# Patient Record
Sex: Female | Born: 1955 | Race: White | Hispanic: No | Marital: Married | Smoking: Former smoker
Health system: Southern US, Community
[De-identification: ages and names within clinical notes are randomized; demographics above are authoritative.]

## PROBLEM LIST (undated history)

## (undated) DIAGNOSIS — M1990 Unspecified osteoarthritis, unspecified site: Secondary | ICD-10-CM

## (undated) DIAGNOSIS — C801 Malignant (primary) neoplasm, unspecified: Secondary | ICD-10-CM

## (undated) DIAGNOSIS — E039 Hypothyroidism, unspecified: Secondary | ICD-10-CM

## (undated) DIAGNOSIS — J45909 Unspecified asthma, uncomplicated: Secondary | ICD-10-CM

## (undated) DIAGNOSIS — R0600 Dyspnea, unspecified: Secondary | ICD-10-CM

## (undated) DIAGNOSIS — D649 Anemia, unspecified: Secondary | ICD-10-CM

## (undated) DIAGNOSIS — Z8719 Personal history of other diseases of the digestive system: Secondary | ICD-10-CM

## (undated) DIAGNOSIS — F419 Anxiety disorder, unspecified: Secondary | ICD-10-CM

## (undated) DIAGNOSIS — K219 Gastro-esophageal reflux disease without esophagitis: Secondary | ICD-10-CM

## (undated) DIAGNOSIS — E049 Nontoxic goiter, unspecified: Secondary | ICD-10-CM

## (undated) DIAGNOSIS — T7840XA Allergy, unspecified, initial encounter: Secondary | ICD-10-CM

## (undated) DIAGNOSIS — F32A Depression, unspecified: Secondary | ICD-10-CM

## (undated) DIAGNOSIS — I10 Essential (primary) hypertension: Secondary | ICD-10-CM

## (undated) DIAGNOSIS — E0590 Thyrotoxicosis, unspecified without thyrotoxic crisis or storm: Secondary | ICD-10-CM

## (undated) HISTORY — PX: TOTAL HIP ARTHROPLASTY: SHX124

## (undated) HISTORY — PX: KNEE SURGERY: SHX244

## (undated) HISTORY — PX: CHOLECYSTECTOMY: SHX55

## (undated) HISTORY — PX: WISDOM TOOTH EXTRACTION: SHX21

## (undated) HISTORY — PX: HAMMER TOE SURGERY: SHX385

## (undated) HISTORY — PX: JOINT REPLACEMENT: SHX530

## (undated) HISTORY — DX: Allergy, unspecified, initial encounter: T78.40XA

## (undated) HISTORY — PX: SHOULDER SURGERY: SHX246

## (undated) HISTORY — PX: FOOT SURGERY: SHX648

---

## 2002-09-20 ENCOUNTER — Encounter: Payer: Self-pay | Admitting: Family Medicine

## 2002-09-20 ENCOUNTER — Ambulatory Visit (HOSPITAL_COMMUNITY): Admission: RE | Admit: 2002-09-20 | Discharge: 2002-09-20 | Payer: Self-pay | Admitting: Family Medicine

## 2003-02-08 ENCOUNTER — Encounter: Admission: RE | Admit: 2003-02-08 | Discharge: 2003-02-08 | Payer: Self-pay | Admitting: Orthopedic Surgery

## 2004-01-19 ENCOUNTER — Other Ambulatory Visit: Admission: RE | Admit: 2004-01-19 | Discharge: 2004-01-19 | Payer: Self-pay | Admitting: Family Medicine

## 2004-07-20 ENCOUNTER — Ambulatory Visit (HOSPITAL_COMMUNITY): Admission: RE | Admit: 2004-07-20 | Discharge: 2004-07-20 | Payer: Self-pay | Admitting: Family Medicine

## 2004-10-01 ENCOUNTER — Encounter: Admission: RE | Admit: 2004-10-01 | Discharge: 2004-10-01 | Payer: Self-pay | Admitting: Orthopedic Surgery

## 2005-02-15 ENCOUNTER — Inpatient Hospital Stay (HOSPITAL_COMMUNITY): Admission: RE | Admit: 2005-02-15 | Discharge: 2005-02-16 | Payer: Self-pay | Admitting: Orthopedic Surgery

## 2005-04-12 ENCOUNTER — Other Ambulatory Visit: Admission: RE | Admit: 2005-04-12 | Discharge: 2005-04-12 | Payer: Self-pay | Admitting: Family Medicine

## 2005-07-22 ENCOUNTER — Ambulatory Visit (HOSPITAL_COMMUNITY): Admission: RE | Admit: 2005-07-22 | Discharge: 2005-07-22 | Payer: Self-pay | Admitting: Family Medicine

## 2006-05-01 ENCOUNTER — Other Ambulatory Visit: Admission: RE | Admit: 2006-05-01 | Discharge: 2006-05-01 | Payer: Self-pay | Admitting: Family Medicine

## 2006-08-05 ENCOUNTER — Ambulatory Visit (HOSPITAL_COMMUNITY): Admission: RE | Admit: 2006-08-05 | Discharge: 2006-08-05 | Payer: Self-pay | Admitting: Family Medicine

## 2007-07-28 ENCOUNTER — Other Ambulatory Visit: Admission: RE | Admit: 2007-07-28 | Discharge: 2007-07-28 | Payer: Self-pay | Admitting: Family Medicine

## 2007-08-06 ENCOUNTER — Ambulatory Visit (HOSPITAL_COMMUNITY): Admission: RE | Admit: 2007-08-06 | Discharge: 2007-08-06 | Payer: Self-pay | Admitting: Family Medicine

## 2008-05-03 ENCOUNTER — Encounter: Admission: RE | Admit: 2008-05-03 | Discharge: 2008-05-03 | Payer: Self-pay | Admitting: Orthopedic Surgery

## 2008-07-01 ENCOUNTER — Inpatient Hospital Stay (HOSPITAL_COMMUNITY): Admission: RE | Admit: 2008-07-01 | Discharge: 2008-07-03 | Payer: Self-pay | Admitting: Orthopedic Surgery

## 2008-08-08 ENCOUNTER — Ambulatory Visit (HOSPITAL_COMMUNITY): Admission: RE | Admit: 2008-08-08 | Discharge: 2008-08-08 | Payer: Self-pay | Admitting: Family Medicine

## 2008-12-15 ENCOUNTER — Other Ambulatory Visit: Admission: RE | Admit: 2008-12-15 | Discharge: 2008-12-15 | Payer: Self-pay | Admitting: Family Medicine

## 2009-08-21 ENCOUNTER — Ambulatory Visit (HOSPITAL_COMMUNITY): Admission: RE | Admit: 2009-08-21 | Discharge: 2009-08-21 | Payer: Self-pay | Admitting: Family Medicine

## 2010-03-29 ENCOUNTER — Other Ambulatory Visit: Payer: Self-pay | Admitting: Family Medicine

## 2010-03-29 ENCOUNTER — Other Ambulatory Visit
Admission: RE | Admit: 2010-03-29 | Discharge: 2010-03-29 | Payer: Self-pay | Source: Home / Self Care | Admitting: Family Medicine

## 2010-06-12 LAB — BASIC METABOLIC PANEL
BUN: 3 mg/dL — ABNORMAL LOW (ref 6–23)
CO2: 26 mEq/L (ref 19–32)
Calcium: 8.1 mg/dL — ABNORMAL LOW (ref 8.4–10.5)
Chloride: 102 mEq/L (ref 96–112)
Creatinine, Ser: 0.69 mg/dL (ref 0.4–1.2)
GFR calc Af Amer: >60 mL/min (ref >60)
GFR calc non Af Amer: >60 mL/min (ref >60)
Glucose, Bld: 130 mg/dL — ABNORMAL HIGH (ref 70–99)
Potassium: 3.7 mEq/L (ref 3.5–5.1)
Sodium: 134 mEq/L — ABNORMAL LOW (ref 135–145)

## 2010-06-12 LAB — CBC
HCT: 34.4 % — ABNORMAL LOW (ref 36.0–46.0)
Hemoglobin: 12 g/dL (ref 12.0–15.0)
MCHC: 35 g/dL (ref 30.0–36.0)
MCV: 91 fL (ref 78.0–100.0)
Platelets: 257 10*3/uL (ref 150–400)
RBC: 3.78 MIL/uL — ABNORMAL LOW (ref 3.87–5.11)
RDW: 12.9 % (ref 11.5–15.5)
WBC: 12.4 10*3/uL — ABNORMAL HIGH (ref 4.0–10.5)

## 2010-06-13 LAB — URINALYSIS, ROUTINE W REFLEX MICROSCOPIC
Bilirubin Urine: NEGATIVE
Glucose, UA: NEGATIVE mg/dL
Ketones, ur: NEGATIVE mg/dL
Nitrite: NEGATIVE
Protein, ur: NEGATIVE mg/dL
Specific Gravity, Urine: 1.026 (ref 1.005–1.030)
Urobilinogen, UA: 0.2 mg/dL (ref 0.0–1.0)
pH: 5 (ref 5.0–8.0)

## 2010-06-13 LAB — BASIC METABOLIC PANEL
BUN: 14 mg/dL (ref 6–23)
CO2: 28 mEq/L (ref 19–32)
Calcium: 9.6 mg/dL (ref 8.4–10.5)
Chloride: 103 mEq/L (ref 96–112)
Creatinine, Ser: 0.68 mg/dL (ref 0.4–1.2)
GFR calc Af Amer: >60 mL/min (ref >60)
GFR calc non Af Amer: >60 mL/min (ref >60)
Glucose, Bld: 90 mg/dL (ref 70–99)
Potassium: 3.8 mEq/L (ref 3.5–5.1)
Sodium: 139 mEq/L (ref 135–145)

## 2010-06-13 LAB — CBC
HCT: 39.3 % (ref 36.0–46.0)
Hemoglobin: 13.9 g/dL (ref 12.0–15.0)
MCHC: 35.2 g/dL (ref 30.0–36.0)
MCV: 90.4 fL (ref 78.0–100.0)
Platelets: 296 10*3/uL (ref 150–400)
RBC: 4.35 MIL/uL (ref 3.87–5.11)
RDW: 13 % (ref 11.5–15.5)
WBC: 10 10*3/uL (ref 4.0–10.5)

## 2010-06-13 LAB — DIFFERENTIAL
Basophils Absolute: 0.1 10*3/uL (ref 0.0–0.1)
Basophils Relative: 1 % (ref 0–1)
Eosinophils Absolute: 0.4 10*3/uL (ref 0.0–0.7)
Eosinophils Relative: 4 % (ref 0–5)
Lymphocytes Relative: 23 % (ref 12–46)
Lymphs Abs: 2.3 10*3/uL (ref 0.7–4.0)
Monocytes Absolute: 0.9 10*3/uL (ref 0.1–1.0)
Monocytes Relative: 9 % (ref 3–12)
Neutro Abs: 6.5 10*3/uL (ref 1.7–7.7)
Neutrophils Relative %: 64 % (ref 43–77)

## 2010-06-13 LAB — TYPE AND SCREEN
ABO/RH(D): O NEG
Antibody Screen: NEGATIVE

## 2010-06-13 LAB — URINE MICROSCOPIC-ADD ON

## 2010-06-13 LAB — PROTIME-INR
INR: 1 (ref 0.00–1.49)
Prothrombin Time: 12.8 seconds (ref 11.6–15.2)

## 2010-06-13 LAB — ABO/RH: ABO/RH(D): O NEG

## 2010-06-13 LAB — APTT: aPTT: 26 seconds (ref 24–37)

## 2010-07-13 ENCOUNTER — Encounter (HOSPITAL_COMMUNITY)
Admission: RE | Admit: 2010-07-13 | Discharge: 2010-07-13 | Disposition: A | Discharge status: Home / Self Care | Payer: BC Managed Care – PPO | Source: Ambulatory Visit | Attending: Orthopedic Surgery | Admitting: Orthopedic Surgery

## 2010-07-13 LAB — URINALYSIS, ROUTINE W REFLEX MICROSCOPIC
Ketones, ur: NEGATIVE mg/dL
Nitrite: NEGATIVE
Protein, ur: NEGATIVE mg/dL

## 2010-07-13 LAB — CBC
MCV: 91.7 fL (ref 78.0–100.0)
Platelets: 317 10*3/uL (ref 150–400)
RBC: 4.57 MIL/uL (ref 3.87–5.11)
RDW: 13.6 % (ref 11.5–15.5)
WBC: 9 10*3/uL (ref 4.0–10.5)

## 2010-07-13 LAB — APTT: aPTT: 26 seconds (ref 24–37)

## 2010-07-13 LAB — BASIC METABOLIC PANEL
BUN: 14 mg/dL (ref 6–23)
Calcium: 9.8 mg/dL (ref 8.4–10.5)
Chloride: 100 mEq/L (ref 96–112)
Creatinine, Ser: 0.72 mg/dL (ref 0.4–1.2)
GFR calc Af Amer: >60 mL/min (ref >60)

## 2010-07-13 LAB — PROTIME-INR
INR: 0.86 (ref 0.00–1.49)
Prothrombin Time: 11.9 seconds (ref 11.6–15.2)

## 2010-07-13 LAB — DIFFERENTIAL
Basophils Absolute: 0.1 10*3/uL (ref 0.0–0.1)
Basophils Relative: 1 % (ref 0–1)
Eosinophils Absolute: 0.4 10*3/uL (ref 0.0–0.7)
Eosinophils Relative: 4 % (ref 0–5)
Neutrophils Relative %: 64 % (ref 43–77)

## 2010-07-13 LAB — SURGICAL PCR SCREEN: Staphylococcus aureus: NEGATIVE

## 2010-07-13 LAB — TYPE AND SCREEN

## 2010-07-17 NOTE — Op Note (Signed)
NAMESHERLEY, MCKENNEY               ACCOUNT NO.:  [id number]   MEDICAL RECORD NO.:  [id number]          PATIENT TYPE:  INP   LOCATION:  2899                         FACILITY:  [facility]   PHYSICIAN:  Almedia Balls. Ranell Patrick, M.D. DATE OF BIRTH:  [birth year]   DATE OF PROCEDURE:  07/01/2008  DATE OF DISCHARGE:                               OPERATIVE REPORT   PREOPERATIVE DIAGNOSIS:  Right shoulder end-stage osteoarthritis.   POSTOPERATIVE DIAGNOSIS:  Right shoulder end-stage osteoarthritis.   PROCEDURE PERFORMED:  Right total shoulder replacement using DePuy  Global Advantage system.   ATTENDING SURGEON:  Almedia Balls. Ranell Patrick, MD   ASSISTANT:  Donnie Coffin. Dixon, PA-C   ANESTHESIA:  General anesthesia plus interscalene block anesthesia was  used.   ESTIMATED BLOOD LOSS:  Minimal.   FLUID REPLACEMENT:  1800 mL crystalloid.   INSTRUMENT COUNTS:  Correct.   COMPLICATIONS:  None.   Perioperative antibiotics were given.   URINE OUTPUT:  350 mL.   INDICATIONS:  The patient is a 55 year old female with a worse history  of bilateral shoulder arthritis.  She has had a prior shoulder  arthroplasty in the left and done well with that.  She presents now with  disabling pain and functional loss desiring shoulder replacement on the  right.  Informed consent was obtained.   DESCRIPTION OF PROCEDURE:  After an adequate level of anesthesia was  achieved, the patient was positioned in the modified beach chair  position.  Right shoulder was examined under Anesthesia.  The patient  had about of 10 degrees of external rotation with firm end point,  internal rotation to the abdomen barely, forward elevation about 100  degrees with stiffness.  At this point, we sterilely prepped and draped  the shoulder in the usual manner.  We entered the shoulder through a  deltopectoral approach.  We preinfiltrated the skin with 0.25% Marcaine  with epinephrine.  We identified the deltopectoral interval in the  cephalic  vein.  We took cephalic vein laterally with the deltoid.  There  were some large crossing vessels that we ligated using suture ligature.  At this point, we went ahead and released the upper portion of the  pectoralis.  We then mobilized the conjoint tendon medially exposing the  3 sisters.  We then went ahead and thoroughly tied off those 3 sisters  and then did a release of the subscapularis off the lesser tuberosity.  We placed two #2  FiberWire sutures in a modified Mason-Allen suture  technique into the free end of the tendon.  We released that off the  capsule inferiorly and then off the humeral neck.  We progressively  externally rotated the humeral neck until we could fully deliver that  into the wound and then performed her neck cut osteotomies using  oscillating saw with the neck cut guide.  We cut the humerus in about 10  degrees of external rotation giving [place] 10 degrees of retroversion on her  cut.  At this point, we prepared her humerus initially reaming up to a  size of 10.  We then went ahead and started  broaching with the size 8  then went to a size 10.  The interesting thing in her situation was that  as we fully impacted the 10 broach down to level with her osteotomy, the  broach collar was kicked into varus.  We looked at the x-rays and it  appeared that the cortex came in fairly aggressively from lateral to  medial coming off the greater tuberosity and then she narrowed down  distally, and I think this threw [place] into  varus. Thus we went ahead and  selected a size 8.  We could position that perfectly and will just  cement that in place.  With the broach in place protecting her proximal  humerus, we went ahead and did a sequential release 360 degrees around  the glenoid.  We were able to gain good access to the glenoid and then  went ahead and drilled a center hole feeling for the inferior pillar and  marking her glenoid appropriately with 12 o'clock, 6 o'clock, 3 and 9   positions and her central hole was directly in the middle of that. We  then sized it to size 40, we reamed down to bleeding bone and then  placed the anchor-peg drill holes in place, and then placed her for  trial 40 anchor-peg glenoid in there.  This fit nicely and had good  support all the way round.  We removed excess osteophytes around the  edge using a rongeur.  At this point, we went ahead and cemented our  stem into place and placing #2 FiberWire through drill holes for a  subscapular repair and then we went ahead and cemented her component in  the place with careful positioning both in 10 degrees of retroversion as  well as making sure that stent did not drift into varus at all.  Once  the cement was allowed to harden, we went ahead then cemented her  peripheral Pegs on her glenoid, removed her trial component, and we  cemented her anchor peg glenoid, did not cement the central hole to  allow for ingrowth that again had good support and was nice and flushed  with the native glenoid.  At this point, we trialed again with the 40.  We had trialed before with a 40, 18 head and that fitted perfectly and  again we trialed with 40, 18 and this gave [place] good offset.  We then went  ahead and thoroughly irrigated, checked for excess cement and then  repaired her subscapularis anatomically with #2 FiberWire sutures.  We  placed 3 of those sutures up in the rotator interval and had an anatomic  repair.  It was very strong, we tenodesed of the biceps with 0 Vicryl  suture and then thoroughly irrigated closing the deltopectoral interval  with 0 Vicryl suture followed by 2-0 Vicryl subcutaneous closure and 4-0  Monocryl for skin.  Steri-Strips were applied followed by a sterile  dressing.  The patient tolerated the surgery well.      Almedia Balls. Ranell Patrick, M.D.  Electronically Signed     SRN/MEDQ  D:  07/01/2008  T:  07/02/2008  Job:  [id number]

## 2010-07-17 NOTE — H&P (Signed)
[name], [name]               ACCOUNT NO.:  [id number]   MEDICAL RECORD NO.:  [id number]          PATIENT TYPE:  INP   LOCATION:  NA                           FACILITY:  [facility]   PHYSICIAN:  Almedia Balls. Ranell Patrick, M.D. DATE OF BIRTH:  February 02, 1956   DATE OF ADMISSION:  DATE OF DISCHARGE:                              HISTORY & PHYSICAL   CHIEF COMPLAINT:  Right shoulder pain.   HISTORY OF PRESENT ILLNESS:  The patient is a 55 year old female with  worsening right shoulder pain secondary to osteoarthritis.  The patient  has elected to have a right total shoulder arthroplasty by Dr. Malon Kindle.   PAST MEDICAL HISTORY:  1. Asthma.  2. Hypertension.   Family medical history is diabetes, coronary artery disease, and  hypertension.   SOCIAL HISTORY:  The patient is married.  Occasional alcohol use.  Does  not smoke or use recreational drugs.  The patient is of Dr. Kevan Ny'.   DRUG ALLERGIES:  None.   CURRENT MEDICATIONS:  Advair, Synthroid, hydrochlorothiazide, birth  control pills, Zyrtec, and albuterol.  Please check the dosages with the  patient.   REVIEW OF SYSTEMS:  Pain on range of motion with her right shoulder.   PHYSICAL EXAMINATION:  VITAL SIGNS:  Pulse 90, respirations 16, blood  pressure 142/92.  GENERAL:  The patient is a healthy-appearing 55 year old female in no  acute distress, pleasant, and affect, oriented x3.  HEAD AND NECK:  Cranial nerves II through XII grossly intact.  She has  full range of motion without any tenderness of cervical, thoracic, or  lumbar spines.  CHEST:  She has active breath sounds bilaterally.  No wheezes, rhonchi,  or rales.  HEART:  Regular rate and rhythm.  No murmur.  ABDOMEN:  Nontender, nondistended with active bowel sounds.  EXTREMITIES:  Moderate tenderness in the right shoulder, especially with  range of motion, forward flexion to about 80 degrees, external rotation  20 degrees, internal rotation without any crepitus.  NEUROLOGICAL:  She is intact.  SKIN:  No edema or rashes.   X-rays show end-stage osteoarthritis of the right shoulder.   IMPRESSION:  Right shoulder end-stage osteoarthritis.   PLAN OF ACTION:  She will have a right total shoulder arthroplasty by  Dr. Malon Kindle.      Thomas B. Durwin Nora, P.A.      Almedia Balls. Ranell Patrick, M.D.  Electronically Signed    TBD/MEDQ  D:  06/21/2008  T:  06/22/2008  Job:  [id number]

## 2010-07-17 NOTE — Discharge Summary (Signed)
NAMELANEICE, MENEELY               ACCOUNT NO.:  [id number]   MEDICAL RECORD NO.:  [id number]          PATIENT TYPE:  INP   LOCATION:  5007                         FACILITY:  [facility]   PHYSICIAN:  Almedia Balls. Ranell Patrick, M.D. DATE OF BIRTH:  1955-11-30   DATE OF ADMISSION:  07/01/2008  DATE OF DISCHARGE:  07/03/2008                               DISCHARGE SUMMARY   ADMITTING DIAGNOSIS:  Right shoulder end-stage osteoarthritis.   DISCHARGE DIAGNOSIS:  Right shoulder end-stage osteoarthritis.   PROCEDURE PERFORMED:  Right total shoulder replacement performed on  July 01, 2008 by Dr. Ranell Patrick.   CONSULTANT SERVICE:  Occupational Therapy for discharge planning.   HISTORY OF PRESENT ILLNESS:  The patient is a 55 year old female with a  history of worsening right shoulder pain secondary to end-stage  arthritis.  The patient had prior left shoulder placement done well.  She presents now for treatment to restore function and eliminate pain.  For further details of the patient's past medical history and physical  examination, please see the hospital record.   HOSPITAL COURSE:  The patient was admitted to Orthopedics on July 01, 2008.  The patient underwent uncomplicated right total shoulder  arthroplasty.  The patient was transferred to the floor postop, doing  well.  The patient was on a regular diet, tolerating her p.o. meds well  and pain was well-controlled on p.o. meds on day of discharge.  The  patient is discharged on Dilaudid, tramadol, Robaxin, and Dulcolax  suppositories p.r.n.  She is also on her preadmission medications with  exception of the hydrocodone which she will refrain from.  She is  instructed on daily dry dressing change keeping the wound dry for 7 days  with followup with Orthopedics in 2 weeks.      Almedia Balls. Ranell Patrick, M.D.  Electronically Signed     SRN/MEDQ  D:  07/03/2008  T:  07/03/2008  Job:  [id number]

## 2010-07-20 ENCOUNTER — Inpatient Hospital Stay (HOSPITAL_COMMUNITY)
Admission: RE | Admit: 2010-07-20 | Discharge: 2010-07-23 | DRG: 209 | Disposition: A | Discharge status: Home / Self Care | Payer: BC Managed Care – PPO | Source: Ambulatory Visit | Attending: Orthopedic Surgery | Admitting: Orthopedic Surgery

## 2010-07-20 DIAGNOSIS — I10 Essential (primary) hypertension: Secondary | ICD-10-CM | POA: Diagnosis present

## 2010-07-20 DIAGNOSIS — E039 Hypothyroidism, unspecified: Secondary | ICD-10-CM | POA: Diagnosis present

## 2010-07-20 DIAGNOSIS — M1710 Unilateral primary osteoarthritis, unspecified knee: Principal | ICD-10-CM | POA: Diagnosis present

## 2010-07-20 NOTE — Op Note (Signed)
NAMENETA, UPADHYAY               ACCOUNT NO.:  [id number]   MEDICAL RECORD NO.:  [id number]          PATIENT TYPE:  INP   LOCATION:  0004                         [facility]ACILITY[facility]   PHYSICIAN:  Almedia Balls. Ranell Patrick, M.D. DATE OF BIRTH:  February 28, 1956   DATE OF PROCEDURE:  02/15/2005  DATE OF DISCHARGE:                                 OPERATIVE REPORT   PREOPERATIVE DIAGNOSES:  Left shoulder osteoarthritis, severe.   POSTOPERATIVE DIAGNOSES:  Left shoulder osteoarthritis, severe.   PROCEDURE:  Left shoulder hemiarthroplasty using DePuy global prosthesis  with a size 10 body and a 38 x 18 head.   SURGEON:  Almedia Balls. Ranell Patrick, M.D.   ASSISTANT:  Donnie Coffin. Durwin Nora, P.A.   ANESTHESIA:  General anesthesia was used plus an interscalene block.   FLUIDS REPLACED:  1500 mL.   ESTIMATED BLOOD LOSS:  200 mL.   INSTRUMENT COUNT:  Correct.   COMPLICATIONS:  None.   ANTIBIOTICS:  Perioperative antibiotics were given.   INDICATIONS FOR PROCEDURE:  The patient is a 55 year old female with a  history of worsening left shoulder pain. She is noted to have end-stage  arthritis on x-ray. She has severely limited range of motion and function as  a result of her arthritis and presents now for operative shoulder  arthroplasty to relieve pain and improve shoulder function. We discussed  with the patient the options of management and she elected to proceed with  surgical treatment. Informed consent was obtained.   DESCRIPTION OF PROCEDURE:  After an adequate level of anesthesia was  achieved, the patient was positioned in modified beach position. All  neurovascular structures padded appropriately. The left shoulder was  sterilely prepped and draped in the usual manner. A deltopectoral approach  was created after a sterile prep and drape. Starting at the coracoid process  and extending down to the anterior attachment of the deltoid, dissection was  carried sharply down through the subcutaneous tissues. The  cephalic vein  identified and taken laterally with the deltoid. The upper 1 cm peck was  released off the humerus, conjoined tendon identified and taken medially.  This shows the subscapularis which was taken off the lesser tuberosity. We  attempted to leave a little cuff of soft tissue although not a big one as  the patient's external rotation was limited to about 5 degrees if that. Once  the subscap was off, we placed definitive Mason-Allen sutures, these were #2  FiberWire sutures in the subscap and we freed it up off the capsule. We  performed an anterior capsulectomy. Once this was performed, we  progressively externally rotated and delivered the head. We placed a freer  elevator over the top of the head underneath the biceps tendon and the  rotator cuff. We then performed a neck cut using the appropriate neck cut  guide and placing the elbow at approximately 20 degrees of external  rotation. Once this was done, we removed the excess osteophytes off the  humerus, we also removed the large loose body from the posterior aspect of  the shoulder capsule as well as the inferior aspect of  the shoulder capsule  and these were removed. The remainder of the capsule was swept with my  finger and was felt to be free of any large loose bodies. The posterior  capsule seemed to be fairly patulous and I did not feel a need to release  that. Following this, we repaired the humerus with sequential reaming  starting at a size 6 up to a size 10 and then broached up to a size 10 and  with the trial 10 broach in place, we placed a 44 x 18 head on. We did not  __________ for coverage. This seemed to be an appropriate size. We then  reduced the shoulder and noted outstanding soft tissue tension with being  able to inferiorly translate 50% of the glenoid diameter and posteriorly  translate 50% of the glenoid diameter. We removed the trial implants and  then impacted using some bone grafting around the upper  portion of the  humerus. I impacted the real 10 stem in place. We again trialed with a 44 x  18 trial and again had nice coverage and good soft tissue tension. We  removed that trial head and then impacted the real head in place, a 44 x 18.  We then went to repair the subscap and initially made some drill holes in  the lesser tuberosity and placed the sutures through that tying the upper  two sutures. The bone bridge between the first 2 sutures actually broke off  on the lesser tuberosity thus we needed to go ahead and elevate the biceps  tendon out of the bicipital groove and then place drill holes in the floor  of the bicipital groove and bring the sutures through that. Once we were  able to do that, we had nice secure subscap repair with multiple horizontal  mattress sutures through either the lesser tuberosity or the bicipital  groove floor. The biceps tendon was laid back on top of those sutures and  was in its anatomic location. At this point, we went ahead and irrigated the  wound thoroughly and then repaired the deltopectoral interval with  interrupted #1 Vicryl suture followed by 2-0 Vicryl subcutaneous and running  4-0 Monocryl for skin. Steri-Strips were applied followed by a sterile  dressing. The patient tolerated surgery well.           ______________________________  Almedia Balls. Ranell Patrick, M.D.     SRN/MEDQ  D:  02/15/2005  T:  02/18/2005  Job:  [id number]

## 2010-07-20 NOTE — H&P (Signed)
[name], [name]               ACCOUNT NO.:  [id number]   MEDICAL RECORD NO.:  [id number]          PATIENT TYPE:   LOCATION:                                 FACILITY:   PHYSICIAN:  Maisie Fus B. Dixon, P.A.  DATE OF BIRTH:  09/23/1955   DATE OF ADMISSION:  02/15/2005  DATE OF DISCHARGE:                                HISTORY & PHYSICAL   CHIEF COMPLAINT:  Left shoulder pain.   HISTORY OF PRESENT ILLNESS:  The patient has had left shoulder pain for  greater than 2 years with worsening symptoms.  The patient is a left hand  dominant female and her symptoms have been refractory to conservative  methods such as cortisone and physical therapy.   ALLERGIES:  No known drug allergies.   MEDICATIONS:  1.  Synthroid 0.1 mg daily.  2.  Hydrochlorothiazide 12.5 mg daily.  3.  Ortho-Novum 7/7/7 daily.  4.  Advair 250/50 mg daily.  5.  Vicodin 5/500 one tablet q.4-6 hours p.r.n. pain.  6.  Robaxin 500 mg q.6 hours p.r.n. pain.   PAST MEDICAL HISTORY:  Asthma, hypertension, and hypothyroidism.   FAMILY HISTORY:  Diabetes, coronary artery disease, and hypertension.   SOCIAL HISTORY:  Patient of Duncan Dull, M.D.  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   REVIEW OF SYSTEMS:  Negative except for pain with range of motion in the  left shoulder.   PHYSICAL EXAMINATION:  VITAL SIGNS:  Pulse 90, respirations 18, blood  pressure 142/92.  GENERAL:  The patient is a healthy appearing 55 year old female in no acute  distress.  Pleasant mood and affect.  Alert and oriented x3.  HEENT:  NECK:  Cranial nerves II-XII grossly intact.  She has full range of  motion of the cervical spine with no tenderness to the cervical, thoracic,  or lumbar spines.  She has negative Spurling's and Lhermitte's test.  CHEST:  Active breath sounds bilaterally with no wheezes, rhonchi, or rales.  HEART:  Regular rate and rhythm, no murmur.  ABDOMEN:  Nontender and nondistended with active bowel  sounds in all four  quadrants.  EXTREMITIES:  Marked tenderness and crepitus with the left shoulder.  She  has forward flexion up to 90 degrees, external rotation to 15 degrees and  internal rotation to her abdomen.  Neurovascularly she is intact.  Skin  intact.  She has grip strength 5/5 bilaterally.  Strength of biceps and  triceps is 5/5 bilaterally.  Legs are unremarkable with strength 5/5  bilaterally and deep tendon reflexes are 2+ and equal.  No rashes were seen  on her skin examination.   X-ray shows left shoulder osteoarthritis which is severe.   IMPRESSION:  Left shoulder osteoarthritis, severe.   PLAN:  Left shoulder hemiarthroplasty by Almedia Balls. Ranell Patrick, M.D. on February 15, 2005.      Thomas B. Ferne Coe.     TBD/MEDQ  D:  01/29/2005  T:  01/29/2005  Job:  [id number]

## 2010-07-21 LAB — CBC
HCT: 32.5 % — ABNORMAL LOW (ref 36.0–46.0)
Hemoglobin: 11 g/dL — ABNORMAL LOW (ref 12.0–15.0)
MCHC: 33.8 g/dL (ref 30.0–36.0)
MCV: 91.8 fL (ref 78.0–100.0)

## 2010-07-21 LAB — BASIC METABOLIC PANEL
BUN: 8 mg/dL (ref 6–23)
CO2: 30 mEq/L (ref 19–32)
Calcium: 8.4 mg/dL (ref 8.4–10.5)
Glucose, Bld: 102 mg/dL — ABNORMAL HIGH (ref 70–99)
Potassium: 4.1 mEq/L (ref 3.5–5.1)
Sodium: 137 mEq/L (ref 135–145)

## 2010-07-22 LAB — BASIC METABOLIC PANEL
CO2: 29 mEq/L (ref 19–32)
Calcium: 8.7 mg/dL (ref 8.4–10.5)
Creatinine, Ser: 0.61 mg/dL (ref 0.4–1.2)
Glucose, Bld: 155 mg/dL — ABNORMAL HIGH (ref 70–99)

## 2010-07-22 LAB — CBC
HCT: 34.9 % — ABNORMAL LOW (ref 36.0–46.0)
Hemoglobin: 11.7 g/dL — ABNORMAL LOW (ref 12.0–15.0)
MCH: 30.6 pg (ref 26.0–34.0)
MCHC: 33.5 g/dL (ref 30.0–36.0)

## 2010-07-23 NOTE — Op Note (Signed)
NAMEIZORA, BENN               ACCOUNT NO.:  [id number]  MEDICAL RECORD NO.:  [id number]           PATIENT TYPE:  I  LOCATION:  5024                         FACILITY:  [facility]  PHYSICIAN:  Madlyn Frankel. Charlann Boxer, M.D.  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  SURGEON:  Madlyn Frankel. Charlann Boxer, MD  ASSISTANTS: 1. Almedia Balls. Ranell Patrick, MD 2. Jaquelyn Bitter. Chabon, PA-C  ANESTHESIA:  General.  SPECIMENS:  None.  COMPLICATIONS:  None.  DRAINS:  One Hemovac.  TOURNIQUET TIME:  48 minutes at 250 mmHg.  BLOOD LOSS:  About 100 mL.  INDICATIONS FOR PROCEDURE:  Ms. Freedman is a very pleasant 55 year old patient of Dr. Malon Kindle with a remote history of a patellar realignment with tibial tubercle osteotomy and realignment procedure for patellofemoral arthritis.  She had progressively failed conservative measures including series of viscous supplementation, cortisone injections, activity modifications and standard medications.  She ultimately decided to proceed with arthroplasty.  Dr. Ranell Patrick had reviewed with her the risks and benefits of procedure in the fact that he wished to have assistance indication for myself.  Risks and benefits reviewed.  Consent was obtained for the benefit of pain relief.  Standard risks of infection, DVT, component failure, need for revision surgery.  All have discussed and reviewed in addition to the complications related to her previous surgery.  PROCEDURE IN DETAIL:  The patient was brought to operative  theater. Once adequate anesthesia and preoperative antibiotics, Ancef administered.  She was positioned supine with left thigh tourniquet placed.  The left lower extremity was then examined under anesthesia and found to have hyperextension of at least 5-7 degrees as well as just global slight instability as we came through flexion.  She was passively correctable out of her valgus to a neutral alignment as she had predominantly anterolateral disease radiographically.  At this point, left lower extremity was prepped and draped in a sterile fashion.  A time-out was performed identifying the patient, planned procedure and extremity.  She was noted to have a large S-type incision over the anterior aspect of the knee from her previously realignment procedure.  I basically utilized this and incision extended at more proximal.  We excised the old scar.  Leg was exsanguinated, tourniquet elevated to 250 mmHg.  The old incision was utilized excising old scar and extending it proximally for exposure purposes.  Soft tissue planes created.  Extensor mechanism visualized.  A median arthrotomy was then made allowing for lateral subluxation.  Following initial exposure and debridement some of the synovium fat in the anterior and medial aspect of the knee noted severe degenerative change within the knee laterally and patellofemoral with an associated beefy red synovitis.  The attention was directed to patella, precut  measurement of the patella was 20 mm.  I resected down to about 14 mm and used a 35 patellar button restore height.  This covered the cut surface well.  Lug holes were drilled and a metal shim was placed.  I also confirmed that the patella height was restored about 23 mm for female that is anatomic.  Attention was now directed to the femur.  The femoral canal was opened with a drill and irrigated to try to prevent fat emboli.  Using the intramedullary rod at 5 degrees of valgus and 9 mm  of bone resected off the distal femur due to her preoperative hyperextension passively. Following this cut, the tibia was subluxated anteriorly, further cruciate stumps and meniscus was debrided.  Using extramedullary guide based on her anatomy and her radiographs, 4 mm of bone was resected off the proximal and lateral tibia.  The most affected side, however, still radiographically relatively high.  This was then felt to be a conservative cut.  Following this cut, we confirmed the gap was stable at this point with a 10-mm insert with the knee in extension medially and laterally as well as confirmed that the cut was perpendicular in the coronal plane.  At this point, I sized the femur to be a size 4.  The size 4 rotation block was then pinned onto the distal femur anterior reference using the C-clamp to set rotation.  Following pinning this into place, the former cutting block was pinned into position.  Once it was oriented and flushed with the anterior cortex, the anterior- posterior chamfer cuts were made without difficulty, no notching.  The final box cut was made up to the lateral aspect of distal femur.  At this point, the tibia was subluxated again.  A final size 3 tibial tray fit best on the proximal tibia to reestablish anatomic boundaries of the knee.  She was noted to have a slightly externally rotated tibia from previous procedures.  The tibial tray was pinned in position, drilled and keel punched.  A trial reduction was now carried out with 4 narrow femur, 3 tibia and 10- mm insert.  With this, I felt the knee came to full extension and had stable ligaments from extension into flexion and the patella tracked through the trochlea with a little bit of lift-off and deep flexion.  At this point, all trial components were removed.  The final components were opened including the polyethylene insert.  The knee was irrigated with normal saline solution pulse lavage.  The cement  was mixed.  The final components were then cemented onto clean and dried cut surfaces. The bone and the knee was brought to extension with a 10-mm insert and extruded cement was removed.  Tourniquet was let down after 48 minutes at 250 mmHg.  Once the cement had cured and excessive cement was removed throughout the knee, the final 10-mm insert was chosen and placed into the knee.  We re-irrigated the knee at this point with normal saline solution.  At this point, attention was directed, reapproximated, extensor mechanism which I normally would do in flexion; however, based on the fact that her previous realignment left that was very thin anterior patella tendon, extensor tissue.  I focused a lot of the tension on the reapproximating this.  Not only was reapproximated using interrupted suture, did a running suture to try to tubularized this area to provide some extra tendinous tissue for healing.  The remainder of the extensor mechanism was reapproximated in typical  fashion using #1 Vicryl using a combination of interrupted figure-of- eight sutures as well as running sutures.  The remaining wound at this point was closed with 2-0 Vicryl and staples on the skin due to some of tension.  The skin was cleaned, dried and dressed sterilely using Mepilex dressings and bulky wrap.  She was then brought to recovery room in stable condition tolerating the procedure well.     Madlyn Frankel Charlann Boxer, M.D.     MDO/MEDQ  D:  07/20/2010  T:  07/21/2010  Job:  [id number]  Electronically Signed by Durene Romans M.D. on 07/23/2010 01:02:08 PM

## 2010-08-24 ENCOUNTER — Other Ambulatory Visit (HOSPITAL_COMMUNITY): Payer: Self-pay | Admitting: Family Medicine

## 2010-08-24 DIAGNOSIS — Z1231 Encounter for screening mammogram for malignant neoplasm of breast: Secondary | ICD-10-CM

## 2010-09-04 ENCOUNTER — Ambulatory Visit (HOSPITAL_COMMUNITY)
Admission: RE | Admit: 2010-09-04 | Discharge: 2010-09-04 | Disposition: A | Discharge status: Home / Self Care | Payer: BC Managed Care – PPO | Source: Ambulatory Visit | Attending: Family Medicine | Admitting: Family Medicine

## 2010-09-04 DIAGNOSIS — Z1231 Encounter for screening mammogram for malignant neoplasm of breast: Secondary | ICD-10-CM | POA: Insufficient documentation

## 2010-09-21 NOTE — H&P (Signed)
  [name], [name]               ACCOUNT NO.:  [id number]  MEDICAL RECORD NO.:  [id number]           PATIENT TYPE:  I  LOCATION:  [place]                         FACILITY:  [facility]  PHYSICIAN:  Almedia Balls. Ranell Patrick, M.D. DATE OF BIRTH:  04-02-1955  DATE OF ADMISSION:  07/10/2010 DATE OF DISCHARGE:                             HISTORY & PHYSICAL   CHIEF COMPLAINT:  Left knee pain.  This is a patient 55 year old female with worsening left knee pain secondary to end-stage osteoarthritis and deformity.  The patient was elected to have a total knee arthroplasty due to worsening pain.  PAST MEDICAL HISTORY:  Hypertension, hypothyroidism.  FAMILY MEDICAL HISTORY:  Hypertension.  SOCIAL HISTORY:  The patient has occasional alcohol use.  Does not smoke.  Patient of Dr. Shaune Pollack.  The patient does have an intolerance PERCOCET.  Medications currently are hydrochlorothiazide, Zoloft, birth control pills, Synthroid, and Advair.  Review of systems showed pain on range of motion of the left knee with palpable click.  PHYSICAL EXAMINATION:  Pulse 74, respirations 16, blood pressure 140/78. The patient is a healthy-appearing 55 year old female in no acute distress.  Pleasant mood and affect, alert and oriented x3.  Examination of the head and neck shows cranial nerves II through XII are grossly intact, shows full range of motion of the cervical, thoracic, and lumbar spines.  The neck shows full range of motion without any tenderness. Chest was active breath sounds bilaterally.  No wheezes, rhonchi, or rales.  Heart shows regular rate and rhythm.  No murmur.  Abdomen is nontender and nondistended.  Extremities show moderate tenderness of the left knee with medial joint line tenderness.  No popliteal mass.  Calves nontender.  No pedal edema.  She does have some mildly antalgic gait but no rashes or edema.  X-rays show end-stage osteoarthritis of the left knee.  IMPRESSION:  End-stage  osteoarthritis, left knee.  PLAN OF ACTION:  Left total knee arthroplasty by Dr. Malon Kindle and Dr. Durene Romans.     [name], P.A.   ______________________________ Almedia Balls. Ranell Patrick, M.D.    TBD/MEDQ  D:  07/10/2010  T:  07/10/2010  Job:  [id number]  Electronically Signed by Standley Dakins P.A. on 08/02/2010 08:30:06 AM Electronically Signed by Malon Kindle  on 09/21/2010 12:04:26 AM

## 2011-02-13 ENCOUNTER — Other Ambulatory Visit: Payer: Self-pay | Admitting: Gastroenterology

## 2011-09-04 ENCOUNTER — Other Ambulatory Visit (HOSPITAL_COMMUNITY): Payer: Self-pay | Admitting: Family Medicine

## 2011-09-04 DIAGNOSIS — Z1231 Encounter for screening mammogram for malignant neoplasm of breast: Secondary | ICD-10-CM

## 2011-09-06 ENCOUNTER — Ambulatory Visit (HOSPITAL_COMMUNITY)
Admission: RE | Admit: 2011-09-06 | Discharge: 2011-09-06 | Disposition: A | Discharge status: Home / Self Care | Payer: BC Managed Care – PPO | Source: Ambulatory Visit | Attending: Family Medicine | Admitting: Family Medicine

## 2011-09-06 DIAGNOSIS — Z1231 Encounter for screening mammogram for malignant neoplasm of breast: Secondary | ICD-10-CM | POA: Insufficient documentation

## 2012-09-29 ENCOUNTER — Other Ambulatory Visit (HOSPITAL_COMMUNITY): Payer: Self-pay | Admitting: Family Medicine

## 2012-09-29 DIAGNOSIS — Z1231 Encounter for screening mammogram for malignant neoplasm of breast: Secondary | ICD-10-CM

## 2012-10-09 ENCOUNTER — Ambulatory Visit (HOSPITAL_COMMUNITY)
Admission: RE | Admit: 2012-10-09 | Discharge: 2012-10-09 | Disposition: A | Discharge status: Home / Self Care | Payer: BC Managed Care – PPO | Source: Ambulatory Visit | Attending: Family Medicine | Admitting: Family Medicine

## 2012-10-09 DIAGNOSIS — Z1231 Encounter for screening mammogram for malignant neoplasm of breast: Secondary | ICD-10-CM | POA: Insufficient documentation

## 2013-06-19 ENCOUNTER — Emergency Department (HOSPITAL_COMMUNITY): Payer: BC Managed Care – PPO

## 2013-06-19 ENCOUNTER — Encounter (HOSPITAL_COMMUNITY): Payer: Self-pay | Admitting: Emergency Medicine

## 2013-06-19 ENCOUNTER — Emergency Department (HOSPITAL_COMMUNITY)
Admission: EM | Admit: 2013-06-19 | Discharge: 2013-06-19 | Disposition: A | Discharge status: Home / Self Care | Payer: BC Managed Care – PPO | Attending: Emergency Medicine | Admitting: Emergency Medicine

## 2013-06-19 DIAGNOSIS — S069X9A Unspecified intracranial injury with loss of consciousness of unspecified duration, initial encounter: Secondary | ICD-10-CM

## 2013-06-19 DIAGNOSIS — Y9301 Activity, walking, marching and hiking: Secondary | ICD-10-CM | POA: Insufficient documentation

## 2013-06-19 DIAGNOSIS — W108XXA Fall (on) (from) other stairs and steps, initial encounter: Secondary | ICD-10-CM | POA: Insufficient documentation

## 2013-06-19 DIAGNOSIS — R296 Repeated falls: Secondary | ICD-10-CM | POA: Insufficient documentation

## 2013-06-19 DIAGNOSIS — S2239XA Fracture of one rib, unspecified side, initial encounter for closed fracture: Secondary | ICD-10-CM

## 2013-06-19 DIAGNOSIS — Y92009 Unspecified place in unspecified non-institutional (private) residence as the place of occurrence of the external cause: Secondary | ICD-10-CM | POA: Insufficient documentation

## 2013-06-19 DIAGNOSIS — W19XXXA Unspecified fall, initial encounter: Secondary | ICD-10-CM

## 2013-06-19 DIAGNOSIS — I10 Essential (primary) hypertension: Secondary | ICD-10-CM | POA: Insufficient documentation

## 2013-06-19 DIAGNOSIS — Z79899 Other long term (current) drug therapy: Secondary | ICD-10-CM | POA: Insufficient documentation

## 2013-06-19 DIAGNOSIS — S069XAA Unspecified intracranial injury with loss of consciousness status unknown, initial encounter: Secondary | ICD-10-CM

## 2013-06-19 DIAGNOSIS — J45909 Unspecified asthma, uncomplicated: Secondary | ICD-10-CM | POA: Insufficient documentation

## 2013-06-19 HISTORY — DX: Essential (primary) hypertension: I10

## 2013-06-19 HISTORY — DX: Unspecified asthma, uncomplicated: J45.909

## 2013-06-19 MED ORDER — MORPHINE SULFATE 4 MG/ML IJ SOLN
4.0000 mg | Freq: Once | INTRAMUSCULAR | Status: DC
  Filled 2013-06-19: qty 1

## 2013-06-19 MED ORDER — IBUPROFEN 800 MG PO TABS: 800.0000 mg | ORAL_TABLET | Freq: Three times a day (TID) | ORAL | Status: DC

## 2013-06-19 MED ORDER — ONDANSETRON HCL 4 MG PO TABS: 4.0000 mg | ORAL_TABLET | Freq: Four times a day (QID) | ORAL | Status: DC

## 2013-06-19 MED ORDER — MORPHINE SULFATE 4 MG/ML IJ SOLN
4.0000 mg | Freq: Once | INTRAMUSCULAR | Status: AC
  Administered 2013-06-19: 4 mg via INTRAMUSCULAR

## 2013-06-19 MED ORDER — HYDROCODONE-ACETAMINOPHEN 5-325 MG PO TABS: 1.0000 | ORAL_TABLET | ORAL | Status: DC | PRN

## 2013-06-19 NOTE — ED Notes (Addendum)
Pt states she fell this am while walking down stairs. Pt states she fell on paver blocks. Pt states side hurts from R hip to R shoulder up torso. Pt states she hit her head, denies LOC.  Pt states it hurts to take deep breath. Pt alert, ambulatory. Pt denies blood thinners. Pt sent from urgent care.

## 2013-06-19 NOTE — Discharge Instructions (Signed)
Call for a follow up appointment with a Family or Primary Care Provider.  Return to the Emergency Department if your symptoms worsen.   Take medication as prescribed.  Use your incentive spirometer 2-3 times a day to prevent pneumonia.  Cognitive rest is encouraged.  If you have a headache sit in a dark room without TV, computers, books, tablets, or smart phones.

## 2013-06-19 NOTE — ED Provider Notes (Signed)
CSN: [id number]     Arrival date & time 06/19/13  1319 History   First MD Initiated Contact with Patient 06/19/13 1348     Chief Complaint  Patient presents with  . Fall     (Consider location/radiation/quality/duration/timing/severity/associated sxs/prior Treatment) HPI Comments: [name] is a 58 year old female with a past medical history of hypertension and asthma presenting to the emergency department with a witnessed fall earlier today.  The patient reports missing a step and falling on her right side hitting her head she denies loss of consciousness she reports there were multiple witnesses on the scene.  She denies headache, lightheadedness, double vision, blurry vision.  She reports her right anterior rib discomfort, worsened by deep inspirations. Patient was seen at [facility] clinic, and advised to go to the ED for further evaluation.  Patient is a 58 y.o. female presenting with fall. The history is provided by the patient. No language interpreter was used.  Fall Associated symptoms include chest pain. Pertinent negatives include no abdominal pain, diaphoresis, fever, headaches, numbness or weakness.    Past Medical History  Diagnosis Date  . Hypertension   . Asthma    Past Surgical History  Procedure Laterality Date  . Shoulder surgery    . Knee surgery    . Foot surgery     History reviewed. No pertinent family history. History  Substance Use Topics  . Smoking status: Never Smoker   . Smokeless tobacco: Not on file  . Alcohol Use: No   OB History   Grav Para Term Preterm Abortions TAB SAB Ect Mult Living                 Review of Systems  Constitutional: Negative for fever and diaphoresis.  Eyes: Negative for photophobia and visual disturbance.  Respiratory: Negative for shortness of breath and wheezing.   Cardiovascular: Positive for chest pain.       Chest wall pain  Gastrointestinal: Negative for abdominal pain.  Skin: Negative for color change  and wound.  Neurological: Negative for dizziness, syncope, weakness, light-headedness, numbness and headaches.      Allergies  Mango flavor and Percocet  Home Medications   Prior to Admission medications   Medication Sig Start Date End Date Taking? Authorizing Provider  ADVAIR DISKUS 250-50 MCG/DOSE AEPB Inhale 1 puff into the lungs 2 (two) times daily.  06/02/13  Yes Historical Provider, MD  b complex vitamins tablet Take 1 tablet by mouth every morning.   Yes Historical Provider, MD  cetirizine (ZYRTEC) 10 MG tablet Take 10 mg by mouth daily as needed for allergies.   Yes Historical Provider, MD  cholecalciferol (VITAMIN D) 1000 UNITS tablet Take 2,000 Units by mouth every morning.   Yes Historical Provider, MD  ESTRING 2 MG vaginal ring Place 2 mg vaginally every 3 (three) months.  06/06/13  Yes Historical Provider, MD  hydrochlorothiazide (HYDRODIURIL) 25 MG tablet Take 25 mg by mouth every morning.  04/03/13  Yes Historical Provider, MD  sertraline (ZOLOFT) 100 MG tablet Take 150 mg by mouth every morning.   Yes Historical Provider, MD  SYNTHROID 75 MCG tablet Take 75 mcg by mouth at bedtime.  05/28/13  Yes Historical Provider, MD   BP 141/73  Pulse 59  Temp(Src) 99 F (37.2 C) (Oral)  Resp 20  SpO2 94%  LMP 08/08/2010 Physical Exam  Nursing note and vitals reviewed. Constitutional: She is oriented to person, place, and time. She appears well-developed and well-nourished. No distress.  HENT:  Head: Normocephalic and atraumatic. Head is without raccoon's eyes, without Battle's sign, without abrasion and without contusion. Hair is normal.  Right Ear: Tympanic membrane and external ear normal. No mastoid tenderness. No hemotympanum.  Left Ear: Tympanic membrane and external ear normal. No mastoid tenderness. No hemotympanum.  Mouth/Throat: Uvula is midline and oropharynx is clear and moist.  Neck tenderness to palpation of the skull. No obvious deformity, crepitus.  Eyes:  Conjunctivae and EOM are normal. Pupils are equal, round, and reactive to light. No scleral icterus.  Neck: Neck supple.  Cardiovascular: Normal rate, regular rhythm and normal heart sounds.   No murmur heard. Pulmonary/Chest: Effort normal and breath sounds normal. Not tachypneic. No respiratory distress. She has no decreased breath sounds. She has no wheezes. She has no rhonchi. She has no rales. She exhibits tenderness and bony tenderness. She exhibits no crepitus, no edema, no deformity and no swelling.    Patient is able to speak in complete sentences.   Abdominal: Soft. Bowel sounds are normal. There is no tenderness. There is no rebound and no guarding.  Musculoskeletal: Normal range of motion. She exhibits no edema.  Neurological: She is alert and oriented to person, place, and time. No cranial nerve deficit or sensory deficit. She exhibits normal muscle tone. GCS eye subscore is 4. GCS verbal subscore is 5. GCS motor subscore is 6.  Speech is clear and goal oriented, follows commands Cranial nerves III - XII grossly intact, no facial droop Normal strength in upper and lower extremities bilaterally, strong and equal grip strength Sensation normal to light touch Normal Gait without assistance, moves all 4 extremities without ataxia, coordination intact Normal finger to nose and rapid alternating movements No pronator drift  Skin: Skin is warm and dry. No rash noted.  Psychiatric: She has a normal mood and affect. Her behavior is normal.    ED Course  Procedures (including critical care time) Labs Review Labs Reviewed - No data to display  Imaging Review Dg Ribs Unilateral W/chest Right  06/19/2013   CLINICAL DATA:  Fall today with pain and anterior lower chest. Asthma. Hypertension. Nonsmoker.  EXAM: RIGHT RIBS AND CHEST - 3+ VIEW  COMPARISON:  06/24/2010 chest radiograph  FINDINGS: Frontal view of the chest and four views of right-sided ribs. Bilateral shoulder arthroplasties.  Midline trachea. Normal heart size and mediastinal contours. No pleural effusion or pneumothorax. Clear lungs.  Right-sided rib films demonstrate S-shaped thoracolumbar spine curvature. Subtle osseous irregularity at the antero lateral most aspect of the right fourth and fifth ribs, favored to be within normal variation. Otherwise, no displaced rib fracture identified.  IMPRESSION: No convincing evidence of displaced rib fracture and no evidence of acute cardiopulmonary disease. Subtle osseous irregularity of the anterior fourth and fifth ribs is favored to be within normal variation. Correlate with point tenderness.   Electronically Signed   By: Abigail Miyamoto M.D.   On: 06/19/2013 14:38   CLINICAL DATA: [place]. Hit head. No LOC.  EXAM:  CT HEAD WITHOUT CONTRAST  TECHNIQUE:  Contiguous axial images were obtained from the base of the skull  through the vertex without contrast.  COMPARISON: None  FINDINGS:  Borderline premature for age cerebral atrophy. No acute stroke or  hemorrhage. No mass lesion or hydrocephalus. No extra-axial fluid.  The calvarium is intact. There is no sinus or mastoid disease.  IMPRESSION:  Borderline atrophy. No acute intracranial findings.  Electronically Signed  By: Rolla Flatten M.D.  On: 06/19/2013 15:49  EKG Interpretation None      MDM   Final diagnoses:  Mild traumatic brain injury  Rib fracture  Fall   Pt with a fall at home, slight hesitation with answering questions.  Self reports cognition has not returned to baseline. Re-eval Pts friend is in the room, discussed need for CT for possible bleed. Pt's friend reports pt is still still not at base line.  They agree with the plan. Chest XR shows subtle osseous irregularity over 4th and 5th ribs. Pt is tender to palpation.  Incentive spirometer discussed with patient CT ordered. Ct without acute findings. Discussed lab results, imaging results, and treatment plan with the patient. Cognitive rest  encouraged. Return precautions given. Reports understanding and no other concerns at this time.  Patient is stable for discharge at this time.  Meds given in ED:  Medications  morphine 4 MG/ML injection 4 mg (4 mg Intramuscular Given 06/19/13 1540)    Discharge Medication List as of 06/19/2013  4:34 PM    START taking these medications   Details  HYDROcodone-acetaminophen (NORCO/VICODIN) 5-325 MG per tablet Take 1 tablet by mouth every 4 (four) hours as needed., Starting 06/19/2013, Until Discontinued, Print    ibuprofen (ADVIL,MOTRIN) 800 MG tablet Take 1 tablet (800 mg total) by mouth 3 (three) times daily. Take with food, Starting 06/19/2013, Until Discontinued, Print    ondansetron (ZOFRAN) 4 MG tablet Take 1 tablet (4 mg total) by mouth every 6 (six) hours., Starting 06/19/2013, Until Discontinued, Print          Lorrine Kin, PA-C 06/20/13 2140

## 2013-06-21 NOTE — ED Provider Notes (Signed)
Medical screening examination/treatment/procedure(s) were performed by non-physician practitioner and as supervising physician I was immediately available for consultation/collaboration.   EKG Interpretation None        Neta Ehlers, MD 06/21/13 1003

## 2013-09-06 ENCOUNTER — Other Ambulatory Visit: Payer: Self-pay | Admitting: Family Medicine

## 2013-09-06 ENCOUNTER — Other Ambulatory Visit (HOSPITAL_COMMUNITY)
Admission: RE | Admit: 2013-09-06 | Discharge: 2013-09-06 | Disposition: A | Discharge status: Home / Self Care | Payer: BC Managed Care – PPO | Source: Ambulatory Visit | Attending: Family Medicine | Admitting: Family Medicine

## 2013-09-06 DIAGNOSIS — Z124 Encounter for screening for malignant neoplasm of cervix: Secondary | ICD-10-CM | POA: Insufficient documentation

## 2013-09-08 LAB — CYTOLOGY - PAP

## 2013-09-22 ENCOUNTER — Other Ambulatory Visit (HOSPITAL_COMMUNITY): Payer: Self-pay | Admitting: Family Medicine

## 2013-09-22 DIAGNOSIS — Z1231 Encounter for screening mammogram for malignant neoplasm of breast: Secondary | ICD-10-CM

## 2013-10-11 ENCOUNTER — Ambulatory Visit (HOSPITAL_COMMUNITY)
Admission: RE | Admit: 2013-10-11 | Discharge: 2013-10-11 | Disposition: A | Discharge status: Home / Self Care | Payer: BC Managed Care – PPO | Source: Ambulatory Visit | Attending: Family Medicine | Admitting: Family Medicine

## 2013-10-11 DIAGNOSIS — Z1231 Encounter for screening mammogram for malignant neoplasm of breast: Secondary | ICD-10-CM

## 2016-10-16 ENCOUNTER — Other Ambulatory Visit (HOSPITAL_COMMUNITY)
Admission: RE | Admit: 2016-10-16 | Discharge: 2016-10-16 | Disposition: A | Discharge status: Home / Self Care | Payer: BC Managed Care – PPO | Source: Ambulatory Visit | Attending: Family Medicine | Admitting: Family Medicine

## 2016-10-16 ENCOUNTER — Other Ambulatory Visit: Payer: Self-pay | Admitting: Family Medicine

## 2016-10-16 DIAGNOSIS — Z1231 Encounter for screening mammogram for malignant neoplasm of breast: Secondary | ICD-10-CM

## 2016-10-16 DIAGNOSIS — Z01411 Encounter for gynecological examination (general) (routine) with abnormal findings: Secondary | ICD-10-CM | POA: Insufficient documentation

## 2016-10-17 LAB — CYTOLOGY - PAP: Diagnosis: NEGATIVE

## 2016-10-24 ENCOUNTER — Ambulatory Visit
Admission: RE | Admit: 2016-10-24 | Discharge: 2016-10-24 | Disposition: A | Discharge status: Home / Self Care | Payer: BC Managed Care – PPO | Source: Ambulatory Visit | Attending: Family Medicine | Admitting: Family Medicine

## 2016-10-24 DIAGNOSIS — Z1231 Encounter for screening mammogram for malignant neoplasm of breast: Secondary | ICD-10-CM

## 2017-10-14 ENCOUNTER — Other Ambulatory Visit: Payer: Self-pay | Admitting: Family Medicine

## 2017-10-14 DIAGNOSIS — M2040 Other hammer toe(s) (acquired), unspecified foot: Secondary | ICD-10-CM | POA: Insufficient documentation

## 2017-10-14 DIAGNOSIS — Z1231 Encounter for screening mammogram for malignant neoplasm of breast: Secondary | ICD-10-CM

## 2017-11-12 ENCOUNTER — Ambulatory Visit
Admission: RE | Admit: 2017-11-12 | Discharge: 2017-11-12 | Disposition: A | Discharge status: Home / Self Care | Payer: BC Managed Care – PPO | Source: Ambulatory Visit | Attending: Family Medicine | Admitting: Family Medicine

## 2017-11-12 DIAGNOSIS — Z1231 Encounter for screening mammogram for malignant neoplasm of breast: Secondary | ICD-10-CM

## 2019-08-27 ENCOUNTER — Other Ambulatory Visit: Payer: Self-pay | Admitting: Family Medicine

## 2019-08-27 DIAGNOSIS — Z1231 Encounter for screening mammogram for malignant neoplasm of breast: Secondary | ICD-10-CM

## 2019-08-31 ENCOUNTER — Other Ambulatory Visit: Payer: Self-pay

## 2019-08-31 ENCOUNTER — Ambulatory Visit
Admission: RE | Admit: 2019-08-31 | Discharge: 2019-08-31 | Disposition: A | Discharge status: Home / Self Care | Payer: BC Managed Care – PPO | Source: Ambulatory Visit | Attending: Family Medicine | Admitting: Family Medicine

## 2019-08-31 DIAGNOSIS — Z1231 Encounter for screening mammogram for malignant neoplasm of breast: Secondary | ICD-10-CM

## 2019-11-11 ENCOUNTER — Other Ambulatory Visit: Payer: Self-pay | Admitting: Family Medicine

## 2019-11-11 DIAGNOSIS — E2839 Other primary ovarian failure: Secondary | ICD-10-CM

## 2020-02-03 ENCOUNTER — Ambulatory Visit
Admission: RE | Admit: 2020-02-03 | Discharge: 2020-02-03 | Disposition: A | Discharge status: Home / Self Care | Payer: BC Managed Care – PPO | Source: Ambulatory Visit | Attending: Family Medicine | Admitting: Family Medicine

## 2020-02-03 ENCOUNTER — Other Ambulatory Visit: Payer: Self-pay

## 2020-02-03 DIAGNOSIS — E2839 Other primary ovarian failure: Secondary | ICD-10-CM

## 2020-11-13 NOTE — Patient Instructions (Addendum)
DUE TO COVID-19 ONLY ONE VISITOR IS ALLOWED TO COME WITH YOU AND STAY IN THE WAITING ROOM ONLY DURING PRE OP AND PROCEDURE.   **NO VISITORS ARE ALLOWED IN THE SHORT STAY AREA OR RECOVERY ROOM!!**  IF YOU WILL BE ADMITTED INTO THE HOSPITAL YOU ARE ALLOWED ONLY TWO SUPPORT PEOPLE DURING VISITATION HOURS ONLY (10AM -8PM)   The support person(s) may change daily. The support person(s) must pass our screening, gel in and out, and wear a mask at all times, including in the patient's room. Patients must also wear a mask when staff or their support person are in the room.  No visitors under the age of 58. Any visitor under the age of 46 must be accompanied by an adult.   COVID SWAB TESTING MUST BE COMPLETED ON:  Friday, 11-24-20 Between the hours of 8 and 3  **MUST PRESENT COMPLETED FORM AT TESTING SITE**    [place] (backside of the building)  You are not required to quarantine, however you are required to wear a well-fitted mask when you are out and around people not in your household.  Hand Hygiene often Do NOT share personal items Notify your provider if you are in close contact with someone who has COVID or you develop fever 100.4 or greater, new onset of sneezing, cough, sore throat, shortness of breath or body aches.         Your procedure is scheduled on:  Tuesday, 11-28-20   Report to [facility] Main  Entrance    Report to admitting at 7:15 AM   Call this number if you have problems the morning of surgery [phone]   Do not eat food :After Midnight.   May have liquids until 7:00 AM day of surgery  CLEAR LIQUID DIET  Foods Allowed                                                                     Foods Excluded  Water, Black Coffee (no milk/no creamer) and tea, regular and decaf                              liquids that you cannot  Plain Jell-O in any flavor  (No red)                         see through such as: Fruit ices (not with  fruit pulp)                                 milk, soups, orange juice  Iced Popsicles (No red)                                    All solid food                             Apple juices Sports drinks like Gatorade (No red) Lightly seasoned clear broth or consume(fat free) Sugar     Complete  one Ensure drink the morning of surgery at 7:00 AM the day of surgery.       The day of surgery:  Drink ONE (1) Pre-Surgery Clear Ensure or G2 by am the morning of surgery. Drink in one sitting. Do not sip.  This drink was given to you during your hospital  pre-op appointment visit. Nothing else to drink after completing the  Pre-Surgery Clear Ensure or G2.          If you have questions, please contact your surgeon's office.     Oral Hygiene is also important to reduce your risk of infection.                                    Remember - BRUSH YOUR TEETH THE MORNING OF SURGERY WITH YOUR REGULAR TOOTHPASTE   Do NOT smoke after Midnight   Take these medicines the morning of surgery with A SIP OF WATER:  Cetirizine, Sertraline.  Advair and Albuterol. Tramadol if needed   Stop all vitamins and herbal supplements a week before surgery              You may not have any metal on your body including hair pins, jewelry, and body piercing             Do not wear make-up, lotions, powders, perfumes or deodorant  Do not wear nail polish including gel and S&S, artificial/acrylic nails, or any other type of covering on natural nails including finger and toenails. If you have artificial nails, gel coating, etc. that needs to be removed by a nail salon please have this removed prior to surgery or surgery may need to be canceled/ delayed if the surgeon/ anesthesia feels like they are unable to be safely monitored.   Do not shave  48 hours prior to surgery.      Do not bring valuables to the hospital. [place].   Contacts, dentures or bridgework may not be worn into  surgery.     Patients discharged the day of surgery will not be allowed to drive home.  Special Instructions: Bring a copy of your healthcare power of attorney and living will documents the day of surgery if you haven't scanned them in before.  Please read over the following fact sheets you were given: IF YOU HAVE QUESTIONS ABOUT YOUR PRE OP INSTRUCTIONS PLEASE CALL [place] - Preparing for Surgery Before surgery, you can play an important role.  Because skin is not sterile, your skin needs to be as free of germs as possible.  You can reduce the number of germs on your skin by washing with CHG (chlorahexidine gluconate) soap before surgery.  CHG is an antiseptic cleaner which kills germs and bonds with the skin to continue killing germs even after washing. Please DO NOT use if you have an allergy to CHG or antibacterial soaps.  If your skin becomes reddened/irritated stop using the CHG and inform your nurse when you arrive at Short Stay. Do not shave (including legs and underarms) for at least 48 hours prior to the first CHG shower.  You may shave your face/neck.  Please follow these instructions carefully:  1.  Shower with CHG Soap the night before surgery and the  morning of surgery.  2.  If you choose to wash your hair, wash your hair first as usual with  your normal  shampoo.  3.  After you shampoo, rinse your hair and body thoroughly to remove the shampoo.                             4.  Use CHG as you would any other liquid soap.  You can apply chg directly to the skin and wash.  Gently with a scrungie or clean washcloth.  5.  Apply the CHG Soap to your body ONLY FROM THE NECK DOWN.   Do   not use on face/ open                           Wound or open sores. Avoid contact with eyes, ears mouth and   genitals (private parts).                       Wash face,  Genitals (private parts) with your normal soap.             6.  Wash thoroughly, paying special attention to the  area where your    surgery  will be performed.  7.  Thoroughly rinse your body with warm water from the neck down.  8.  DO NOT shower/wash with your normal soap after using and rinsing off the CHG Soap.                9.  Pat yourself dry with a clean towel.            10.  Wear clean pajamas.            11.  Place clean sheets on your bed the night of your first shower and do not  sleep with pets. Day of Surgery : Do not apply any lotions/deodorants the morning of surgery.  Please wear clean clothes to the hospital/surgery center.  FAILURE TO FOLLOW THESE INSTRUCTIONS MAY RESULT IN THE CANCELLATION OF YOUR SURGERY  PATIENT SIGNATURE_________________________________  NURSE SIGNATURE__________________________________  ________________________________________________________________________   [name]  An incentive spirometer is a tool that can help keep your lungs clear and active. This tool measures how well you are filling your lungs with each breath. Taking long deep breaths may help reverse or decrease the chance of developing breathing (pulmonary) problems (especially infection) following: A long period of time when you are unable to move or be active. BEFORE THE PROCEDURE  If the spirometer includes an indicator to show your best effort, your nurse or respiratory therapist will set it to a desired goal. If possible, sit up straight or lean slightly forward. Try not to slouch. Hold the incentive spirometer in an upright position. INSTRUCTIONS FOR USE  Sit on the edge of your bed if possible, or sit up as far as you can in bed or on a chair. Hold the incentive spirometer in an upright position. Breathe out normally. Place the mouthpiece in your mouth and seal your lips tightly around it. Breathe in slowly and as deeply as possible, raising the piston or the ball toward the top of the column. Hold your breath for 3-5 seconds or for as long as possible. Allow the piston or  ball to fall to the bottom of the column. Remove the mouthpiece from your mouth and breathe out normally. Rest for a few seconds and repeat Steps 1 through 7 at least 10 times every 1-2 hours when you are  awake. Take your time and take a few normal breaths between deep breaths. The spirometer may include an indicator to show your best effort. Use the indicator as a goal to work toward during each repetition. After each set of 10 deep breaths, practice coughing to be sure your lungs are clear. If you have an incision (the cut made at the time of surgery), support your incision when coughing by placing a pillow or rolled up towels firmly against it. Once you are able to get out of bed, walk around indoors and cough well. You may stop using the incentive spirometer when instructed by your caregiver.  RISKS AND COMPLICATIONS Take your time so you do not get dizzy or light-headed. If you are in pain, you may need to take or ask for pain medication before doing incentive spirometry. It is harder to take a deep breath if you are having pain. AFTER USE Rest and breathe slowly and easily. It can be helpful to keep track of a log of your progress. Your caregiver can provide you with a simple table to help with this. If you are using the spirometer at home, follow these instructions: [place] IF:  You are having difficultly using the spirometer. You have trouble using the spirometer as often as instructed. Your pain medication is not giving enough relief while using the spirometer. You develop fever of 100.5 F (38.1 C) or higher. SEEK IMMEDIATE MEDICAL CARE IF:  You cough up bloody sputum that had not been present before. You develop fever of 102 F (38.9 C) or greater. You develop worsening pain at or near the incision site. MAKE SURE YOU:  Understand these instructions. Will watch your condition. Will get help right away if you are not doing well or get worse. Document Released:  07/01/2006 Document Revised: 05/13/2011 Document Reviewed: 09/01/2006 ExitCare Patient Information 2014 ExitCare, [place].   ________________________________________________________________________  WHAT IS A BLOOD TRANSFUSION? Blood Transfusion Information  A transfusion is the replacement of blood or some of its parts. Blood is made up of multiple cells which provide different functions. Red blood cells carry oxygen and are used for blood loss replacement. White blood cells fight against infection. Platelets control bleeding. Plasma helps clot blood. Other blood products are available for specialized needs, such as hemophilia or other clotting disorders. BEFORE THE TRANSFUSION  Who gives blood for transfusions?  Healthy volunteers who are fully evaluated to make sure their blood is safe. This is blood bank blood. Transfusion therapy is the safest it has ever been in the practice of medicine. Before blood is taken from a donor, a complete history is taken to make sure that person has no history of diseases nor engages in risky social behavior (examples are intravenous drug use or sexual activity with multiple partners). The donor's travel history is screened to minimize risk of transmitting infections, such as malaria. The donated blood is tested for signs of infectious diseases, such as HIV and hepatitis. The blood is then tested to be sure it is compatible with you in order to minimize the chance of a transfusion reaction. If you or a relative donates blood, this is often done in anticipation of surgery and is not appropriate for emergency situations. It takes many days to process the donated blood. RISKS AND COMPLICATIONS Although transfusion therapy is very safe and saves many lives, the main dangers of transfusion include:  Getting an infectious disease. Developing a transfusion reaction. This is an allergic reaction to something in the  blood you were given. Every precaution is taken to  prevent this. The decision to have a blood transfusion has been considered carefully by your caregiver before blood is given. Blood is not given unless the benefits outweigh the risks. AFTER THE TRANSFUSION Right after receiving a blood transfusion, you will usually feel much better and more energetic. This is especially true if your red blood cells have gotten low (anemic). The transfusion raises the level of the red blood cells which carry oxygen, and this usually causes an energy increase. The nurse administering the transfusion will monitor you carefully for complications. HOME CARE INSTRUCTIONS  No special instructions are needed after a transfusion. You may find your energy is better. Speak with your caregiver about any limitations on activity for underlying diseases you may have. SEEK MEDICAL CARE IF:  Your condition is not improving after your transfusion. You develop redness or irritation at the intravenous (IV) site. SEEK IMMEDIATE MEDICAL CARE IF:  Any of the following symptoms occur over the next 12 hours: Shaking chills. You have a temperature by mouth above 102 F (38.9 C), not controlled by medicine. Chest, back, or muscle pain. People around you feel you are not acting correctly or are confused. Shortness of breath or difficulty breathing. Dizziness and fainting. You get a rash or develop hives. You have a decrease in urine output. Your urine turns a dark color or changes to pink, red, or brown. Any of the following symptoms occur over the next 10 days: You have a temperature by mouth above 102 F (38.9 C), not controlled by medicine. Shortness of breath. Weakness after normal activity. The white part of the eye turns yellow (jaundice). You have a decrease in the amount of urine or are urinating less often. Your urine turns a dark color or changes to pink, red, or brown. Document Released: 02/16/2000 Document Revised: 05/13/2011 Document Reviewed: 10/05/2007 [facility]  Patient Information 2014 [place], [place].  _______________________________________________________________________

## 2020-11-13 NOTE — Progress Notes (Addendum)
COVID swab appointment:  N/A  COVID Vaccine Completed:  Yes x3 Date COVID Vaccine completed: Has received booster: COVID vaccine manufacturer: [employer]   Date of COVID positive in last 90 days:  No  PCP - Sela Hilding, MD Cardiologist - N/A  Chest x-ray - N/A EKG - 04-19-20 at Dr. Lindell Noe.  On chart Stress Test - N/A ECHO - N/A Cardiac Cath - N/A Pacemaker/ICD device last checked: Spinal Cord Stimulator:  Sleep Study - N/A CPAP -   Fasting Blood Sugar - N/A Checks Blood Sugar _____ times a day  Blood Thinner Instructions:  N/A Aspirin Instructions: Last Dose:  Activity level:  Can go up a flight of stairs and perform activities of daily living without stopping and without symptoms of chest pain or shortness of breath.       Anesthesia review:  N/A  Patient denies shortness of breath, fever, cough and chest pain at PAT appointment   Patient verbalized understanding of instructions that were given to them at the PAT appointment. Patient was also instructed that they will need to review over the PAT instructions again at home before surgery.

## 2020-11-16 ENCOUNTER — Encounter (HOSPITAL_COMMUNITY)
Admission: RE | Admit: 2020-11-16 | Discharge: 2020-11-16 | Disposition: A | Discharge status: Home / Self Care | Payer: Medicare PPO | Source: Ambulatory Visit | Attending: Orthopedic Surgery | Admitting: Orthopedic Surgery

## 2020-11-16 ENCOUNTER — Encounter (HOSPITAL_COMMUNITY): Payer: Self-pay

## 2020-11-16 ENCOUNTER — Other Ambulatory Visit: Payer: Self-pay

## 2020-11-16 DIAGNOSIS — Z01818 Encounter for other preprocedural examination: Secondary | ICD-10-CM | POA: Diagnosis not present

## 2020-11-16 HISTORY — DX: Unspecified osteoarthritis, unspecified site: M19.90

## 2020-11-16 HISTORY — DX: Depression, unspecified: F32.A

## 2020-11-16 HISTORY — DX: Nontoxic goiter, unspecified: E04.9

## 2020-11-16 LAB — SURGICAL PCR SCREEN
MRSA, PCR: NEGATIVE
Staphylococcus aureus: NEGATIVE

## 2020-11-16 LAB — TYPE AND SCREEN
ABO/RH(D): O NEG
Antibody Screen: NEGATIVE

## 2020-11-16 NOTE — Progress Notes (Signed)
Left a message for [name] at Dr. Aurea Graff office regarding patient staying overnight after surgery.  Posting sheet states that patient is ambulatory but patient stated at PAT appointment that she would be an overnight stay.

## 2020-11-24 ENCOUNTER — Other Ambulatory Visit: Payer: Self-pay | Admitting: Orthopedic Surgery

## 2020-11-24 LAB — SARS CORONAVIRUS 2 (TAT 6-24 HRS): SARS Coronavirus 2: NEGATIVE

## 2020-11-27 ENCOUNTER — Encounter (HOSPITAL_COMMUNITY): Payer: Self-pay | Admitting: Orthopedic Surgery

## 2020-11-27 NOTE — H&P (Signed)
TOTAL KNEE ADMISSION H&P  Patient is being admitted for right total knee arthroplasty.  Subjective:  Chief Complaint:right knee pain.  HPI: [name], 65 y.o. female, has a history of pain and functional disability in the right knee due to arthritis and has failed non-surgical conservative treatments for greater than 12 weeks to includeNSAID's and/or analgesics, corticosteriod injections, and activity modification.  Onset of symptoms was gradual, starting 2 years ago with gradually worsening course since that time. The patient noted no past surgery on the right knee(s).  Patient currently rates pain in the right knee(s) at 7 out of 10 with activity. Patient has worsening of pain with activity and weight bearing, pain that interferes with activities of daily living, and pain with passive range of motion.  Patient has evidence of joint space narrowing by imaging studies. There is no active infection.  There are no problems to display for this patient.  Past Medical History:  Diagnosis Date   Arthritis    Asthma    Depression    Goiter    Hypertension     Past Surgical History:  Procedure Laterality Date   CHOLECYSTECTOMY     FOOT SURGERY     KNEE SURGERY     SHOULDER SURGERY     TOTAL HIP ARTHROPLASTY Left     No current facility-administered medications for this encounter.   Current Outpatient Medications  Medication Sig Dispense Refill Last Dose   ADVAIR DISKUS 250-50 MCG/DOSE AEPB Inhale 2 puffs into the lungs 2 (two) times daily.      albuterol (VENTOLIN HFA) 108 (90 Base) MCG/ACT inhaler Inhale 2 puffs into the lungs every 6 (six) hours as needed for wheezing or shortness of breath.      Ascorbic Acid (VITAMIN C) 1000 MG tablet Take 1,000 mg by mouth daily.      calcium carbonate (TUMS - DOSED IN MG ELEMENTAL CALCIUM) 500 MG chewable tablet Chew 1,000 mg by mouth daily as needed for indigestion or heartburn.      cetirizine (ZYRTEC) 10 MG tablet Take 10 mg by mouth 2  (two) times daily.      Cholecalciferol (VITAMIN D) 50 MCG (2000 UT) tablet Take 2,000 Units by mouth 2 (two) times daily.      diclofenac (VOLTAREN) 75 MG EC tablet Take 75 mg by mouth 2 (two) times daily.      ESTRING 2 MG vaginal ring Place 2 mg vaginally every 3 (three) months.       hydrochlorothiazide (HYDRODIURIL) 25 MG tablet Take 25 mg by mouth every morning.       Magnesium 400 MG TABS Take 400 mg by mouth daily.      sertraline (ZOLOFT) 100 MG tablet Take 150 mg by mouth every morning.      traMADol (ULTRAM) 50 MG tablet Take 50 mg by mouth at bedtime as needed for pain.      Allergies  Allergen Reactions   Mango Flavor [Flavoring Agent] Anaphylaxis    Specifically mango peel , patient can eat the fruit itself   Oxycontin [Oxycodone]     Dizziness    Social History   Tobacco Use   Smoking status: Former    Packs/day: 1.00    Years: 4.00    Pack years: 4.00    Types: Cigarettes    Quit date: 1974    Years since quitting: 48.7   Smokeless tobacco: Never  Substance Use Topics   Alcohol use: Yes    Comment: Wine  occasional    Family History  Problem Relation Age of Onset   Breast cancer Maternal Grandmother      Review of Systems  Constitutional:  Negative for chills and fever.  Respiratory:  Negative for cough and shortness of breath.   Cardiovascular:  Negative for chest pain.  Gastrointestinal:  Negative for nausea and vomiting.  Musculoskeletal:  Positive for arthralgias.    Objective:  Physical Exam Well nourished and well developed. General: Alert and oriented x3, cooperative and pleasant, no acute distress. Head: normocephalic, atraumatic, neck supple. Eyes: EOMI.  Musculoskeletal: Right knee exam: Significant valgus with slight flexion contracture. Her valgus is passively correctable with pain. Most of her pain is over the lateral and anterior aspect the knee She is neurovascular intact distally without lower extremity edema or erythema  Calves  soft and nontender. Motor function intact in LE. Strength 5/5 LE bilaterally. Neuro: Distal pulses 2+. Sensation to light touch intact in LE.  Vital signs in last 24 hours:    Labs:   Estimated body mass index is 34.71 kg/m as calculated from the following:   Height as of 11/16/20: 5\' 4"  (1.626 m).   Weight as of 11/16/20: 91.7 kg.   Imaging Review Plain radiographs demonstrate severe degenerative joint disease of the right knee(s). The overall alignment isneutral. The bone quality appears to be adequate for age and reported activity level.      Assessment/Plan:  End stage arthritis, right knee   The patient history, physical examination, clinical judgment of the provider and imaging studies are consistent with end stage degenerative joint disease of the right knee(s) and total knee arthroplasty is deemed medically necessary. The treatment options including medical management, injection therapy arthroscopy and arthroplasty were discussed at length. The risks and benefits of total knee arthroplasty were presented and reviewed. The risks due to aseptic loosening, infection, stiffness, patella tracking problems, thromboembolic complications and other imponderables were discussed. The patient acknowledged the explanation, agreed to proceed with the plan and consent was signed. Patient is being admitted for inpatient treatment for surgery, pain control, PT, OT, prophylactic antibiotics, VTE prophylaxis, progressive ambulation and ADL's and discharge planning. The patient is planning to be discharged  home.  Therapy Plans: outpatient therapy at [facility] Disposition: Home with husband Planned DVT Prophylaxis: aspirin 81mg  BID DME needed: none PCP: Dr. Sela Hilding, apt on 9/13 TXA: IV Allergies: Oxycontin - dizzy, disoriented Anesthesia Concerns: none BMI: 34.8 Last HgbA1c: Not diabetic  Other: - Dilaudid, tylenol, celebrex, robaxin   Patient's anticipated LOS is less than 2  midnights, meeting these requirements: - Younger than 45 - Lives within 1 hour of care - Has a competent adult at home to recover with post-op recover - NO history of  - Chronic pain requiring opiods  - Diabetes  - Coronary Artery Disease  - Heart failure  - Heart attack  - Stroke  - DVT/VTE  - Cardiac arrhythmia  - Respiratory Failure/COPD  - Renal failure  - Anemia  - Advanced Liver disease  Griffith Citron, PA-C Orthopedic Surgery [facility] [place] [phone]

## 2020-11-28 ENCOUNTER — Ambulatory Visit (HOSPITAL_COMMUNITY): Payer: Medicare PPO | Admitting: Physician Assistant

## 2020-11-28 ENCOUNTER — Observation Stay (HOSPITAL_COMMUNITY)
Admission: RE | Admit: 2020-11-28 | Discharge: 2020-11-29 | Disposition: A | Discharge status: Home / Self Care | Payer: Medicare PPO | Attending: Orthopedic Surgery | Admitting: Orthopedic Surgery

## 2020-11-28 ENCOUNTER — Encounter (HOSPITAL_COMMUNITY)
Admission: RE | Disposition: A | Discharge status: Home / Self Care | Payer: Self-pay | Source: Home / Self Care | Attending: Orthopedic Surgery

## 2020-11-28 ENCOUNTER — Other Ambulatory Visit: Payer: Self-pay

## 2020-11-28 ENCOUNTER — Ambulatory Visit (HOSPITAL_COMMUNITY): Payer: Medicare PPO | Admitting: Anesthesiology

## 2020-11-28 ENCOUNTER — Encounter (HOSPITAL_COMMUNITY): Payer: Self-pay | Admitting: Orthopedic Surgery

## 2020-11-28 DIAGNOSIS — Z79899 Other long term (current) drug therapy: Secondary | ICD-10-CM | POA: Insufficient documentation

## 2020-11-28 DIAGNOSIS — M1711 Unilateral primary osteoarthritis, right knee: Principal | ICD-10-CM | POA: Insufficient documentation

## 2020-11-28 DIAGNOSIS — Z87891 Personal history of nicotine dependence: Secondary | ICD-10-CM | POA: Insufficient documentation

## 2020-11-28 DIAGNOSIS — J45909 Unspecified asthma, uncomplicated: Secondary | ICD-10-CM | POA: Diagnosis not present

## 2020-11-28 DIAGNOSIS — Z96651 Presence of right artificial knee joint: Secondary | ICD-10-CM

## 2020-11-28 DIAGNOSIS — I10 Essential (primary) hypertension: Secondary | ICD-10-CM | POA: Diagnosis not present

## 2020-11-28 DIAGNOSIS — Z96642 Presence of left artificial hip joint: Secondary | ICD-10-CM | POA: Diagnosis not present

## 2020-11-28 HISTORY — PX: TOTAL KNEE ARTHROPLASTY: SHX125

## 2020-11-28 LAB — CBC
HCT: 35.6 % — ABNORMAL LOW (ref 36.0–46.0)
Hemoglobin: 10.8 g/dL — ABNORMAL LOW (ref 12.0–15.0)
MCH: 24.5 pg — ABNORMAL LOW (ref 26.0–34.0)
MCHC: 30.3 g/dL (ref 30.0–36.0)
MCV: 80.7 fL (ref 80.0–100.0)
Platelets: 223 10*3/uL (ref 150–400)
RBC: 4.41 MIL/uL (ref 3.87–5.11)
RDW: 16.7 % — ABNORMAL HIGH (ref 11.5–15.5)
WBC: 9.4 10*3/uL (ref 4.0–10.5)
nRBC: 0 % (ref 0.0–0.2)

## 2020-11-28 SURGERY — ARTHROPLASTY, KNEE, TOTAL
Anesthesia: General | Site: Knee | Laterality: Right

## 2020-11-28 MED ORDER — DEXMEDETOMIDINE (PRECEDEX) IN NS 20 MCG/5ML (4 MCG/ML) IV SYRINGE
PREFILLED_SYRINGE | INTRAVENOUS | Status: AC
  Filled 2020-11-28: qty 5

## 2020-11-28 MED ORDER — TRANEXAMIC ACID-NACL 1000-0.7 MG/100ML-% IV SOLN
1000.0000 mg | INTRAVENOUS | Status: AC
  Administered 2020-11-28: 1000 mg via INTRAVENOUS
  Filled 2020-11-28: qty 100

## 2020-11-28 MED ORDER — PROPOFOL 10 MG/ML IV BOLUS
INTRAVENOUS | Status: DC | PRN
  Administered 2020-11-28: 150 mg via INTRAVENOUS

## 2020-11-28 MED ORDER — FENTANYL CITRATE PF 50 MCG/ML IJ SOSY
25.0000 ug | PREFILLED_SYRINGE | INTRAMUSCULAR | Status: DC | PRN
  Administered 2020-11-28 (×2): 25 ug via INTRAVENOUS
  Administered 2020-11-28: 50 ug via INTRAVENOUS

## 2020-11-28 MED ORDER — ONDANSETRON HCL 4 MG/2ML IJ SOLN
INTRAMUSCULAR | Status: DC | PRN
  Administered 2020-11-28: 4 mg via INTRAVENOUS

## 2020-11-28 MED ORDER — POVIDONE-IODINE 10 % EX SWAB
2.0000 "application " | Freq: Once | CUTANEOUS | Status: AC
  Administered 2020-11-28: 2 via TOPICAL

## 2020-11-28 MED ORDER — BISACODYL 10 MG RE SUPP: 10.0000 mg | Freq: Every day | RECTAL | Status: DC | PRN

## 2020-11-28 MED ORDER — ONDANSETRON HCL 4 MG PO TABS: 4.0000 mg | ORAL_TABLET | Freq: Four times a day (QID) | ORAL | Status: DC | PRN

## 2020-11-28 MED ORDER — SUCCINYLCHOLINE CHLORIDE 200 MG/10ML IV SOSY
PREFILLED_SYRINGE | INTRAVENOUS | Status: AC
  Filled 2020-11-28: qty 10

## 2020-11-28 MED ORDER — MOMETASONE FURO-FORMOTEROL FUM 200-5 MCG/ACT IN AERO
2.0000 | INHALATION_SPRAY | Freq: Two times a day (BID) | RESPIRATORY_TRACT | Status: DC
  Filled 2020-11-28: qty 8.8

## 2020-11-28 MED ORDER — ROCURONIUM BROMIDE 10 MG/ML (PF) SYRINGE
PREFILLED_SYRINGE | INTRAVENOUS | Status: AC
  Filled 2020-11-28: qty 10

## 2020-11-28 MED ORDER — POLYETHYLENE GLYCOL 3350 17 G PO PACK: 17.0000 g | PACK | Freq: Every day | ORAL | Status: DC | PRN

## 2020-11-28 MED ORDER — SODIUM CHLORIDE (PF) 0.9 % IJ SOLN
INTRAMUSCULAR | Status: DC | PRN
  Administered 2020-11-28: 30 mL

## 2020-11-28 MED ORDER — SODIUM CHLORIDE 0.9 % IV SOLN: INTRAVENOUS | Status: DC

## 2020-11-28 MED ORDER — KETOROLAC TROMETHAMINE 30 MG/ML IJ SOLN
INTRAMUSCULAR | Status: DC | PRN
  Administered 2020-11-28: 30 mg

## 2020-11-28 MED ORDER — HYDROMORPHONE HCL 2 MG PO TABS
2.0000 mg | ORAL_TABLET | ORAL | Status: DC | PRN
  Administered 2020-11-28: 4 mg via ORAL
  Administered 2020-11-28: 2 mg via ORAL
  Administered 2020-11-28 – 2020-11-29 (×3): 4 mg via ORAL
  Filled 2020-11-28 (×5): qty 2

## 2020-11-28 MED ORDER — MENTHOL 3 MG MT LOZG: 1.0000 | LOZENGE | OROMUCOSAL | Status: DC | PRN

## 2020-11-28 MED ORDER — FENTANYL CITRATE PF 50 MCG/ML IJ SOSY
50.0000 ug | PREFILLED_SYRINGE | INTRAMUSCULAR | Status: DC
  Administered 2020-11-28: 100 ug via INTRAVENOUS
  Filled 2020-11-28: qty 2

## 2020-11-28 MED ORDER — CEFAZOLIN SODIUM-DEXTROSE 2-4 GM/100ML-% IV SOLN
2.0000 g | INTRAVENOUS | Status: AC
  Administered 2020-11-28: 2 g via INTRAVENOUS
  Filled 2020-11-28: qty 100

## 2020-11-28 MED ORDER — DEXAMETHASONE SODIUM PHOSPHATE 10 MG/ML IJ SOLN
10.0000 mg | Freq: Once | INTRAMUSCULAR | Status: AC
  Administered 2020-11-29: 10 mg via INTRAVENOUS
  Filled 2020-11-28: qty 1

## 2020-11-28 MED ORDER — ROCURONIUM BROMIDE 10 MG/ML (PF) SYRINGE
PREFILLED_SYRINGE | INTRAVENOUS | Status: DC | PRN
  Administered 2020-11-28: 60 mg via INTRAVENOUS

## 2020-11-28 MED ORDER — BUPIVACAINE-EPINEPHRINE (PF) 0.25% -1:200000 IJ SOLN
INTRAMUSCULAR | Status: DC | PRN
  Administered 2020-11-28: 30 mL

## 2020-11-28 MED ORDER — METOCLOPRAMIDE HCL 5 MG/ML IJ SOLN: 5.0000 mg | Freq: Three times a day (TID) | INTRAMUSCULAR | Status: DC | PRN

## 2020-11-28 MED ORDER — SUGAMMADEX SODIUM 200 MG/2ML IV SOLN
INTRAVENOUS | Status: DC | PRN
  Administered 2020-11-28: 200 mg via INTRAVENOUS

## 2020-11-28 MED ORDER — FENTANYL CITRATE PF 50 MCG/ML IJ SOSY
PREFILLED_SYRINGE | INTRAMUSCULAR | Status: AC
  Administered 2020-11-28: 50 ug via INTRAVENOUS
  Filled 2020-11-28: qty 3

## 2020-11-28 MED ORDER — DOCUSATE SODIUM 100 MG PO CAPS
100.0000 mg | ORAL_CAPSULE | Freq: Two times a day (BID) | ORAL | Status: DC
  Administered 2020-11-28 – 2020-11-29 (×2): 100 mg via ORAL
  Filled 2020-11-28 (×2): qty 1

## 2020-11-28 MED ORDER — ALBUTEROL SULFATE (2.5 MG/3ML) 0.083% IN NEBU: 3.0000 mL | INHALATION_SOLUTION | Freq: Four times a day (QID) | RESPIRATORY_TRACT | Status: DC | PRN

## 2020-11-28 MED ORDER — PROPOFOL 10 MG/ML IV BOLUS
INTRAVENOUS | Status: AC
  Filled 2020-11-28: qty 20

## 2020-11-28 MED ORDER — DEXAMETHASONE SODIUM PHOSPHATE 10 MG/ML IJ SOLN
INTRAMUSCULAR | Status: DC | PRN
  Administered 2020-11-28: 10 mg via INTRAVENOUS

## 2020-11-28 MED ORDER — METOCLOPRAMIDE HCL 5 MG PO TABS: 5.0000 mg | ORAL_TABLET | Freq: Three times a day (TID) | ORAL | Status: DC | PRN

## 2020-11-28 MED ORDER — ONDANSETRON HCL 4 MG/2ML IJ SOLN: 4.0000 mg | Freq: Four times a day (QID) | INTRAMUSCULAR | Status: DC | PRN

## 2020-11-28 MED ORDER — 0.9 % SODIUM CHLORIDE (POUR BTL) OPTIME
TOPICAL | Status: DC | PRN
  Administered 2020-11-28: 1000 mL

## 2020-11-28 MED ORDER — LIDOCAINE 2% (20 MG/ML) 5 ML SYRINGE
INTRAMUSCULAR | Status: DC | PRN
  Administered 2020-11-28: 100 mg via INTRAVENOUS

## 2020-11-28 MED ORDER — ASPIRIN 81 MG PO CHEW
81.0000 mg | CHEWABLE_TABLET | Freq: Two times a day (BID) | ORAL | Status: DC
  Administered 2020-11-28 – 2020-11-29 (×2): 81 mg via ORAL
  Filled 2020-11-28 (×2): qty 1

## 2020-11-28 MED ORDER — HYDROCHLOROTHIAZIDE 25 MG PO TABS
25.0000 mg | ORAL_TABLET | Freq: Every morning | ORAL | Status: DC
  Administered 2020-11-29: 25 mg via ORAL
  Filled 2020-11-28: qty 1

## 2020-11-28 MED ORDER — CHLORHEXIDINE GLUCONATE 0.12 % MT SOLN
15.0000 mL | Freq: Once | OROMUCOSAL | Status: AC
Start: 2020-11-28 — End: 2020-11-28
  Administered 2020-11-28: 15 mL via OROMUCOSAL

## 2020-11-28 MED ORDER — FERROUS SULFATE 325 (65 FE) MG PO TABS
325.0000 mg | ORAL_TABLET | Freq: Three times a day (TID) | ORAL | Status: DC
  Administered 2020-11-28 – 2020-11-29 (×2): 325 mg via ORAL
  Filled 2020-11-28 (×2): qty 1

## 2020-11-28 MED ORDER — HYDROMORPHONE HCL 1 MG/ML IJ SOLN
INTRAMUSCULAR | Status: AC
  Filled 2020-11-28: qty 1

## 2020-11-28 MED ORDER — METHOCARBAMOL 500 MG IVPB - SIMPLE MED
500.0000 mg | Freq: Four times a day (QID) | INTRAVENOUS | Status: DC | PRN
  Administered 2020-11-28: 500 mg via INTRAVENOUS
  Filled 2020-11-28: qty 50

## 2020-11-28 MED ORDER — PHENOL 1.4 % MT LIQD: 1.0000 | OROMUCOSAL | Status: DC | PRN

## 2020-11-28 MED ORDER — DEXMEDETOMIDINE (PRECEDEX) IN NS 20 MCG/5ML (4 MCG/ML) IV SYRINGE
PREFILLED_SYRINGE | INTRAVENOUS | Status: DC | PRN
  Administered 2020-11-28 (×3): 4 ug via INTRAVENOUS
  Administered 2020-11-28: 8 ug via INTRAVENOUS

## 2020-11-28 MED ORDER — CELECOXIB 200 MG PO CAPS
200.0000 mg | ORAL_CAPSULE | Freq: Two times a day (BID) | ORAL | Status: DC
  Administered 2020-11-28 – 2020-11-29 (×2): 200 mg via ORAL
  Filled 2020-11-28 (×2): qty 1

## 2020-11-28 MED ORDER — HYDROMORPHONE HCL 1 MG/ML IJ SOLN
0.5000 mg | Freq: Once | INTRAMUSCULAR | Status: AC
  Administered 2020-11-28: 0.5 mg via INTRAVENOUS

## 2020-11-28 MED ORDER — CEFAZOLIN SODIUM-DEXTROSE 2-4 GM/100ML-% IV SOLN
2.0000 g | Freq: Four times a day (QID) | INTRAVENOUS | Status: AC
Start: 2020-11-28 — End: 2020-11-28
  Administered 2020-11-28 (×2): 2 g via INTRAVENOUS
  Filled 2020-11-28 (×2): qty 100

## 2020-11-28 MED ORDER — METHOCARBAMOL 500 MG IVPB - SIMPLE MED
INTRAVENOUS | Status: AC
  Filled 2020-11-28: qty 50

## 2020-11-28 MED ORDER — LORATADINE 10 MG PO TABS
10.0000 mg | ORAL_TABLET | Freq: Every day | ORAL | Status: DC
  Filled 2020-11-28: qty 1

## 2020-11-28 MED ORDER — HYDROMORPHONE HCL 1 MG/ML IJ SOLN
INTRAMUSCULAR | Status: DC | PRN
  Administered 2020-11-28 (×2): 1 mg via INTRAVENOUS

## 2020-11-28 MED ORDER — LACTATED RINGERS IV SOLN: INTRAVENOUS | Status: DC

## 2020-11-28 MED ORDER — METHOCARBAMOL 500 MG PO TABS
500.0000 mg | ORAL_TABLET | Freq: Four times a day (QID) | ORAL | Status: DC | PRN
  Administered 2020-11-28 – 2020-11-29 (×3): 500 mg via ORAL
  Filled 2020-11-28 (×3): qty 1

## 2020-11-28 MED ORDER — HYDROMORPHONE HCL 1 MG/ML IJ SOLN
0.5000 mg | INTRAMUSCULAR | Status: DC | PRN
Start: 2020-11-28 — End: 2020-11-29
  Administered 2020-11-28: 1 mg via INTRAVENOUS
  Filled 2020-11-28: qty 1

## 2020-11-28 MED ORDER — HYDROMORPHONE HCL 2 MG/ML IJ SOLN
INTRAMUSCULAR | Status: AC
  Filled 2020-11-28: qty 1

## 2020-11-28 MED ORDER — TRANEXAMIC ACID-NACL 1000-0.7 MG/100ML-% IV SOLN
1000.0000 mg | Freq: Once | INTRAVENOUS | Status: AC
  Administered 2020-11-28: 1000 mg via INTRAVENOUS
  Filled 2020-11-28: qty 100

## 2020-11-28 MED ORDER — LIDOCAINE HCL (PF) 2 % IJ SOLN
INTRAMUSCULAR | Status: AC
  Filled 2020-11-28: qty 5

## 2020-11-28 MED ORDER — STERILE WATER FOR IRRIGATION IR SOLN
Status: DC | PRN
  Administered 2020-11-28: 2000 mL

## 2020-11-28 MED ORDER — DIPHENHYDRAMINE HCL 12.5 MG/5ML PO ELIX: 12.5000 mg | ORAL_SOLUTION | ORAL | Status: DC | PRN

## 2020-11-28 MED ORDER — SERTRALINE HCL 50 MG PO TABS
150.0000 mg | ORAL_TABLET | Freq: Every morning | ORAL | Status: DC
  Administered 2020-11-29: 150 mg via ORAL
  Filled 2020-11-28: qty 1

## 2020-11-28 MED ORDER — DEXAMETHASONE SODIUM PHOSPHATE 10 MG/ML IJ SOLN: 8.0000 mg | Freq: Once | INTRAMUSCULAR | Status: DC

## 2020-11-28 MED ORDER — MIDAZOLAM HCL 2 MG/2ML IJ SOLN
1.0000 mg | INTRAMUSCULAR | Status: DC
  Administered 2020-11-28: 2 mg via INTRAVENOUS
  Filled 2020-11-28: qty 2

## 2020-11-28 MED ORDER — LACTATED RINGERS IV SOLN
INTRAVENOUS | Status: DC
Start: 2020-11-28 — End: 2020-11-28

## 2020-11-28 MED ORDER — SODIUM CHLORIDE 0.9 % IR SOLN
Status: DC | PRN
  Administered 2020-11-28: 1000 mL

## 2020-11-28 MED ORDER — ACETAMINOPHEN 325 MG PO TABS: 325.0000 mg | ORAL_TABLET | Freq: Four times a day (QID) | ORAL | Status: DC | PRN

## 2020-11-28 MED ORDER — ORAL CARE MOUTH RINSE: 15.0000 mL | Freq: Once | OROMUCOSAL | Status: AC

## 2020-11-28 SURGICAL SUPPLY — 53 items
ADH SKN CLS APL DERMABOND .7 (GAUZE/BANDAGES/DRESSINGS) ×1
ATTUNE MED ANAT PAT 35 KNEE (Knees) ×1 IMPLANT
ATTUNE PSFEM RTSZ5 NARCEM KNEE (Femur) ×2 IMPLANT
ATTUNE PSRP INSE SZ5 7 KNEE (Insert) ×2 IMPLANT
BAG COUNTER SPONGE SURGICOUNT (BAG) ×1 IMPLANT
BAG SPEC THK2 15X12 ZIP CLS (MISCELLANEOUS) ×1
BAG SPNG CNTER NS LX DISP (BAG) ×1
BAG ZIPLOCK 12X15 (MISCELLANEOUS) ×1 IMPLANT
BASE TIBIAL ROT PLAT SZ 5 KNEE (Knees) ×1 IMPLANT
BLADE SAW SGTL 11.0X1.19X90.0M (BLADE) ×1 IMPLANT
BLADE SAW SGTL 13.0X1.19X90.0M (BLADE) ×2 IMPLANT
BLADE SURG SZ10 CARB STEEL (BLADE) ×4 IMPLANT
BNDG ELASTIC 6X5.8 VLCR STR LF (GAUZE/BANDAGES/DRESSINGS) ×2 IMPLANT
BOWL SMART MIX CTS (DISPOSABLE) ×2 IMPLANT
BSPLAT TIB 5 CMNT ROT PLAT STR (Knees) ×1 IMPLANT
CEMENT HV SMART SET (Cement) ×4 IMPLANT
CUFF TOURN SGL QUICK 34 (TOURNIQUET CUFF) ×2
CUFF TRNQT CYL 34X4.125X (TOURNIQUET CUFF) ×1 IMPLANT
DECANTER SPIKE VIAL GLASS SM (MISCELLANEOUS) ×4 IMPLANT
DERMABOND ADVANCED (GAUZE/BANDAGES/DRESSINGS) ×1
DERMABOND ADVANCED .7 DNX12 (GAUZE/BANDAGES/DRESSINGS) ×1 IMPLANT
DRAPE INCISE IOBAN 66X45 STRL (DRAPES) ×6 IMPLANT
DRAPE U-SHAPE 47X51 STRL (DRAPES) ×2 IMPLANT
DRESSING AQUACEL AG SP 3.5X10 (GAUZE/BANDAGES/DRESSINGS) ×1 IMPLANT
DRSG AQUACEL AG SP 3.5X10 (GAUZE/BANDAGES/DRESSINGS) ×2
DURAPREP 26ML APPLICATOR (WOUND CARE) ×4 IMPLANT
ELECT REM PT RETURN 15FT ADLT (MISCELLANEOUS) ×2 IMPLANT
GLOVE SURG ENC MOIS LTX SZ6 (GLOVE) ×2 IMPLANT
GLOVE SURG ENC MOIS LTX SZ7 (GLOVE) ×2 IMPLANT
GLOVE SURG POLY ORTHO LF SZ6.5 (GLOVE) ×2 IMPLANT
GLOVE SURG UNDER POLY LF SZ7.5 (GLOVE) ×2 IMPLANT
GOWN STRL REUS W/TWL LRG LVL3 (GOWN DISPOSABLE) ×2 IMPLANT
HANDPIECE INTERPULSE COAX TIP (DISPOSABLE) ×2
KIT TURNOVER KIT A (KITS) ×2 IMPLANT
MANIFOLD NEPTUNE II (INSTRUMENTS) ×2 IMPLANT
NDL SAFETY ECLIPSE 18X1.5 (NEEDLE) IMPLANT
NEEDLE HYPO 18GX1.5 SHARP (NEEDLE) ×2
NS IRRIG 1000ML POUR BTL (IV SOLUTION) ×2 IMPLANT
PACK TOTAL KNEE CUSTOM (KITS) ×2 IMPLANT
PROTECTOR NERVE ULNAR (MISCELLANEOUS) ×2 IMPLANT
SET HNDPC FAN SPRY TIP SCT (DISPOSABLE) ×1 IMPLANT
SET PAD KNEE POSITIONER (MISCELLANEOUS) ×2 IMPLANT
SPONGE T-LAP 18X18 ~~LOC~~+RFID (SPONGE) ×3 IMPLANT
SUT MNCRL AB 4-0 PS2 18 (SUTURE) ×2 IMPLANT
SUT STRATAFIX PDS+ 0 24IN (SUTURE) ×2 IMPLANT
SUT VIC AB 1 CT1 36 (SUTURE) ×2 IMPLANT
SUT VIC AB 2-0 CT1 27 (SUTURE) ×6
SUT VIC AB 2-0 CT1 TAPERPNT 27 (SUTURE) ×3 IMPLANT
SYR 3ML LL SCALE MARK (SYRINGE) ×2 IMPLANT
TIBIAL BASE ROT PLAT SZ 5 KNEE (Knees) ×2 IMPLANT
TUBE SUCTION HIGH CAP CLEAR NV (SUCTIONS) ×2 IMPLANT
WATER STERILE IRR 1000ML POUR (IV SOLUTION) ×4 IMPLANT
WRAP KNEE MAXI GEL POST OP (GAUZE/BANDAGES/DRESSINGS) ×2 IMPLANT

## 2020-11-28 NOTE — Anesthesia Postprocedure Evaluation (Signed)
Anesthesia Post Note  Patient: [name]  Procedure(s) Performed: TOTAL KNEE ARTHROPLASTY (Right: Knee)     Patient location during evaluation: PACU Anesthesia Type: General Level of consciousness: awake Pain management: pain level controlled Vital Signs Assessment: post-procedure vital signs reviewed and stable Respiratory status: spontaneous breathing Cardiovascular status: stable Postop Assessment: no apparent nausea or vomiting Anesthetic complications: no   No notable events documented.  Last Vitals:  Vitals:   11/28/20 1513 11/28/20 1800  BP: (!) 109/50 (!) 129/56  Pulse: 79 71  Resp: 16 18  Temp: 36.7 C 36.8 C  SpO2: 94% 95%    Last Pain:  Vitals:   11/28/20 1902  TempSrc:   PainSc: 4                  Khia Dieterich

## 2020-11-28 NOTE — Progress Notes (Signed)
AssistedDr. Charlene Green with right, ultrasound guided, adductor canal block. Side rails up, monitors on throughout procedure. See vital signs in flow sheet. Tolerated Procedure well. ? ?

## 2020-11-28 NOTE — Discharge Instructions (Signed)

## 2020-11-28 NOTE — Anesthesia Procedure Notes (Signed)
Procedure Name: Intubation Date/Time: 11/28/2020 10:14 AM Performed by: Lavina Hamman, CRNA Pre-anesthesia Checklist: Patient identified, Emergency Drugs available, Suction available, Patient being monitored and Timeout performed Patient Re-evaluated:Patient Re-evaluated prior to induction Oxygen Delivery Method: Circle system utilized Preoxygenation: Pre-oxygenation with 100% oxygen Induction Type: IV induction Ventilation: Mask ventilation without difficulty Laryngoscope Size: Mac and 3 Grade View: Grade I Tube type: Oral Tube size: 7.0 mm Number of attempts: 1 Airway Equipment and Method: Stylet Placement Confirmation: ETT inserted through vocal cords under direct vision, positive ETCO2, CO2 detector and breath sounds checked- equal and bilateral Secured at: 22 cm Tube secured with: Tape Dental Injury: Teeth and Oropharynx as per pre-operative assessment  Comments: ATOI

## 2020-11-28 NOTE — Evaluation (Addendum)
Physical Therapy Evaluation Patient Details Name: [name] MRN: [id number] DOB: June 26, 1955 Today's Date: 11/28/2020  History of Present Illness  Patient is 65 y.o. female s/p Rt TKA on 11/28/20 with PMH significant for OA, asthma, depression, HTN, Lt THA.  Clinical Impression  [name] is a 65 y.o. female POD 0 s/p Rt TKA. Patient reports independence with mobility at baseline. Patient is now limited by functional impairments (see PT problem list below) and requires min assist for transfers and gait with RW. Patient was able to ambulate ~50 feet with RW and min guard/assist. Patient instructed in exercise to facilitate circulation to manage edema and reduce risk of DVT. Patient will benefit from continued skilled PT interventions to address impairments and progress towards PLOF. Acute PT will follow to progress mobility and stair training in preparation for safe discharge home.        Recommendations for follow up therapy are one component of a multi-disciplinary discharge planning process, led by the attending physician.  Recommendations may be updated based on patient status, additional functional criteria and insurance authorization.  Follow Up Recommendations Follow surgeon's recommendation for DC plan and follow-up therapies;Outpatient PT    Equipment Recommendations  Rolling walker with 5" wheels;3in1 (PT)    Recommendations for Other Services       Precautions / Restrictions Precautions Precautions: Fall Restrictions Weight Bearing Restrictions: No RLE Weight Bearing: Weight bearing as tolerated      Mobility  Bed Mobility Overal bed mobility: Needs Assistance Bed Mobility: Supine to Sit     Supine to sit: HOB elevated;Min guard     General bed mobility comments: pt taking extra time and cues for use of bed rail to pivot, guarding for safety    Transfers Overall transfer level: Needs assistance Equipment used: Rolling walker (2 wheeled) Transfers: Sit  to/from Stand Sit to Stand: Min assist         General transfer comment: cues for hand placement to rise from EOB, assist to power up.  Ambulation/Gait Ambulation/Gait assistance: Min guard;Min assist Gait Distance (Feet): 50 Feet Assistive device: Rolling walker (2 wheeled) Gait Pattern/deviations: Step-to pattern;Decreased stride length;Shuffle;Trunk flexed Gait velocity: decr   General Gait Details: cues for step to pattern with RW, and assist at start to manage walker position. pt progressed to min guard for safety.  Stairs            Wheelchair Mobility    Modified Rankin (Stroke Patients Only)       Balance Overall balance assessment: Needs assistance Sitting-balance support: Feet supported Sitting balance-Leahy Scale: Good     Standing balance support: During functional activity;Bilateral upper extremity supported Standing balance-Leahy Scale: Fair                               Pertinent Vitals/Pain Pain Assessment: 0-10 Pain Score: 2  Pain Location: Rt knee Pain Descriptors / Indicators: Aching;Discomfort Pain Intervention(s): Monitored during session;Limited activity within patient's tolerance;Premedicated before session;Repositioned;Ice applied    Home Living Family/patient expects to be discharged to:: Private residence Living Arrangements: Spouse/significant other Available Help at Discharge: Family Type of Home: House Home Access: Stairs to enter Entrance Stairs-Rails: Can reach both Entrance Stairs-Number of Steps: 4 Home Layout: Multi-level;1/2 bath on main level;Able to live on main level with bedroom/bathroom;Bed/bath upstairs Home Equipment: Walker - 2 wheels;Cane - single point;Bedside commode Additional Comments: can stay on main, but normally bedroom on second level  Prior Function Level of Independence: Independent               Hand Dominance   Dominant Hand: Left    Extremity/Trunk Assessment   Upper  Extremity Assessment Upper Extremity Assessment: Overall WFL for tasks assessed    Lower Extremity Assessment Lower Extremity Assessment: RLE deficits/detail RLE Deficits / Details: good quad activation, no extensor lag with SLR RLE Sensation: WNL RLE Coordination: WNL    Cervical / Trunk Assessment Cervical / Trunk Assessment: Normal  Communication   Communication: No difficulties  Cognition Arousal/Alertness: Awake/alert Behavior During Therapy: WFL for tasks assessed/performed Overall Cognitive Status: Within Functional Limits for tasks assessed                                        General Comments      Exercises Total Joint Exercises Ankle Circles/Pumps: AROM;Both;Seated;15 reps   Assessment/Plan    PT Assessment Patient needs continued PT services  PT Problem List Decreased strength;Decreased range of motion;Decreased activity tolerance;Decreased balance;Decreased mobility;Decreased knowledge of use of DME;Decreased knowledge of precautions;Pain       PT Treatment Interventions DME instruction;Gait training;Stair training;Functional mobility training;Therapeutic activities;Therapeutic exercise;Balance training;Patient/family education    PT Goals (Current goals can be found in the Care Plan section)  Acute Rehab PT Goals PT Goal Formulation: With patient Time For Goal Achievement: 12/05/20 Potential to Achieve Goals: Good    Frequency 7X/week   Barriers to discharge        Co-evaluation               AM-PAC PT "6 Clicks" Mobility  Outcome Measure Help needed turning from your back to your side while in a flat bed without using bedrails?: A Little Help needed moving from lying on your back to sitting on the side of a flat bed without using bedrails?: A Little Help needed moving to and from a bed to a chair (including a wheelchair)?: A Little Help needed standing up from a chair using your arms (e.g., wheelchair or bedside chair)?: A  Little Help needed to walk in hospital room?: A Little Help needed climbing 3-5 steps with a railing? : A Little 6 Click Score: 18    End of Session Equipment Utilized During Treatment: Gait belt Activity Tolerance: Patient tolerated treatment well Patient left: in chair;with chair alarm set;with call bell/phone within reach Nurse Communication: Mobility status PT Visit Diagnosis: Muscle weakness (generalized) (M62.81);Difficulty in walking, not elsewhere classified (R26.2)    Time: 1962-2297 PT Time Calculation (min) (ACUTE ONLY): 19 min   Charges:   PT Evaluation $PT Eval Low Complexity: 1 Low          Verner Mould, DPT Acute Rehabilitation Services Office [phone] Pager [phone]   Jacques Navy 11/28/2020, 4:20 PM

## 2020-11-28 NOTE — Anesthesia Preprocedure Evaluation (Addendum)
Anesthesia Evaluation  Patient identified by MRN, date of birth, ID band Patient awake    Reviewed: Allergy & Precautions, NPO status , Patient's Chart, lab work & pertinent test results  Airway Mallampati: II  TM Distance: >3 FB     Dental   Pulmonary asthma , former smoker,    breath sounds clear to auscultation       Cardiovascular hypertension,  Rhythm:Regular Rate:Normal     Neuro/Psych    GI/Hepatic negative GI ROS, Neg liver ROS,   Endo/Other  negative endocrine ROS  Renal/GU negative Renal ROS     Musculoskeletal   Abdominal   Peds  Hematology   Anesthesia Other Findings   Reproductive/Obstetrics                             Anesthesia Physical Anesthesia Plan  ASA: 3  Anesthesia Plan: General   Post-op Pain Management:  Regional for Post-op pain   Induction: Intravenous  PONV Risk Score and Plan: 3 and Ondansetron, Dexamethasone and Midazolam  Airway Management Planned: Oral ETT  Additional Equipment:   Intra-op Plan:   Post-operative Plan: Extubation in OR  Informed Consent: I have reviewed the patients History and Physical, chart, labs and discussed the procedure including the risks, benefits and alternatives for the proposed anesthesia with the patient or authorized representative who has indicated his/her understanding and acceptance.     Dental advisory given  Plan Discussed with: CRNA and Anesthesiologist  Anesthesia Plan Comments:         Anesthesia Quick Evaluation

## 2020-11-28 NOTE — Transfer of Care (Signed)
Immediate Anesthesia Transfer of Care Note  Patient: [name]  Procedure(s) Performed: Procedure(s): TOTAL KNEE ARTHROPLASTY (Right)  Patient Location: PACU  Anesthesia Type:General  Level of Consciousness:  sedated, patient cooperative and responds to stimulation  Airway & Oxygen Therapy:Patient Spontanous Breathing and Patient connected to face mask oxgen  Post-op Assessment:  Report given to PACU RN and Post -op Vital signs reviewed and stable  Post vital signs:  Reviewed and stable  Last Vitals:  Vitals:   11/28/20 0917 11/28/20 0918  BP:  122/60  Pulse: 72 70  Resp: 10 11  Temp:    SpO2: 118% 86%    Complications: No apparent anesthesia complications

## 2020-11-28 NOTE — Interval H&P Note (Signed)
History and Physical Interval Note:  11/28/2020 8:40 AM  [name]  has presented today for surgery, with the diagnosis of Right knee osteoarthritis.  The various methods of treatment have been discussed with the patient and family. After consideration of risks, benefits and other options for treatment, the patient has consented to  Procedure(s): TOTAL KNEE ARTHROPLASTY (Right) as a surgical intervention.  The patient's history has been reviewed, patient examined, no change in status, stable for surgery.  I have reviewed the patient's chart and labs.  Questions were answered to the patient's satisfaction.     Mauri Pole

## 2020-11-28 NOTE — Op Note (Signed)
NAME:  [name]                      MEDICAL RECORD NO.:  [id number]                             FACILITY:  [facility]      PHYSICIAN:  Pietro Cassis. Alvan Dame, M.D.  DATE OF BIRTH:  Oct 04, 1955      DATE OF PROCEDURE:  11/28/2020                                     OPERATIVE REPORT         PREOPERATIVE DIAGNOSIS:  Right knee osteoarthritis.      POSTOPERATIVE DIAGNOSIS:  Right knee osteoarthritis.      FINDINGS:  The patient was noted to have complete loss of cartilage and   bone-on-bone arthritis with associated osteophytes in the lateral and patellofemoral compartments of   the knee with a significant synovitis and associated effusion.  The patient had failed months of conservative treatment including medications, injection therapy, activity modification.     PROCEDURE:  Right total knee replacement.      COMPONENTS USED:  DePuy Attune rotating platform posterior stabilized knee   system, a size 5N femur, 5 tibia, size 7 mm PS AOX insert, and 35 anatomic patellar   button.      SURGEON:  Pietro Cassis. Alvan Dame, M.D.      ASSISTANT:  Costella Hatcher, PA-C.      ANESTHESIA:  General and Regional.      SPECIMENS:  None.      COMPLICATION:  None.      DRAINS:  None.  EBL: <150      TOURNIQUET TIME:  37 min at 225 mmHg     The patient was stable to the recovery room.      INDICATION FOR PROCEDURE:  [name] is a 65 y.o. female patient of   mine.  The patient had been seen, evaluated, and treated for months conservatively in the   office with medication, activity modification, and injections.  The patient had   radiographic changes of bone-on-bone arthritis with endplate sclerosis and osteophytes noted.  Based on the radiographic changes and failed conservative measures, the patient   decided to proceed with definitive treatment, total knee replacement.  Risks of infection, DVT, component failure, need for revision surgery, neurovascular injury were reviewed in the office setting.   The postop course was reviewed stressing the efforts to maximize post-operative satisfaction and function.  Consent was obtained for benefit of pain   relief.      PROCEDURE IN DETAIL:  The patient was brought to the operative theater.   Once adequate anesthesia, preoperative antibiotics, 2 gm of Ancef,1 gm of Tranexamic Acid, and 10 mg of Decadron administered, the patient was positioned supine with a right thigh tourniquet placed.  The  right lower extremity was prepped and draped in sterile fashion.  A time-   out was performed identifying the patient, planned procedure, and the appropriate extremity.      The right lower extremity was placed in the [facility] leg holder.  The leg was   exsanguinated, tourniquet elevated to 250 mmHg.  A midline incision was   made followed by median parapatellar arthrotomy.  Following initial   exposure, attention was first  directed to the patella.  Precut   measurement was noted to be 17 mm.  I resected down to 13 mm and used a   35 anatomic patellar button to restore patellar height as well as cover the cut surface.      The lug holes were drilled and a metal shim was placed to protect the   patella from retractors and saw blade during the procedure.      At this point, attention was now directed to the femur.  The femoral   canal was opened with a drill, irrigated to try to prevent fat emboli.  An   intramedullary rod was passed at 5 degrees valgus, 9 mm of bone was   resected off the distal femur.  Following this resection, the tibia was   subluxated anteriorly.  Using the extramedullary guide, 2 mm of bone was resected off   the proximal medial tibia.  We confirmed the gap would be   stable medially and laterally with a size 5 spacer block as well as confirmed that the tibial cut was perpendicular in the coronal plane, checking with an alignment rod.      Once this was done, I sized the femur to be a size 5 in the anterior-   posterior dimension, chose  a narrow component based on medial and   lateral dimension.  The size 5 rotation block was then pinned in   position anterior referenced using the C-clamp to set rotation.  The   anterior, posterior, and  chamfer cuts were made without difficulty nor   notching making certain that I was along the anterior cortex to help   with flexion gap stability.      The final box cut was made off the lateral aspect of distal femur.      At this point, the tibia was sized to be a size 5.  The size 5 tray was   then pinned in position through the medial third of the tubercle,   drilled, and keel punched.  Trial reduction was now carried with a 5 femur,  5 tibia, a size 7 mm PS insert, and the 35 anatomic patella botton.  The knee was brought to full extension with good flexion stability with the patella   tracking through the trochlea without application of pressure.  Given   all these findings the trial components removed.  Final components were   opened and cement was mixed.  The knee was irrigated with normal saline solution and pulse lavage.  The synovial lining was   then injected with 30 cc of 0.25% Marcaine with epinephrine, 1 cc of Toradol and 30 cc of NS for a total of 61 cc.     Final implants were then cemented onto cleaned and dried cut surfaces of bone with the knee brought to extension with a size 7 mm PS trial insert.      Once the cement had fully cured, excess cement was removed   throughout the knee.  I confirmed that I was satisfied with the range of   motion and stability, and the final size 7 mm PS AOX insert was chosen.  It was   placed into the knee.      The tourniquet had been let down at 37 minutes.  No significant   hemostasis was required.  The extensor mechanism was then reapproximated using #1 Vicryl and #1 Stratafix sutures with the knee   in flexion.  The  remaining wound was closed with 2-0 Vicryl and running 4-0 Monocryl.   The knee was cleaned, dried, dressed  sterilely using Dermabond and   Aquacel dressing.  The patient was then   brought to recovery room in stable condition, tolerating the procedure   well.   Please note that Physician Assistant, Costella Hatcher, PA-C was present for the entirety of the case, and was utilized for pre-operative positioning, peri-operative retractor management, general facilitation of the procedure and for primary wound closure at the end of the case.              Pietro Cassis Alvan Dame, M.D.    11/28/2020 9:53 AM

## 2020-11-28 NOTE — Anesthesia Procedure Notes (Addendum)
Anesthesia Regional Block: Adductor canal block   Pre-Anesthetic Checklist: , timeout performed,  Correct Patient, Correct Site, Correct Laterality,  Correct Procedure, Correct Position, site marked,  Risks and benefits discussed,  Surgical consent,  Pre-op evaluation,  At surgeon's request  Laterality: Right  Prep: chloraprep       Needles:  Injection technique: Single-shot  Needle Type: Echogenic Stimulator Needle          Additional Needles:   Procedures: Doppler guided,,,, ultrasound used (permanent image in chart),,    Narrative:  Start time: 11/28/2020 9:05 AM End time: 11/28/2020 9:20 AM Injection made incrementally with aspirations every 5 mL.  Performed by: Personally  Anesthesiologist: Belinda Block, MD

## 2020-11-29 ENCOUNTER — Encounter (HOSPITAL_COMMUNITY): Payer: Self-pay | Admitting: Orthopedic Surgery

## 2020-11-29 DIAGNOSIS — M1711 Unilateral primary osteoarthritis, right knee: Secondary | ICD-10-CM | POA: Diagnosis not present

## 2020-11-29 LAB — CBC
HCT: 27.3 % — ABNORMAL LOW (ref 36.0–46.0)
Hemoglobin: 8.3 g/dL — ABNORMAL LOW (ref 12.0–15.0)
MCH: 24.6 pg — ABNORMAL LOW (ref 26.0–34.0)
MCHC: 30.4 g/dL (ref 30.0–36.0)
MCV: 81 fL (ref 80.0–100.0)
Platelets: 193 10*3/uL (ref 150–400)
RBC: 3.37 MIL/uL — ABNORMAL LOW (ref 3.87–5.11)
RDW: 16.8 % — ABNORMAL HIGH (ref 11.5–15.5)
WBC: 11.5 10*3/uL — ABNORMAL HIGH (ref 4.0–10.5)
nRBC: 0 % (ref 0.0–0.2)

## 2020-11-29 LAB — BASIC METABOLIC PANEL
Anion gap: 9 (ref 5–15)
BUN: 22 mg/dL (ref 8–23)
CO2: 26 mmol/L (ref 22–32)
Calcium: 8.6 mg/dL — ABNORMAL LOW (ref 8.9–10.3)
Chloride: 104 mmol/L (ref 98–111)
Creatinine, Ser: 0.7 mg/dL (ref 0.44–1.00)
GFR, Estimated: >60 mL/min (ref >60)
Glucose, Bld: 122 mg/dL — ABNORMAL HIGH (ref 70–99)
Potassium: 4.6 mmol/L (ref 3.5–5.1)
Sodium: 139 mmol/L (ref 135–145)

## 2020-11-29 MED ORDER — POLYETHYLENE GLYCOL 3350 17 G PO PACK: 17.0000 g | PACK | Freq: Every day | ORAL | 0 refills | Status: DC | PRN

## 2020-11-29 MED ORDER — CELECOXIB 200 MG PO CAPS: 200.0000 mg | ORAL_CAPSULE | Freq: Two times a day (BID) | ORAL | 0 refills | Status: DC

## 2020-11-29 MED ORDER — ASPIRIN 81 MG PO CHEW: 81.0000 mg | CHEWABLE_TABLET | Freq: Two times a day (BID) | ORAL | 0 refills | Status: AC

## 2020-11-29 MED ORDER — METHOCARBAMOL 500 MG PO TABS: 500.0000 mg | ORAL_TABLET | Freq: Four times a day (QID) | ORAL | 0 refills | Status: DC | PRN

## 2020-11-29 MED ORDER — DOCUSATE SODIUM 100 MG PO CAPS: 100.0000 mg | ORAL_CAPSULE | Freq: Two times a day (BID) | ORAL | 0 refills | Status: DC

## 2020-11-29 MED ORDER — HYDROMORPHONE HCL 2 MG PO TABS: 2.0000 mg | ORAL_TABLET | ORAL | 0 refills | Status: DC | PRN

## 2020-11-29 NOTE — Plan of Care (Signed)
  Problem: Education: Goal: Knowledge of General Education information will improve Description: Including pain rating scale, medication(s)/side effects and non-pharmacologic comfort measures 11/29/2020 1137 by Anda Kraft, RN Outcome: Progressing 11/29/2020 0822 by Anda Kraft, RN Outcome: Progressing   Problem: Health Behavior/Discharge Planning: Goal: Ability to manage health-related needs will improve 11/29/2020 1137 by Anda Kraft, RN Outcome: Progressing 11/29/2020 0822 by Anda Kraft, RN Outcome: Progressing   Problem: Activity: Goal: Risk for activity intolerance will decrease 11/29/2020 1137 by Anda Kraft, RN Outcome: Progressing 11/29/2020 0822 by Anda Kraft, RN Outcome: Progressing

## 2020-11-29 NOTE — Plan of Care (Signed)
  Problem: Education: Goal: Knowledge of General Education information will improve Description: Including pain rating scale, medication(s)/side effects and non-pharmacologic comfort measures Outcome: Progressing   Problem: Health Behavior/Discharge Planning: Goal: Ability to manage health-related needs will improve Outcome: Progressing   Problem: Activity: Goal: Risk for activity intolerance will decrease Outcome: Progressing   

## 2020-11-29 NOTE — Progress Notes (Signed)
The patient is alert and oriented and has been seen by her physician. The orders for discharge were written. IV has been removed. Went over discharge instructions with patient and family. She is being discharged via wheelchair with all of her belongings.  

## 2020-11-29 NOTE — TOC Transition Note (Signed)
Transition of Care [facility]) - CM/SW Discharge Note  Patient Details  Name: [name] MRN: [id number] Date of Birth: 03/01/1956  Transition of Care [facility]) CM/SW Contact:  Sherie Don, LCSW Phone Number: 11/29/2020, 11:20 AM  Clinical Narrative: Patient is expected to discharge home after working with PT. CSW met with patient to review discharge plan. Patient will discharge home with OPPT at [facility]. Patient has a rolling walker, elevated toilets, and 3N1 at home so there are no DME needs at this time. TOC signing off.  Final next level of care: OP Rehab Barriers to Discharge: No Barriers Identified  Patient Goals and CMS Choice Patient states their goals for this hospitalization and ongoing recovery are:: Discharge home with OPPT at [email or web address] Compare Post Acute Care list provided to:: Patient Choice offered to / list presented to : NA  Discharge Plan and Services         DME Arranged: N/A DME Agency: NA  Readmission Risk Interventions No flowsheet data found.

## 2020-11-29 NOTE — Progress Notes (Signed)
   Subjective: 1 Day Post-Op Procedure(s) (LRB): TOTAL KNEE ARTHROPLASTY (Right) Patient reports pain as mild.   Patient seen in rounds with Dr. Alvan Dame. Patient is well, and has had no acute complaints or problems. No acute events overnight. Voiding without difficulty. Patient ambulated 50 feet with PT.  We will continue therapy today.   Objective: Vital signs in last 24 hours: Temp:  [98 F (36.7 C)-99.5 F (37.5 C)] 98.5 F (36.9 C) (09/28 0443) Pulse Rate:  [66-86] 66 (09/28 0443) Resp:  [7-26] 16 (09/28 0443) BP: (109-158)/(50-71) 137/53 (09/28 0443) SpO2:  [93 %-100 %] 100 % (09/28 0443) Weight:  [91.7 kg] 91.7 kg (09/27 0754)  Intake/Output from previous day:  Intake/Output Summary (Last 24 hours) at 11/29/2020 0739 Last data filed at 11/29/2020 0602 Gross per 24 hour  Intake 2674.37 ml  Output 75 ml  Net 2599.37 ml     Intake/Output this shift: No intake/output data recorded.  Labs: Recent Labs    11/28/20 0749 11/29/20 0340  HGB 10.8* 8.3*   Recent Labs    11/28/20 0749 11/29/20 0340  WBC 9.4 11.5*  RBC 4.41 3.37*  HCT 35.6* 27.3*  PLT 223 193   Recent Labs    11/29/20 0340  NA 139  K 4.6  CL 104  CO2 26  BUN 22  CREATININE 0.70  GLUCOSE 122*  CALCIUM 8.6*   No results for input(s): LABPT, INR in the last 72 hours.  Exam: General - Patient is Alert and Oriented Extremity - Neurologically intact Sensation intact distally Intact pulses distally Dorsiflexion/Plantar flexion intact Dressing - dressing C/D/I Motor Function - intact, moving foot and toes well on exam.   Past Medical History:  Diagnosis Date   Arthritis    Asthma    Depression    Goiter    Hypertension     Assessment/Plan: 1 Day Post-Op Procedure(s) (LRB): TOTAL KNEE ARTHROPLASTY (Right) Active Problems:   S/P total knee arthroplasty, right  Estimated body mass index is 34.71 kg/m as calculated from the following:   Height as of this encounter: 5\' 4"  (1.626 m).    Weight as of this encounter: 91.7 kg. Advance diet Up with therapy D/C IV fluids   Patient's anticipated LOS is less than 2 midnights, meeting these requirements: - Younger than 24 - Lives within 1 hour of care - Has a competent adult at home to recover with post-op recover - NO history of  - Chronic pain requiring opiods  - Diabetes  - Coronary Artery Disease  - Heart failure  - Heart attack  - Stroke  - DVT/VTE  - Cardiac arrhythmia  - Respiratory Failure/COPD  - Renal failure  - Anemia  - Advanced Liver disease     DVT Prophylaxis - Aspirin Weight bearing as tolerated.  Plan is to go Home after hospital stay. Plan for discharge today following 1-2 sessions of PT as long as they are meeting their goals. Patient is scheduled for OPPT. Follow up in the office in 2 weeks.   Griffith Citron, PA-C Orthopedic Surgery [phone] 11/29/2020, 7:39 AM

## 2020-11-29 NOTE — Progress Notes (Signed)
Physical Therapy Treatment Patient Details Name: [name] MRN: [id number] DOB: [birth year] Today's Date: 11/29/2020   History of Present Illness Patient is 65 y.o. female s/p Rt TKA on 11/28/20 with PMH significant for OA, asthma, depression, HTN, Lt THA.    PT Comments    POD # 1  Assisted OOB to amb to bathroom.  General bed mobility comments: demonstrated on how to use a belt to self assist LE.  General transfer comment: cues for hand placement to rise from EOB, assist to power up.General Gait Details: cues for step to pattern with RW, and assist at start to manage walker position. pt progressed to min guard for safety. Practice stairs.  General stair comments: 25% VC's on proper hand sequencing and safety.  Then returned to room to perform some TE's following HEP handout.  Instructed on proper tech, freq as well as use of ICE.   Addressed all mobility questions, discussed appropriate activity, educated on use of ICE.  Pt ready for D/C to home.   Recommendations for follow up therapy are one component of a multi-disciplinary discharge planning process, led by the attending physician.  Recommendations may be updated based on patient status, additional functional criteria and insurance authorization.  Follow Up Recommendations  Follow surgeon's recommendation for DC plan and follow-up therapies;Outpatient PT     Equipment Recommendations  Rolling walker with 5" wheels;3in1 (PT)    Recommendations for Other Services       Precautions / Restrictions Precautions Precautions: Fall Precaution Comments: instructed no pillow under knee Restrictions Weight Bearing Restrictions: No RLE Weight Bearing: Weight bearing as tolerated     Mobility  Bed Mobility Overal bed mobility: Needs Assistance Bed Mobility: Supine to Sit           General bed mobility comments: demonstrated on how to use a belt to self assist LE    Transfers Overall transfer level: Needs  assistance Equipment used: Rolling walker (2 wheeled) Transfers: Sit to/from [facility] Sit to Stand: Supervision Stand pivot transfers: Supervision;Min guard       General transfer comment: cues for hand placement to rise from EOB, assist to power up.  Ambulation/Gait Ambulation/Gait assistance: Supervision Gait Distance (Feet): 45 Feet Assistive device: Rolling walker (2 wheeled) Gait Pattern/deviations: Step-to pattern;Decreased stride length;Shuffle;Trunk flexed Gait velocity: decr   General Gait Details: cues for step to pattern with RW, and assist at start to manage walker position. pt progressed to min guard for safety.   Stairs Stairs: Yes Stairs assistance: Supervision;Min guard Stair Management: Two rails;Forwards;Step to pattern Number of Stairs: 2 General stair comments: 25% VC's on proper hand sequencing and safety   Wheelchair Mobility    Modified Rankin (Stroke Patients Only)       Balance                                            Cognition Arousal/Alertness: Awake/alert Behavior During Therapy: WFL for tasks assessed/performed                                   General Comments: AxO x 3 very pleasant      Exercises  Total Knee Replacement TE's following HEP handout 10 reps B LE ankle pumps 05 reps towel squeezes 05 reps knee presses 05 reps heel slides  05 reps SAQ's 05 reps SLR's 05 reps ABD Educated on use of gait belt to assist with TE's Followed by ICE     General Comments        Pertinent Vitals/Pain Pain Assessment: 0-10 Pain Score: 5  Pain Location: Rt knee Pain Descriptors / Indicators: Aching;Discomfort;Operative site guarding;Tender Pain Intervention(s): Monitored during session;Premedicated before session;Repositioned;Ice applied    Home Living                      Prior Function            PT Goals (current goals can now be found in the care plan  section) Progress towards PT goals: Progressing toward goals    Frequency    7X/week      PT Plan Current plan remains appropriate    Co-evaluation              AM-PAC PT "6 Clicks" Mobility   Outcome Measure  Help needed turning from your back to your side while in a flat bed without using bedrails?: A Little Help needed moving from lying on your back to sitting on the side of a flat bed without using bedrails?: A Little Help needed moving to and from a bed to a chair (including a wheelchair)?: A Little Help needed standing up from a chair using your arms (e.g., wheelchair or bedside chair)?: A Little Help needed to walk in hospital room?: A Little   6 Click Score: 15    End of Session Equipment Utilized During Treatment: Gait belt Activity Tolerance: Patient tolerated treatment well Patient left: in chair;with chair alarm set;with call bell/phone within reach Nurse Communication: Mobility status (pt ready for D/C to home) PT Visit Diagnosis: Muscle weakness (generalized) (M62.81);Difficulty in walking, not elsewhere classified (R26.2)     Time: 1324-4010 PT Time Calculation (min) (ACUTE ONLY): 41 min  Charges:  $Gait Training: 8-22 mins $Therapeutic Exercise: 8-22 mins $Therapeutic Activity: 8-22 mins                     {Nazarene Bunning  PTA Acute  Rehabilitation Services Pager      [phone] Office      [phone]

## 2020-12-01 ENCOUNTER — Encounter (HOSPITAL_BASED_OUTPATIENT_CLINIC_OR_DEPARTMENT_OTHER): Payer: Self-pay | Admitting: Physical Therapy

## 2020-12-01 ENCOUNTER — Other Ambulatory Visit: Payer: Self-pay

## 2020-12-01 ENCOUNTER — Ambulatory Visit (HOSPITAL_BASED_OUTPATIENT_CLINIC_OR_DEPARTMENT_OTHER): Payer: Medicare PPO | Attending: Student | Admitting: Physical Therapy

## 2020-12-01 DIAGNOSIS — M25561 Pain in right knee: Secondary | ICD-10-CM | POA: Diagnosis not present

## 2020-12-01 DIAGNOSIS — R262 Difficulty in walking, not elsewhere classified: Secondary | ICD-10-CM | POA: Insufficient documentation

## 2020-12-01 DIAGNOSIS — R600 Localized edema: Secondary | ICD-10-CM | POA: Diagnosis present

## 2020-12-01 DIAGNOSIS — M25661 Stiffness of right knee, not elsewhere classified: Secondary | ICD-10-CM | POA: Diagnosis present

## 2020-12-01 NOTE — Therapy (Signed)
OUTPATIENT PHYSICAL THERAPY LOWER EXTREMITY EVALUATION   Patient Name: [name] MRN: [id number] DOB:30-Sep-1955, 65 y.o., female Today's Date: 12/01/2020   PT End of Session - 12/01/20 0934     Visit Number 1    Number of Visits 21    Date for PT Re-Evaluation 02/09/21    Authorization Type [employer] MCR Adv    Progress Note Due on Visit 10    PT Start Time 0930    PT Stop Time 1015    PT Time Calculation (min) 45 min    Activity Tolerance Patient tolerated treatment well    Behavior During Therapy WFL for tasks assessed/performed             Past Medical History:  Diagnosis Date   Arthritis    Asthma    Depression    Goiter    Hypertension    Past Surgical History:  Procedure Laterality Date   CHOLECYSTECTOMY     FOOT SURGERY     KNEE SURGERY Left    TKA x2    1974, 2014   SHOULDER SURGERY     TOTAL HIP ARTHROPLASTY Left    TOTAL KNEE ARTHROPLASTY Right 11/28/2020   Procedure: TOTAL KNEE ARTHROPLASTY;  Surgeon: Paralee Cancel, MD;  Location: [facility];  Service: Orthopedics;  Laterality: Right;   Patient Active Problem List   Diagnosis Date Noted   S/P total knee arthroplasty, 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  REFERRING PROVIDER: Irving Copas, PA-C, Dr Alvan Dame- surgeon  REFERRING DIAG: S/p Rt TKA  THERAPY DIAG:  Acute pain of right knee  Stiffness of right knee, not elsewhere classified  Difficulty in walking, not elsewhere classified  Localized edema  ONSET DATE: 11/28/20  SUBJECTIVE:   SUBJECTIVE STATEMENT: I had my Lt knee replaced the first time when I was 60. I have a 3-story home. It is a constant pain, I know the first week is the hardest. No AD use prior to surgery.   PERTINENT HISTORY: Lt TKA x2, Lt THA, Rt TKA  PAIN:  Are you having pain? Yes VAS scale: 7/10 Pain location: Rt knee Pain orientation: Right  PAIN TYPE: aching and it's just there Pain description: constant  Aggravating factors:  constant  Relieving factors: ice  PRECAUTIONS: None  WEIGHT BEARING RESTRICTIONS No  FALLS:  Has patient fallen in last 6 months? Yes, Number of falls: 1  LIVING ENVIRONMENT: Lives with: lives with their spouse Lives in: House/apartment Stairs: Yes; 3 story home, hand rail on 1 side, no bed/bath on main floor Has following equipment at home: Environmental consultant - 2 wheeled  OCCUPATION: retired  PLOF: Independent  PATIENT GOALS decrease pain, improve mobility, I want a normal gait   OBJECTIVE:     PATIENT SURVEYS:  LEFS 10  COGNITION:  Overall cognitive status: Within functional limits for tasks assessed     SENSATION:  WFL    POSTURE:  Weight shifted to Lt d/t Rt knee pain, slightly flexed due to walker use  LE AROM/PROM:  A/PROM Right 12/01/2020 Left 12/01/2020  Hip flexion    Hip extension    Hip abduction    Hip adduction    Hip internal rotation    Hip external rotation    Knee flexion 82   Knee extension -12   Ankle dorsiflexion    Ankle plantarflexion    Ankle inversion    Ankle eversion     (Blank rows = not tested)  LE MMT:  MMT Right 12/01/2020 Left 12/01/2020  Hip flexion 3+/5   Hip extension    Hip abduction    Hip adduction    Hip internal rotation    Hip external rotation    Knee flexion    Knee extension    Ankle dorsiflexion    Ankle plantarflexion    Ankle inversion    Ankle eversion     (Blank rows = not tested)  JOINT MOBILITY ASSESSMENT:  Stiffness as expected post op  Edema- noted incr on Rt v Lt  FUNCTIONAL TESTS:  To be evaluated as function improves  GAIT: Distance walked: in to clinic from check in- about 50 feet Assistive device utilized: Environmental consultant - 2 wheeled Level of assistance: Modified independence Comments: slow cadence, lacking knee flexion in swing through    TODAY'S TREATMENT: Passive knee flexion/heel slides Quad sets Vaso 15 min, low, 38 deg   PATIENT EDUCATION:  Education details: Geophysicist/field seismologist of  condition, POC, HEP, exercise form/rationale Person educated: Patient Education method: Explanation, Demonstration, Tactile cues, Verbal cues, and Handouts Education comprehension: verbalized understanding, returned demonstration, verbal cues required, tactile cues required, and needs further education   HOME EXERCISE PROGRAM: [email or web address]  ASSESSMENT:  CLINICAL IMPRESSION: Patient is a 65 y.o. F who was seen today for physical therapy evaluation and treatment for Rt TKA 3 days ago. Objective impairments include Abnormal gait, decreased activity tolerance, decreased mobility, difficulty walking, decreased ROM, decreased strength, increased edema, impaired flexibility, improper body mechanics, and pain. These impairments are limiting patient from cleaning, community activity, and driving. Personal factors including 3+ comorbidities: h/o multiple joint replacements  are also affecting patient's functional outcome. Patient will benefit from skilled PT to address above impairments and improve overall function.  REHAB POTENTIAL: Good  CLINICAL DECISION MAKING: Stable/uncomplicated  EVALUATION COMPLEXITY: Low   GOALS: Goals reviewed with patient? Yes  SHORT TERM GOALS:  STG Name Target Date Goal status  1 Knee ext to 0 deg Baseline: -20 at eval 10/21   INITIAL                                 LONG TERM GOALS:   LTG Name Target Date Goal status  1 ROM 0-120 Baseline: -20-80 02/09/2021 INITIAL  2 Demo heel- toe gait pattern without AD Baseline: RW at eval 02/09/2021 INITIAL  3 Able to navigate stairs step over step with unilateral hand rail Baseline: unable at eval 02/09/2021 INITIAL  4 Able to complete ADLs pain <=2/10 Baseline:severe and limiting at eval 02/09/2021 INITIAL                  PLAN: PT FREQUENCY: 2x/week  PT DURATION: 10 weeks  PLANNED INTERVENTIONS: Therapeutic exercises, Therapeutic activity, Neuro Muscular re-education, Balance training, Gait training,  Patient/Family education, Joint mobilization, Stair training, Aquatic Therapy, Cryotherapy, Taping, and Manual therapy  PLAN FOR NEXT SESSION: cont ROM, gait training as tol  Referring diagnosis?M17.11 (ICD-10-CM) - Unilateral primary osteoarthritis, right knee  Treatment diagnosis? (if different than referring diagnosis) s/p Rt TKA What was this (referring dx) caused by? [x]  Surgery []  Fall []  Ongoing issue []  Arthritis []  Other: ____________  Laterality: [x]  Rt []  Lt []  Both  Check all possible CPT codes:      []  97110 (Therapeutic Exercise)  []  92507 (SLP Treatment)  []  54098 (Neuro Re-ed)   []  92526 (Swallowing Treatment)   []  11914 (Gait Training)   []  [id number] (  Cognitive Training, 1st 15 minutes) []  97140 (Manual Therapy)   []  97130 (Cognitive Training, each add'l 15 minutes)  []  97530 (Therapeutic Activities)  []  Other, List CPT Code ____________    []  66815 (Self Care)       [x]  All codes above (97110 - 97535)  []  97012 (Mechanical Traction)  []  97014 (E-stim Unattended)  []  97032 (E-stim manual)  []  97033 (Ionto)  []  97035 (Ultrasound)  []  97760 (Orthotic Fit) []  [id number] (Physical Performance Training) [x]  [id number] (Aquatic Therapy) []  [id number] (Contrast Bath) []  [id number] (Paraffin) []  97597 (Wound Care 1st 20 sq cm) []  97598 (Wound Care each add'l 20 sq cm) []  97016 (Vasopneumatic Device) []  [phone] Comptroller) []  [phone] (Prosthetic Training)  Gay Filler. Aarilyn Dye PT, DPT 12/01/20 10:40 AM

## 2020-12-03 NOTE — Discharge Summary (Signed)
Physician Discharge Summary   Patient ID: [name] MRN: [id number] DOB/AGE: 1955/08/03 65 y.o.  Admit date: 11/28/2020 Discharge date: 11/29/2020  Primary Diagnosis: Right knee osteoarthritis.   Admission Diagnoses:  Past Medical History:  Diagnosis Date   Arthritis    Asthma    Depression    Goiter    Hypertension    Discharge Diagnoses:   Active Problems:   S/P total knee arthroplasty, right  Estimated body mass index is 34.71 kg/m as calculated from the following:   Height as of this encounter: 5\' 4"  (1.626 m).   Weight as of this encounter: 91.7 kg.  Procedure:  Procedure(s) (LRB): TOTAL KNEE ARTHROPLASTY (Right)   Consults: None  HPI:  [name] is a 65 y.o. female patient of   mine.  The patient had been seen, evaluated, and treated for months conservatively in the   office with medication, activity modification, and injections.  The patient had   radiographic changes of bone-on-bone arthritis with endplate sclerosis and osteophytes noted.  Based on the radiographic changes and failed conservative measures, the patient   decided to proceed with definitive treatment, total knee replacement.  Risks of infection, DVT, component failure, need for revision surgery, neurovascular injury were reviewed in the office setting.  The postop course was reviewed stressing the efforts to maximize post-operative satisfaction and function.  Consent was obtained for benefit of pain   relief.   Laboratory Data: Admission on 11/28/2020, Discharged on 11/29/2020  Component Date Value Ref Range Status   WBC 11/28/2020 9.4  4.0 - 10.5 K/uL Final   RBC 11/28/2020 4.41  3.87 - 5.11 MIL/uL Final   Hemoglobin 11/28/2020 10.8 (A) 12.0 - 15.0 g/dL Final   HCT 11/28/2020 35.6 (A) 36.0 - 46.0 % Final   MCV 11/28/2020 80.7  80.0 - 100.0 fL Final   MCH 11/28/2020 24.5 (A) 26.0 - 34.0 pg Final   MCHC 11/28/2020 30.3  30.0 - 36.0 g/dL Final   RDW 11/28/2020 16.7 (A) 11.5 - 15.5 %  Final   Platelets 11/28/2020 223  150 - 400 K/uL Final   nRBC 11/28/2020 0.0  0.0 - 0.2 % Final   Performed at [facility], [place] [address], [place], [place] [address]   WBC 11/29/2020 11.5 (A) 4.0 - 10.5 K/uL Final   RBC 11/29/2020 3.37 (A) 3.87 - 5.11 MIL/uL Final   Hemoglobin 11/29/2020 8.3 (A) 12.0 - 15.0 g/dL Final   HCT 11/29/2020 27.3 (A) 36.0 - 46.0 % Final   MCV 11/29/2020 81.0  80.0 - 100.0 fL Final   MCH 11/29/2020 24.6 (A) 26.0 - 34.0 pg Final   MCHC 11/29/2020 30.4  30.0 - 36.0 g/dL Final   RDW 11/29/2020 16.8 (A) 11.5 - 15.5 % Final   Platelets 11/29/2020 193  150 - 400 K/uL Final   nRBC 11/29/2020 0.0  0.0 - 0.2 % Final   Performed at [facility], [place] [address], [place], [place] [address]   Sodium 11/29/2020 139  135 - 145 mmol/L Final   Potassium 11/29/2020 4.6  3.5 - 5.1 mmol/L Final   Chloride 11/29/2020 104  98 - 111 mmol/L Final   CO2 11/29/2020 26  22 - 32 mmol/L Final   Glucose, Bld 11/29/2020 122 (A) 70 - 99 mg/dL Final   Glucose reference range applies only to samples taken after fasting for at least 8 hours.   BUN 11/29/2020 22  8 - 23 mg/dL Final   Creatinine,  Ser 11/29/2020 0.70  0.44 - 1.00 mg/dL Final   Calcium 11/29/2020 8.6 (A) 8.9 - 10.3 mg/dL Final   GFR, Estimated 11/29/2020 >60  >60 mL/min Final   Comment: (NOTE) Calculated using the CKD-EPI Creatinine Equation (2021)    Anion gap 11/29/2020 9  5 - 15 Final   Performed at [facility], [place] [address] [place], [place] [address]  Orders Only on 11/24/2020  Component Date Value Ref Range Status   SARS Coronavirus 2 11/24/2020 RESULT: NEGATIVE   Final   Comment: RESULT: NEGATIVESARS-CoV-2 INTERPRETATION:A NEGATIVE  test result means that SARS-CoV-2 RNA was not present in the specimen above the limit of detection of this test. This does not preclude a possible SARS-CoV-2 infection and should not be used as the  sole basis for patient management  decisions. Negative results must be combined with clinical observations, patient history, and epidemiological information. Optimum specimen types and timing for peak viral levels during infections caused by SARS-CoV-2  have not been determined. Collection of multiple specimens or types of specimens may be necessary to detect virus. Improper specimen collection and handling, sequence variability under primers/probes, or organism present below the limit of detection may  lead to false negative results. Positive and negative predictive values of testing are highly dependent on prevalence. False negative test results are more likely when prevalence of disease is high.The expected result is NEGATIVE.Fact S                          heet for  Healthcare Providers: LocalChronicle.no Sheet for Patients: SalonLookup.es Reference Range - Negative   Hospital Outpatient Visit on 11/16/2020  Component Date Value Ref Range Status   MRSA, PCR 11/16/2020 NEGATIVE  NEGATIVE Final   Staphylococcus aureus 11/16/2020 NEGATIVE  NEGATIVE Final   Comment: (NOTE) The Xpert SA Assay (FDA approved for NASAL specimens in patients 69 years of age and older), is one component of a comprehensive surveillance program. It is not intended to diagnose infection nor to guide or monitor treatment. Performed at [facility], [place] [address], [place], [place] [address]    ABO/RH(D) 11/16/2020 O NEG   Final   Antibody Screen 11/16/2020 NEG   Final   Sample Expiration 11/16/2020 11/30/2020,2359   Final   Extend sample reason 11/16/2020    Final                   Value:NO TRANSFUSIONS OR PREGNANCY IN THE PAST 3 MONTHS Performed at [facility] [address], [place], [place] [address]      X-Rays:No results found.  EKG: Orders placed or performed during the hospital encounter of 07/13/10   EKG     Hospital Course: [name] is a 65 y.o. who was admitted to [facility]. They were brought to the operating room on 11/28/2020 and underwent Procedure(s): TOTAL KNEE ARTHROPLASTY.  Patient tolerated the procedure well and was later transferred to the recovery room and then to the orthopaedic floor for postoperative care. They were given PO and IV analgesics for pain control following their surgery. They were given 24 hours of postoperative antibiotics of  Anti-infectives (From admission, onward)    Start     Dose/Rate Route Frequency Ordered Stop   11/28/20 1600  ceFAZolin (ANCEF) IVPB 2g/100 mL premix        2 g 200 mL/hr over 30 Minutes Intravenous Every 6 hours 11/28/20 1315 11/28/20  2201   11/28/20 0730  ceFAZolin (ANCEF) IVPB 2g/100 mL premix        2 g 200 mL/hr over 30 Minutes Intravenous On call to O.R. 11/28/20 0725 11/28/20 1003      and started on DVT prophylaxis in the form of Aspirin.   PT and OT were ordered for total joint protocol. Discharge planning consulted to help with postop disposition and equipment needs.  Patient had a good night on the evening of surgery. They started to get up OOB with therapy on POD #0. Pt was seen during rounds and was ready to go home pending progress with therapy.She worked with therapy on POD #1 and was meeting her goals. Pt was discharged to home later that day in stable condition.  Diet: Regular diet Activity: WBAT Follow-up: in 2 weeks Disposition: Home Discharged Condition: good   Discharge Instructions     Call MD / Call 911   Complete by: As directed    If you experience chest pain or shortness of breath, CALL 911 and be transported to the hospital emergency room.  If you develope a fever above 101 F, pus (white drainage) or increased drainage or redness at the wound, or calf pain, call your surgeon's office.   Change dressing   Complete by: As directed    Maintain surgical dressing until follow up in the clinic. If the edges start to pull up,  may reinforce with tape. If the dressing is no longer working, may remove and cover with gauze and tape, but must keep the area dry and clean.  Call with any questions or concerns.   Constipation Prevention   Complete by: As directed    Drink plenty of fluids.  Prune juice may be helpful.  You may use a stool softener, such as Colace (over the counter) 100 mg twice a day.  Use MiraLax (over the counter) for constipation as needed.   Diet - low sodium heart healthy   Complete by: As directed    Increase activity slowly as tolerated   Complete by: As directed    Weight bearing as tolerated with assist device (walker, cane, etc) as directed, use it as long as suggested by your surgeon or therapist, typically at least 4-6 weeks.   Post-operative opioid taper instructions:   Complete by: As directed    POST-OPERATIVE OPIOID TAPER INSTRUCTIONS: It is important to wean off of your opioid medication as soon as possible. If you do not need pain medication after your surgery it is ok to stop day one. Opioids include: Codeine, Hydrocodone(Norco, Vicodin), Oxycodone(Percocet, oxycontin) and hydromorphone amongst others.  Long term and even short term use of opiods can cause: Increased pain response Dependence Constipation Depression Respiratory depression And more.  Withdrawal symptoms can include Flu like symptoms Nausea, vomiting And more Techniques to manage these symptoms Hydrate well Eat regular healthy meals Stay active Use relaxation techniques(deep breathing, meditating, yoga) Do Not substitute Alcohol to help with tapering If you have been on opioids for less than two weeks and do not have pain than it is ok to stop all together.  Plan to wean off of opioids This plan should start within one week post op of your joint replacement. Maintain the same interval or time between taking each dose and first decrease the dose.  Cut the total daily intake of opioids by one tablet each  day Next start to increase the time between doses. The last dose that should be eliminated is the  evening dose.      TED hose   Complete by: As directed    Use stockings (TED hose) for 2 weeks on both leg(s).  You may remove them at night for sleeping.      Allergies as of 11/29/2020       Reactions   Mango Flavor [flavoring Agent] Anaphylaxis   Specifically mango peel , patient can eat the fruit itself   Oxycontin [oxycodone]    Dizziness        Medication List     STOP taking these medications    diclofenac 75 MG EC tablet Commonly known as: VOLTAREN   traMADol 50 MG tablet Commonly known as: ULTRAM       TAKE these medications    Advair Diskus 250-50 MCG/ACT Aepb Generic drug: fluticasone-salmeterol Inhale 2 puffs into the lungs 2 (two) times daily.   albuterol 108 (90 Base) MCG/ACT inhaler Commonly known as: VENTOLIN HFA Inhale 2 puffs into the lungs every 6 (six) hours as needed for wheezing or shortness of breath.   aspirin 81 MG chewable tablet Chew 1 tablet (81 mg total) by mouth 2 (two) times daily for 28 days.   calcium carbonate 500 MG chewable tablet Commonly known as: TUMS - dosed in mg elemental calcium Chew 1,000 mg by mouth daily as needed for indigestion or heartburn.   celecoxib 200 MG capsule Commonly known as: CELEBREX Take 1 capsule (200 mg total) by mouth 2 (two) times daily.   cetirizine 10 MG tablet Commonly known as: ZYRTEC Take 10 mg by mouth 2 (two) times daily.   docusate sodium 100 MG capsule Commonly known as: COLACE Take 1 capsule (100 mg total) by mouth 2 (two) times daily.   Estring 2 MG vaginal ring Generic drug: estradiol Place 2 mg vaginally every 3 (three) months.   hydrochlorothiazide 25 MG tablet Commonly known as: HYDRODIURIL Take 25 mg by mouth every morning.   HYDROmorphone 2 MG tablet Commonly known as: DILAUDID Take 1-2 tablets (2-4 mg total) by mouth every 4 (four) hours as needed for severe pain.    Magnesium 400 MG Tabs Take 400 mg by mouth daily.   methocarbamol 500 MG tablet Commonly known as: ROBAXIN Take 1 tablet (500 mg total) by mouth every 6 (six) hours as needed for muscle spasms.   polyethylene glycol 17 g packet Commonly known as: MIRALAX / GLYCOLAX Take 17 g by mouth daily as needed for mild constipation.   sertraline 100 MG tablet Commonly known as: ZOLOFT Take 150 mg by mouth every morning.   vitamin C 1000 MG tablet Take 1,000 mg by mouth daily.   Vitamin D 50 MCG (2000 UT) tablet Take 2,000 Units by mouth 2 (two) times daily.               Discharge Care Instructions  (From admission, onward)           Start     Ordered   11/29/20 0000  Change dressing       Comments: Maintain surgical dressing until follow up in the clinic. If the edges start to pull up, may reinforce with tape. If the dressing is no longer working, may remove and cover with gauze and tape, but must keep the area dry and clean.  Call with any questions or concerns.   11/29/20 3267            Follow-up Information     Paralee Cancel, MD. Schedule an appointment as soon  as possible for a visit in 2 week(s).   Specialty: Orthopedic Surgery Contact information: [address] [place] [address] [phone]                 Signed: Griffith Citron, PA-C Orthopedic Surgery 12/03/2020, 8:07 AM

## 2020-12-05 ENCOUNTER — Other Ambulatory Visit: Payer: Self-pay

## 2020-12-05 ENCOUNTER — Encounter (HOSPITAL_BASED_OUTPATIENT_CLINIC_OR_DEPARTMENT_OTHER): Payer: Self-pay | Admitting: Physical Therapy

## 2020-12-05 ENCOUNTER — Ambulatory Visit (HOSPITAL_BASED_OUTPATIENT_CLINIC_OR_DEPARTMENT_OTHER): Payer: Medicare PPO | Attending: Student | Admitting: Physical Therapy

## 2020-12-05 DIAGNOSIS — M25661 Stiffness of right knee, not elsewhere classified: Secondary | ICD-10-CM

## 2020-12-05 DIAGNOSIS — R600 Localized edema: Secondary | ICD-10-CM

## 2020-12-05 DIAGNOSIS — M25561 Pain in right knee: Secondary | ICD-10-CM

## 2020-12-05 DIAGNOSIS — R262 Difficulty in walking, not elsewhere classified: Secondary | ICD-10-CM

## 2020-12-05 NOTE — Therapy (Signed)
OUTPATIENT PHYSICAL THERAPY TREATMENT NOTE   Patient Name: [name] MRN: [id number] DOB:Jan 26, 1956, 65 y.o., female Today's Date: 12/05/2020  PCP: No primary care provider on file. REFERRING PROVIDER: Irving Copas, PA-C   PT End of Session - 12/05/20 0803     Visit Number 2    Number of Visits 21    Date for PT Re-Evaluation 02/09/21    Authorization Type [employer] MCR Adv    Progress Note Due on Visit 10    PT Start Time 0800    PT Stop Time 0851    PT Time Calculation (min) 51 min    Activity Tolerance Patient tolerated treatment well    Behavior During Therapy WFL for tasks assessed/performed             Past Medical History:  Diagnosis Date   Arthritis    Asthma    Depression    Goiter    Hypertension    Past Surgical History:  Procedure Laterality Date   CHOLECYSTECTOMY     FOOT SURGERY     KNEE SURGERY Left    TKA x2    1974, 2014   SHOULDER SURGERY     TOTAL HIP ARTHROPLASTY Left    TOTAL KNEE ARTHROPLASTY Right 11/28/2020   Procedure: TOTAL KNEE ARTHROPLASTY;  Surgeon: Paralee Cancel, MD;  Location: [facility];  Service: Orthopedics;  Laterality: Right;   Patient Active Problem List   Diagnosis Date Noted   S/P total knee arthroplasty, right 11/28/2020    REFERRING DIAG: S/p Rt TKA  THERAPY DIAG:  Acute pain of right knee  Stiffness of right knee, not elsewhere classified  Difficulty in walking, not elsewhere classified  Localized edema  PERTINENT HISTORY: Lt TKA x2, Lt THA, Rt TKA  PRECAUTIONS: none  SUBJECTIVE: Just the usual discomfort  PAIN:  Are you having pain? Yes VAS scale: 5/10 Pain location: Rt knee Pain orientation: Right  PAIN TYPE: aching Pain description: constant  Aggravating factors: constant stiffness and ache Relieving factors: ice, stretching    OBJECTIVE: Rt knee extension -10, flexion 94  TODAY'S TREATMENT: 10/4: Nu step 5 min L3 UE & LE Quad set SLR Gait training- toe off with knee flexion in  swing through SL hip abd x20 Heel slides- active x10, passive with strap Abdominal engagement- cues to reduce rib cage flare Vaso 15 min, 32 deg, low compression, rt knee  PATIENT EDUCATION:  Education details: exercise form/rationale Person educated: Patient Education method: Explanation, Demonstration, Tactile cues, Verbal cues Education comprehension: verbalized understanding, returned demonstration, verbal cues required, tactile cues required, and needs further education     HOME EXERCISE PROGRAM: [email or web address]   ASSESSMENT:   CLINICAL IMPRESSION: Already notable improvements in knee extension ROM presented today. Was able to demo good toe off and knee flexion in swing through with cuing. Lacking knee ext at heel strike but will cont to improve.    REHAB POTENTIAL: Good   CLINICAL DECISION MAKING: Stable/uncomplicated   EVALUATION COMPLEXITY: Low     GOALS: Goals reviewed with patient? Yes   SHORT TERM GOALS:   STG Name Target Date Goal status  1 Knee ext to 0 deg Baseline: -20 at eval 10/21     INITIAL  LONG TERM GOALS:    LTG Name Target Date Goal status  1 ROM 0-120 Baseline: -20-80 02/09/2021 INITIAL  2 Demo heel- toe gait pattern without AD Baseline: RW at eval 02/09/2021 INITIAL  3 Able to navigate stairs step over step with unilateral hand rail Baseline: unable at eval 02/09/2021 INITIAL  4 Able to complete ADLs pain <=2/10 Baseline:severe and limiting at eval 02/09/2021 INITIAL                               PLAN: PT FREQUENCY: 2x/week   PT DURATION: 10 weeks   PLANNED INTERVENTIONS: Therapeutic exercises, Therapeutic activity, Neuro Muscular re-education, Balance training, Gait training, Patient/Family education, Joint mobilization, Stair training, Aquatic Therapy, Cryotherapy, Taping, and Manual therapy   PLAN FOR NEXT SESSION: cont ROM, gait training as tol Alys Dulak C. Ragan Reale PT,  DPT 12/05/20 8:56 AM

## 2020-12-08 ENCOUNTER — Ambulatory Visit (HOSPITAL_BASED_OUTPATIENT_CLINIC_OR_DEPARTMENT_OTHER): Payer: Medicare PPO | Admitting: Physical Therapy

## 2020-12-08 ENCOUNTER — Encounter (HOSPITAL_BASED_OUTPATIENT_CLINIC_OR_DEPARTMENT_OTHER): Payer: Self-pay | Admitting: Physical Therapy

## 2020-12-08 ENCOUNTER — Other Ambulatory Visit: Payer: Self-pay

## 2020-12-08 DIAGNOSIS — R600 Localized edema: Secondary | ICD-10-CM

## 2020-12-08 DIAGNOSIS — M25661 Stiffness of right knee, not elsewhere classified: Secondary | ICD-10-CM

## 2020-12-08 DIAGNOSIS — M25561 Pain in right knee: Secondary | ICD-10-CM

## 2020-12-08 DIAGNOSIS — R262 Difficulty in walking, not elsewhere classified: Secondary | ICD-10-CM

## 2020-12-08 NOTE — Therapy (Signed)
OUTPATIENT PHYSICAL THERAPY TREATMENT NOTE   Patient Name: [name] MRN: [id number] DOB:1956/01/04, 65 y.o., female Today's Date: 12/08/2020  PCP: Merryl Hacker, No REFERRING PROVIDER: Irving Copas, PA-C   PT End of Session - 12/08/20 1147     Visit Number 3    Number of Visits 21    Date for PT Re-Evaluation 02/09/21    Authorization Type [employer] MCR Adv    Progress Note Due on Visit 10    PT Start Time 3338    PT Stop Time 1234    PT Time Calculation (min) 49 min    Activity Tolerance Patient tolerated treatment well    Behavior During Therapy WFL for tasks assessed/performed             Past Medical History:  Diagnosis Date   Arthritis    Asthma    Depression    Goiter    Hypertension    Past Surgical History:  Procedure Laterality Date   CHOLECYSTECTOMY     FOOT SURGERY     KNEE SURGERY Left    TKA x2    1974, 2014   SHOULDER SURGERY     TOTAL HIP ARTHROPLASTY Left    TOTAL KNEE ARTHROPLASTY Right 11/28/2020   Procedure: TOTAL KNEE ARTHROPLASTY;  Surgeon: Paralee Cancel, MD;  Location: [facility];  Service: Orthopedics;  Laterality: Right;   Patient Active Problem List   Diagnosis Date Noted   S/P total knee arthroplasty, right 11/28/2020    REFERRING DIAG: S/p Rt TKA  THERAPY DIAG:  Acute pain of right knee  Stiffness of right knee, not elsewhere classified  Difficulty in walking, not elsewhere classified  Localized edema  PERTINENT HISTORY: Lt TKA x2, Lt THA, Rt TKA  PRECAUTIONS: none  SUBJECTIVE: I feel like I am progressing. No more walker but have the cane out if I need it. Put raised toilet seats away. I moved back uptairs and stood to take a shower and wash my hair for the first time yesterday.   PAIN:  Are you having pain? Yes VAS scale: 3/10 Pain location: Rt knee Pain orientation: Right  PAIN TYPE: aching Pain description: constant  Aggravating factors: constant stiffness and ache Relieving factors: ice,  stretching    OBJECTIVE: Rt knee extension -6, flexion 102  TODAY'S TREATMENT: 10/7:  Nu step 5 min L 4 Ue & Le  Supine quad sets  SLR  Heel slide with strap  Standing posture- then added heel raises  Gait training- decr heel whip, incr knee flexion at swing through  Step up on first step, bil hand rail  Vaso 15 min 34 deg low compression  10/4: Nu step 5 min L3 UE & LE Quad set SLR Gait training- toe off with knee flexion in swing through SL hip abd x20 Heel slides- active x10, passive with strap Abdominal engagement- cues to reduce rib cage flare Vaso 15 min, 32 deg, low compression, rt knee  PATIENT EDUCATION:  Education details: exercise form/rationale Person educated: Patient Education method: Explanation, Demonstration, Tactile cues, Verbal cues Education comprehension: verbalized understanding, returned demonstration, verbal cues required, tactile cues required, and needs further education     HOME EXERCISE PROGRAM: [email or web address]   ASSESSMENT:   CLINICAL IMPRESSION: Continues to make significant progress in ROM and gait independence. Mild heel whip noted and corrected in longer distances but tends to return in shorter distances and navigating obstacles.    REHAB POTENTIAL: Good   CLINICAL DECISION MAKING: Stable/uncomplicated   EVALUATION  COMPLEXITY: Low     GOALS: Goals reviewed with patient? Yes   SHORT TERM GOALS:   STG Name Target Date Goal status  1 Knee ext to 0 deg Baseline: -20 at eval 10/21     INITIAL                                                          LONG TERM GOALS:    LTG Name Target Date Goal status  1 ROM 0-120 Baseline: -20-80 02/09/2021 INITIAL  2 Demo heel- toe gait pattern without AD Baseline: RW at eval 02/09/2021 INITIAL  3 Able to navigate stairs step over step with unilateral hand rail Baseline: unable at eval 02/09/2021 INITIAL  4 Able to complete ADLs pain <=2/10 Baseline:severe and limiting at eval 02/09/2021  INITIAL                               PLAN: PT FREQUENCY: 2x/week   PT DURATION: 10 weeks   PLANNED INTERVENTIONS: Therapeutic exercises, Therapeutic activity, Neuro Muscular re-education, Balance training, Gait training, Patient/Family education, Joint mobilization, Stair training, Aquatic Therapy, Cryotherapy, Taping, and Manual therapy   PLAN FOR NEXT SESSION: cont ROM, gait training as tol   Randalyn Ahmed C. Shmiel Morton PT, DPT 12/08/20 12:23 PM

## 2020-12-11 ENCOUNTER — Other Ambulatory Visit: Payer: Self-pay

## 2020-12-11 ENCOUNTER — Encounter (HOSPITAL_BASED_OUTPATIENT_CLINIC_OR_DEPARTMENT_OTHER): Payer: Self-pay | Admitting: Physical Therapy

## 2020-12-11 ENCOUNTER — Ambulatory Visit (HOSPITAL_BASED_OUTPATIENT_CLINIC_OR_DEPARTMENT_OTHER): Payer: Medicare PPO | Admitting: Physical Therapy

## 2020-12-11 DIAGNOSIS — R262 Difficulty in walking, not elsewhere classified: Secondary | ICD-10-CM

## 2020-12-11 DIAGNOSIS — R600 Localized edema: Secondary | ICD-10-CM

## 2020-12-11 DIAGNOSIS — M25561 Pain in right knee: Secondary | ICD-10-CM

## 2020-12-11 DIAGNOSIS — M25661 Stiffness of right knee, not elsewhere classified: Secondary | ICD-10-CM

## 2020-12-11 NOTE — Therapy (Addendum)
OUTPATIENT PHYSICAL THERAPY TREATMENT NOTE   Patient Name: [name] MRN: [id number] DOB:21-Jul-1955, 65 y.o., female Today's Date: 12/11/2020  PCP: Merryl Hacker, No REFERRING PROVIDER: Irving Copas, PA-C   PT End of Session - 12/11/20 4098     Visit Number 4    Number of Visits 21    Date for PT Re-Evaluation 02/09/21    Authorization Type [employer] MCR Adv    Progress Note Due on Visit 10    PT Start Time 0802    PT Stop Time 0859    PT Time Calculation (min) 57 min    Activity Tolerance Patient tolerated treatment well    Behavior During Therapy WFL for tasks assessed/performed             Past Medical History:  Diagnosis Date   Arthritis    Asthma    Depression    Goiter    Hypertension    Past Surgical History:  Procedure Laterality Date   CHOLECYSTECTOMY     FOOT SURGERY     KNEE SURGERY Left    TKA x2    1974, 2014   SHOULDER SURGERY     TOTAL HIP ARTHROPLASTY Left    TOTAL KNEE ARTHROPLASTY Right 11/28/2020   Procedure: TOTAL KNEE ARTHROPLASTY;  Surgeon: Paralee Cancel, MD;  Location: [facility];  Service: Orthopedics;  Laterality: Right;   Patient Active Problem List   Diagnosis Date Noted   S/P total knee arthroplasty, right 11/28/2020    REFERRING DIAG: S/p Rt TKA  THERAPY DIAG:  Acute pain of right knee  Stiffness of right knee, not elsewhere classified  Difficulty in walking, not elsewhere classified  Localized edema  PERTINENT HISTORY: Lt TKA x2, Lt THA, Rt TKA  PRECAUTIONS: none  SUBJECTIVE:  Pt not using the cane unless she is in crowds.  Pt reports reduced swelling.  Pt denies any adverse effects after prior Rx and states it felt ok because she used ice at the end of Rx. She states she is taking less pain meds.  Pt reports she sets her watch and gets up every hour to walk the house.  Pt reports compliance with HEP.   PAIN:  Are you having pain? Yes VAS scale: 2/10 Pain location: Rt knee Pain orientation: Right  PAIN TYPE:  aching Aggravating factors: constant stiffness and ache Relieving factors: ice, stretching    OBJECTIVE: Rt knee AROM:  extension -6, flexion 101  TODAY'S TREATMENT: 10/7:  Therapeutic Exercise: Reviewed response to prior Rx, HEP compliance, current function, and pain level. Nu step 5 min L 4 Ue & Le  SLR 2x10 reps SAQ 2x10 reps Supine heel slides x 10 reps  Heel slide with strap x 10 reps  S/L hip abd x10 reps  LAQ x10 reps  Longsitting gastroc stretch with strap 2x20 sec      Manual Therapy:  -Grade I-II ant and post patellar mobs  -Pt received R knee flexion and extension PROM in supine and knee flexion PROM seated at EOT.  -gentle knee extension stretch with heel propped in PT's hand in supine   Modalities: (Not billed Time)  -Gameready applied to R knee for 15 min at 34 deg, low compression in supine to reduce pain and swelling    PATIENT EDUCATION:  Education details: exercise form/rationale.  ROM findings Person educated: Patient Education method: Explanation, Demonstration, Tactile cues, Verbal cues Education comprehension: verbalized understanding, returned demonstration, verbal cues required, tactile cues required, and needs further education  HOME EXERCISE PROGRAM: [email or web address]    ASSESSMENT:   CLINICAL IMPRESSION: Pt is making good progress with knee ROM and gait.  Pt is improving with both knee flexion and extension ROM overall but had the same extension measurement today and only 1 deg less with flexion.  Pt tolerated PROM and stretching well without c/o's.  She is ambulating without an AD.  Pt is motivated, compliant with HEP, and gives good effort with all exercises.  Pt performed exercises per protocol well with cuing and instruction for correct form.  Pt able to perform supine SLR well though has an extensor lag.  Pt responded well to Rx reporting no increased pain after Rx.  She should benefit from cont skilled PT services per protocol to address  ongoing goals and to restore desired level of function.    REHAB POTENTIAL: Good   CLINICAL DECISION MAKING: Stable/uncomplicated   EVALUATION COMPLEXITY: Low     GOALS: Goals reviewed with patient? Yes   SHORT TERM GOALS:   STG Name Target Date Goal status  1 Knee ext to 0 deg Baseline: -20 at eval 10/21     INITIAL                                                          LONG TERM GOALS:    LTG Name Target Date Goal status  1 ROM 0-120 Baseline: -20-80 02/09/2021 INITIAL  2 Demo heel- toe gait pattern without AD Baseline: RW at eval 02/09/2021 INITIAL  3 Able to navigate stairs step over step with unilateral hand rail Baseline: unable at eval 02/09/2021 INITIAL  4 Able to complete ADLs pain <=2/10 Baseline:severe and limiting at eval 02/09/2021 INITIAL                               PLAN: PT FREQUENCY: 2x/week   PT DURATION: 10 weeks   PLANNED INTERVENTIONS: Therapeutic exercises, Therapeutic activity, Neuro Muscular re-education, Balance training, Gait training, Patient/Family education, Joint mobilization, Stair training, Aquatic Therapy, Cryotherapy, Taping, and Manual therapy   PLAN FOR NEXT SESSION: cont with ROM and strengthening per TKR protocol, gait training as tol, ice   Selinda Michaels III PT, DPT 12/11/20 4:01 PM

## 2020-12-13 ENCOUNTER — Other Ambulatory Visit: Payer: Self-pay

## 2020-12-13 ENCOUNTER — Encounter (HOSPITAL_BASED_OUTPATIENT_CLINIC_OR_DEPARTMENT_OTHER): Payer: Self-pay | Admitting: Physical Therapy

## 2020-12-13 ENCOUNTER — Ambulatory Visit (HOSPITAL_BASED_OUTPATIENT_CLINIC_OR_DEPARTMENT_OTHER): Payer: Medicare PPO | Admitting: Physical Therapy

## 2020-12-13 DIAGNOSIS — M25661 Stiffness of right knee, not elsewhere classified: Secondary | ICD-10-CM

## 2020-12-13 DIAGNOSIS — M25561 Pain in right knee: Secondary | ICD-10-CM | POA: Diagnosis not present

## 2020-12-13 DIAGNOSIS — R262 Difficulty in walking, not elsewhere classified: Secondary | ICD-10-CM

## 2020-12-13 DIAGNOSIS — R600 Localized edema: Secondary | ICD-10-CM

## 2020-12-13 NOTE — Addendum Note (Signed)
Addendum  created 12/13/20 2051 by Belinda Block, MD   Clinical Note Signed, Intraprocedure Blocks edited, SmartForm saved

## 2020-12-13 NOTE — Therapy (Signed)
OUTPATIENT PHYSICAL THERAPY TREATMENT NOTE   Patient Name: [name] MRN: [id number] DOB:1955-07-05, 65 y.o., female Today's Date: 12/13/2020  PCP: Merryl Hacker, No REFERRING PROVIDER: Irving Copas, PA-C   PT End of Session - 12/13/20 0803     Visit Number 5    Number of Visits 21    Date for PT Re-Evaluation 02/09/21    Authorization Type [employer] M[employer] Adv    PT Start Time 0800    PT Stop Time 0845    PT Time Calculation (min) 45 min    Activity Tolerance Patient tolerated treatment well    Behavior During Therapy WFL for tasks assessed/performed             Past Medical History:  Diagnosis Date   Arthritis    Asthma    Depression    Goiter    Hypertension    Past Surgical History:  Procedure Laterality Date   CHOLECYSTECTOMY     FOOT SURGERY     KNEE SURGERY Left    TKA x2    1974, 2014   SHOULDER SURGERY     TOTAL HIP ARTHROPLASTY Left    TOTAL KNEE ARTHROPLASTY Right 11/28/2020   Procedure: TOTAL KNEE ARTHROPLASTY;  Surgeon: Paralee Cancel, MD;  Location: [facility];  Service: Orthopedics;  Laterality: Right;   Patient Active Problem List   Diagnosis Date Noted   S/P total knee arthroplasty, right 11/28/2020    REFERRING DIAG: S/p Rt TKA  THERAPY DIAG:  Acute pain of right knee  Stiffness of right knee, not elsewhere classified  Difficulty in walking, not elsewhere classified  Localized edema  PERTINENT HISTORY: Lt TKA x2, Lt THA, Rt TKA  PRECAUTIONS: none  SUBJECTIVE:  Yesterday I took 1 pain pill only. I feel like the bandage is pulling in uncomfortable places. I have been sleeping more.   PAIN:  Are you having pain? no VAS scale: 0/10 Pain location: Rt knee Pain orientation: Right  PAIN TYPE: aching Aggravating factors: constant stiffness and ache Relieving factors: ice, stretching    OBJECTIVE: Rt knee AROM:  extension -5, flexion 113  TODAY'S TREATMENT: 10/12:  Nu step 5 min L5 UE & LE  Seated hamstring stretch  LAQ  x10  Standing with bar for support- gastroc stretch, step taps, tandem balance, step over hurdle for heel-toe, retro stepping  Heel slide with strap  Vaso 10 min Rt knee end of session, 34 deg, low compression  10/7:  Therapeutic Exercise: Reviewed response to prior Rx, HEP compliance, current function, and pain level. Nu step 5 min L 4 Ue & Le  SLR 2x10 reps SAQ 2x10 reps Supine heel slides x 10 reps  Heel slide with strap x 10 reps  S/L hip abd x10 reps  LAQ x10 reps  Longsitting gastroc stretch with strap 2x20 sec      Manual Therapy:  -Grade I-II ant and post patellar mobs  -Pt received R knee flexion and extension PROM in supine and knee flexion PROM seated at EOT.  -gentle knee extension stretch with heel propped in PT's hand in supine   Modalities: (Not billed Time)  -Gameready applied to R knee for 15 min at 34 deg, low compression in supine to reduce pain and swelling    PATIENT EDUCATION:  Education details: exercise form/rationale.  ROM findings Person educated: Patient Education method: Explanation, Demonstration, Tactile cues, Verbal cues Education comprehension: verbalized understanding, returned demonstration, verbal cues required, tactile cues required, and needs further education  HOME EXERCISE PROGRAM: [email or web address]    ASSESSMENT:   CLINICAL IMPRESSION: Felt a little nauseous from AM meds today. Reminders for kyphotic posture correction. Tends to turnout Rt LE but corrects with VC. Cont to progress ROM and ambulation form- able to resolve antalgic gait with cues for heel-toe pattern. No evident quad lag from available extension in SLR   REHAB POTENTIAL: Good   CLINICAL DECISION MAKING: Stable/uncomplicated   EVALUATION COMPLEXITY: Low     GOALS: Goals reviewed with patient? Yes   SHORT TERM GOALS:   STG Name Target Date Goal status  1 Knee ext to 0 deg Baseline: -20 at eval 10/21     INITIAL                                                           LONG TERM GOALS:    LTG Name Target Date Goal status  1 ROM 0-120 Baseline: -20-80 02/09/2021 INITIAL  2 Demo heel- toe gait pattern without AD Baseline: RW at eval 02/09/2021 INITIAL  3 Able to navigate stairs step over step with unilateral hand rail Baseline: unable at eval 02/09/2021 INITIAL  4 Able to complete ADLs pain <=2/10 Baseline:severe and limiting at eval 02/09/2021 INITIAL                               PLAN: PT FREQUENCY: 2x/week   PT DURATION: 10 weeks   PLANNED INTERVENTIONS: Therapeutic exercises, Therapeutic activity, Neuro Muscular re-education, Balance training, Gait training, Patient/Family education, Joint mobilization, Stair training, Aquatic Therapy, Cryotherapy, Taping, and Manual therapy   PLAN FOR NEXT SESSION: cont with ROM and strengthening per TKR protocol, gait training as tol, ice   Madsen Riddle C. Caelyn Route PT, DPT 12/13/20 8:40 AM

## 2020-12-18 ENCOUNTER — Other Ambulatory Visit (HOSPITAL_BASED_OUTPATIENT_CLINIC_OR_DEPARTMENT_OTHER): Payer: Self-pay

## 2020-12-18 ENCOUNTER — Encounter (HOSPITAL_BASED_OUTPATIENT_CLINIC_OR_DEPARTMENT_OTHER): Payer: Self-pay | Admitting: Physical Therapy

## 2020-12-18 ENCOUNTER — Other Ambulatory Visit: Payer: Self-pay

## 2020-12-18 ENCOUNTER — Ambulatory Visit: Payer: Medicare PPO | Attending: Internal Medicine

## 2020-12-18 ENCOUNTER — Ambulatory Visit (HOSPITAL_BASED_OUTPATIENT_CLINIC_OR_DEPARTMENT_OTHER): Payer: Medicare PPO | Admitting: Physical Therapy

## 2020-12-18 DIAGNOSIS — M25561 Pain in right knee: Secondary | ICD-10-CM

## 2020-12-18 DIAGNOSIS — R600 Localized edema: Secondary | ICD-10-CM

## 2020-12-18 DIAGNOSIS — Z23 Encounter for immunization: Secondary | ICD-10-CM

## 2020-12-18 DIAGNOSIS — M25661 Stiffness of right knee, not elsewhere classified: Secondary | ICD-10-CM

## 2020-12-18 DIAGNOSIS — R262 Difficulty in walking, not elsewhere classified: Secondary | ICD-10-CM

## 2020-12-18 MED ORDER — INFLUENZA VAC A&B SA ADJ QUAD 0.5 ML IM PRSY
PREFILLED_SYRINGE | INTRAMUSCULAR | 0 refills | Status: DC
  Filled 2020-12-18: qty 0.5, 1d supply, fill #0

## 2020-12-18 MED ORDER — PFIZER COVID-19 VAC BIVALENT 30 MCG/0.3ML IM SUSP
INTRAMUSCULAR | 0 refills | Status: DC
  Filled 2020-12-18: qty 0.3, 1d supply, fill #0

## 2020-12-18 NOTE — Therapy (Signed)
OUTPATIENT PHYSICAL THERAPY TREATMENT NOTE   Patient Name: [name] MRN: [id number] DOB:1956-01-07, 65 y.o., female Today's Date: 12/18/2020  PCP: Merryl Hacker, No REFERRING PROVIDER: Irving Copas, PA-C   PT End of Session - 12/18/20 0848     Visit Number 6    Number of Visits 21    Date for PT Re-Evaluation 02/09/21    Authorization Type [employer] M[employer] Adv    Progress Note Due on Visit 10    PT Start Time 0846    PT Stop Time 0940    PT Time Calculation (min) 54 min    Activity Tolerance Patient tolerated treatment well    Behavior During Therapy WFL for tasks assessed/performed             Past Medical History:  Diagnosis Date   Arthritis    Asthma    Depression    Goiter    Hypertension    Past Surgical History:  Procedure Laterality Date   CHOLECYSTECTOMY     FOOT SURGERY     KNEE SURGERY Left    TKA x2    1974, 2014   SHOULDER SURGERY     TOTAL HIP ARTHROPLASTY Left    TOTAL KNEE ARTHROPLASTY Right 11/28/2020   Procedure: TOTAL KNEE ARTHROPLASTY;  Surgeon: Paralee Cancel, MD;  Location: [facility];  Service: Orthopedics;  Laterality: Right;   Patient Active Problem List   Diagnosis Date Noted   S/P total knee arthroplasty, right 11/28/2020    REFERRING DIAG: S/p Rt TKA  THERAPY DIAG:  Acute pain of right knee  Stiffness of right knee, not elsewhere classified  Difficulty in walking, not elsewhere classified  Localized edema  PERTINENT HISTORY: Lt TKA x2, Lt THA, Rt TKA  PRECAUTIONS: none  SUBJECTIVE:  Rx for Zofran because nausea is so limiting. PA was very pleased with progress.   PAIN:  Are you having pain? no VAS scale: 0/10 Pain location: Rt knee Pain orientation: Right  PAIN TYPE: aching Aggravating factors: constant stiffness and ache Relieving factors: ice, stretching    OBJECTIVE: Rt knee AROM:  extension -5, flexion 113  TODAY'S TREATMENT: 10/17  Nu step 5 min L5 UE & Le  Seated HSS  LAQ x20 2lb  SL arcs  SRL 3 rounds- 6  lifts neutral & ER  SLS- cues to shift weight to avoid sidebending  Vaso 15 min, 34 deg, med compression  10/12:  Nu step 5 min L5 UE & LE  Seated hamstring stretch  LAQ x10  Standing with bar for support- gastroc stretch, step taps, tandem balance, step over hurdle for heel-toe, retro stepping  Heel slide with strap  Vaso 10 min Rt knee end of session, 34 deg, low compression  10/7:  Therapeutic Exercise: Reviewed response to prior Rx, HEP compliance, current function, and pain level. Nu step 5 min L 4 Ue & Le  SLR 2x10 reps SAQ 2x10 reps Supine heel slides x 10 reps  Heel slide with strap x 10 reps  S/L hip abd x10 reps  LAQ x10 reps  Longsitting gastroc stretch with strap 2x20 sec      Manual Therapy:  -Grade I-II ant and post patellar mobs  -Pt received R knee flexion and extension PROM in supine and knee flexion PROM seated at EOT.  -gentle knee extension stretch with heel propped in PT's hand in supine   Modalities: (Not billed Time)  -Gameready applied to R knee for 15 min at 34 deg, low compression  in supine to reduce pain and swelling    PATIENT EDUCATION:  Education details: exercise form/rationale.   Person educated: Patient Education method: Explanation, Demonstration, Tactile cues, Verbal cues Education comprehension: verbalized understanding, returned demonstration, verbal cues required, tactile cues required, and needs further education     HOME EXERCISE PROGRAM: [email or web address]    ASSESSMENT:   CLINICAL IMPRESSION:  Progressed exercise for endurance challenges in [place]. Notable increase I gait speed. Pt recognizes deviations from form in exercise and requires little cuing for corrections. Will continue to monitor incision and adjust if it is not appropriate to start aquatics 2 weeks from today.    REHAB POTENTIAL: Good   CLINICAL DECISION MAKING: Stable/uncomplicated   EVALUATION COMPLEXITY: Low     GOALS: Goals reviewed with patient? Yes   SHORT  TERM GOALS:   STG Name Target Date Goal status  1 Knee ext to 0 deg Baseline: -20 at eval 10/21     INITIAL                                                          LONG TERM GOALS:    LTG Name Target Date Goal status  1 ROM 0-120 Baseline: -20-80 02/09/2021 INITIAL  2 Demo heel- toe gait pattern without AD Baseline: RW at eval 02/09/2021 INITIAL  3 Able to navigate stairs step over step with unilateral hand rail Baseline: unable at eval 02/09/2021 INITIAL  4 Able to complete ADLs pain <=2/10 Baseline:severe and limiting at eval 02/09/2021 INITIAL                               PLAN: PT FREQUENCY: 2x/week   PT DURATION: 10 weeks   PLANNED INTERVENTIONS: Therapeutic exercises, Therapeutic activity, Neuro Muscular re-education, Balance training, Gait training, Patient/Family education, Joint mobilization, Stair training, Aquatic Therapy, Cryotherapy, Taping, and Manual therapy   PLAN FOR NEXT SESSION: cont to prog balance, hip abd strength   Keairra Bardon C. Koleton Duchemin PT, DPT 12/18/20 10:48 AM

## 2020-12-18 NOTE — Progress Notes (Signed)
   Covid-19 Vaccination Clinic  Name:  [name]    MRN: [id number] DOB: 15-Aug-1955  12/18/2020  [name] was observed post Covid-19 immunization for 15 minutes without incident. She was provided with Vaccine Information Sheet and instruction to access the V-Safe system.   [name] was instructed to call 911 with any severe reactions post vaccine: Difficulty breathing  Swelling of face and throat  A fast heartbeat  A bad rash all over body  Dizziness and weakness

## 2020-12-22 ENCOUNTER — Ambulatory Visit (HOSPITAL_BASED_OUTPATIENT_CLINIC_OR_DEPARTMENT_OTHER): Payer: Medicare PPO | Admitting: Physical Therapy

## 2020-12-22 ENCOUNTER — Other Ambulatory Visit: Payer: Self-pay

## 2020-12-22 ENCOUNTER — Encounter (HOSPITAL_BASED_OUTPATIENT_CLINIC_OR_DEPARTMENT_OTHER): Payer: Self-pay | Admitting: Physical Therapy

## 2020-12-22 DIAGNOSIS — M25561 Pain in right knee: Secondary | ICD-10-CM

## 2020-12-22 DIAGNOSIS — R600 Localized edema: Secondary | ICD-10-CM

## 2020-12-22 DIAGNOSIS — R262 Difficulty in walking, not elsewhere classified: Secondary | ICD-10-CM

## 2020-12-22 DIAGNOSIS — M25661 Stiffness of right knee, not elsewhere classified: Secondary | ICD-10-CM

## 2020-12-22 NOTE — Therapy (Signed)
OUTPATIENT PHYSICAL THERAPY TREATMENT NOTE   Patient Name: [name] MRN: [id number] DOB:23-Feb-1956, 65 y.o., female Today's Date: 12/23/2020  PCP: Merryl Hacker, No REFERRING PROVIDER: Irving Copas, PA-C   PT End of Session - 12/22/20 1105     Visit Number 7    Number of Visits 21    Date for PT Re-Evaluation 02/09/21    Authorization Type [employer] M[employer] Adv    Progress Note Due on Visit 10    PT Start Time 1056    PT Stop Time 1540    PT Time Calculation (min) 38 min    Activity Tolerance Patient tolerated treatment well    Behavior During Therapy WFL for tasks assessed/performed             Past Medical History:  Diagnosis Date   Arthritis    Asthma    Depression    Goiter    Hypertension    Past Surgical History:  Procedure Laterality Date   CHOLECYSTECTOMY     FOOT SURGERY     KNEE SURGERY Left    TKA x2    1974, 2014   SHOULDER SURGERY     TOTAL HIP ARTHROPLASTY Left    TOTAL KNEE ARTHROPLASTY Right 11/28/2020   Procedure: TOTAL KNEE ARTHROPLASTY;  Surgeon: Paralee Cancel, MD;  Location: [facility];  Service: Orthopedics;  Laterality: Right;   Patient Active Problem List   Diagnosis Date Noted   S/P total knee arthroplasty, right 11/28/2020    REFERRING DIAG: S/p Rt TKA  THERAPY DIAG:  Acute pain of right knee  Stiffness of right knee, not elsewhere classified  Difficulty in walking, not elsewhere classified  Localized edema  PERTINENT HISTORY: Lt TKA x2, Lt THA, Rt TKA  PRECAUTIONS: none  SUBJECTIVE:  still taking zofran. Did a grocery store run and had a lot of throbbing last night. Getting the car loaded and going up and down stairs was exhausting.   PAIN:  Are you having pain? no VAS scale: 3/10 Pain location: Rt knee Pain orientation: Right  PAIN TYPE: throbbing Aggravating factors: constant stiffness and ache Relieving factors: ice, stretching    OBJECTIVE: Rt knee AROM:  extension -2, flexion 115  TODAY'S TREATMENT: 10/21  Nu  step 5 min L5 UE & LE  Manual: edema mobilization, passive flexion/ext, patellar mobs  Supine quad sets  Bike 2 min  Walk around downstairs loop  Shuttle 2*25 double leg press & Rt single leg press   10/17  Nu step 5 min L5 UE & Le  Seated HSS  LAQ x20 2lb  SL arcs  SRL 3 rounds- 6 lifts neutral & ER  SLS- cues to shift weight to avoid sidebending  Vaso 15 min, 34 deg, med compression  10/12:  Nu step 5 min L5 UE & LE  Seated hamstring stretch  LAQ x10  Standing with bar for support- gastroc stretch, step taps, tandem balance, step over hurdle for heel-toe, retro stepping  Heel slide with strap  Vaso 10 min Rt knee end of session, 34 deg, low compression  10/7:  Therapeutic Exercise: Reviewed response to prior Rx, HEP compliance, current function, and pain level. Nu step 5 min L 4 Ue & Le  SLR 2x10 reps SAQ 2x10 reps Supine heel slides x 10 reps  Heel slide with strap x 10 reps  S/L hip abd x10 reps  LAQ x10 reps  Longsitting gastroc stretch with strap 2x20 sec      Manual Therapy:  -  Grade I-II ant and post patellar mobs  -Pt received R knee flexion and extension PROM in supine and knee flexion PROM seated at EOT.  -gentle knee extension stretch with heel propped in PT's hand in supine   Modalities: (Not billed Time)  -Gameready applied to R knee for 15 min at 34 deg, low compression in supine to reduce pain and swelling    PATIENT EDUCATION:  Education details: exercise form/rationale.   Person educated: Patient Education method: Explanation, Demonstration, Tactile cues, Verbal cues Education comprehension: verbalized understanding, returned demonstration, verbal cues required, tactile cues required, and needs further education     HOME EXERCISE PROGRAM: [email or web address]    ASSESSMENT:   CLINICAL IMPRESSION:  Pt felt discouraged today but we discussed how soreness with increase in activity will feel like a set back. She is progressing very well this soon after  surgery. Is going OOT this weekend and was encouraged to continue with exercises. Reported a decrease in discomfort at the end of the session.    REHAB POTENTIAL: Good   CLINICAL DECISION MAKING: Stable/uncomplicated   EVALUATION COMPLEXITY: Low     GOALS: Goals reviewed with patient? Yes   SHORT TERM GOALS:   STG Name Target Date Goal status  1 Knee ext to 0 deg Baseline: -2 today, improved from -20 10/21     ongoing                                                          LONG TERM GOALS:    LTG Name Target Date Goal status  1 ROM 0-120 Baseline: -20-80 02/09/2021 INITIAL  2 Demo heel- toe gait pattern without AD Baseline: RW at eval 02/09/2021 INITIAL  3 Able to navigate stairs step over step with unilateral hand rail Baseline: unable at eval 02/09/2021 INITIAL  4 Able to complete ADLs pain <=2/10 Baseline:severe and limiting at eval 02/09/2021 INITIAL                               PLAN: PT FREQUENCY: 2x/week   PT DURATION: 10 weeks   PLANNED INTERVENTIONS: Therapeutic exercises, Therapeutic activity, Neuro Muscular re-education, Balance training, Gait training, Patient/Family education, Joint mobilization, Stair training, Aquatic Therapy, Cryotherapy, Taping, and Manual therapy   PLAN FOR NEXT SESSION: cont to prog balance, hip abd strength   Reiss Mowrey C. Hopelynn Gartland PT, DPT 12/23/20 11:43 AM

## 2020-12-23 ENCOUNTER — Encounter (HOSPITAL_BASED_OUTPATIENT_CLINIC_OR_DEPARTMENT_OTHER): Payer: Self-pay | Admitting: Physical Therapy

## 2020-12-24 NOTE — Therapy (Signed)
OUTPATIENT PHYSICAL THERAPY TREATMENT NOTE   Patient Name: [name] MRN: [id number] DOB:Sep 10, 1955, 65 y.o., female Today's Date: 12/25/2020  YHC:[id number] to see Jacolyn Reedy, NP REFERRING PROVIDER: Paralee Cancel, MD   PT End of Session - 12/25/20 0856     Visit Number 8    Number of Visits 21    Date for PT Re-Evaluation 02/09/21    Authorization Type [employer] M[employer] Adv    Progress Note Due on Visit 10    PT Start Time 0843    PT Stop Time 0940    PT Time Calculation (min) 57 min    Activity Tolerance Patient tolerated treatment well    Behavior During Therapy WFL for tasks assessed/performed              Past Medical History:  Diagnosis Date   Arthritis    Asthma    Depression    Goiter    Hypertension    Past Surgical History:  Procedure Laterality Date   CHOLECYSTECTOMY     FOOT SURGERY     KNEE SURGERY Left    TKA x2    1974, 2014   SHOULDER SURGERY     TOTAL HIP ARTHROPLASTY Left    TOTAL KNEE ARTHROPLASTY Right 11/28/2020   Procedure: TOTAL KNEE ARTHROPLASTY;  Surgeon: Paralee Cancel, MD;  Location: [facility];  Service: Orthopedics;  Laterality: Right;   Patient Active Problem List   Diagnosis Date Noted   S/P total knee arthroplasty, right 11/28/2020    REFERRING DIAG: S/p Rt TKA  THERAPY DIAG:  Acute pain of right knee  Stiffness of right knee, not elsewhere classified  Difficulty in walking, not elsewhere classified  Localized edema  PERTINENT HISTORY: Lt TKA x2, Lt THA, Rt TKA  PRECAUTIONS: none  SUBJECTIVE:  knee feels fine, I am having a hard time with my breathing   PAIN:  Are you having pain? no VAS scale: 0/10 Pain location: Rt knee Pain orientation: Right  PAIN TYPE: throbbing Aggravating factors: constant stiffness and ache Relieving factors: ice, stretching    OBJECTIVE: Rt knee AROM:  extension 0, flexion 122  TODAY'S TREATMENT: 10/24  Nu step 6 min L4 LE only  Sit<>stand  Tandem balance with head turns  SLS-  worked on using stepping to regain balance rather than UEs  SLR x10  SL circles- small; arcs  Hooklying 90 to knee ext with abdominal engagement  VASO 15 min low compression 34 deg  10/21  Nu step 5 min L5 UE & LE  Manual: edema mobilization, passive flexion/ext, patellar mobs  Supine quad sets  Bike 2 min  Walk around downstairs loop  Shuttle 2*25 double leg press & Rt single leg press   10/17  Nu step 5 min L5 UE & Le  Seated HSS  LAQ x20 2lb  SL arcs  SRL 3 rounds- 6 lifts neutral & ER  SLS- cues to shift weight to avoid sidebending  Vaso 15 min, 34 deg, med compression  10/12:  Nu step 5 min L5 UE & LE  Seated hamstring stretch  LAQ x10  Standing with bar for support- gastroc stretch, step taps, tandem balance, step over hurdle for heel-toe, retro stepping  Heel slide with strap  Vaso 10 min Rt knee end of session, 34 deg, low compression  10/7:  Therapeutic Exercise: Reviewed response to prior Rx, HEP compliance, current function, and pain level. Nu step 5 min L 4 Ue & Le  SLR 2x10 reps SAQ  2x10 reps Supine heel slides x 10 reps  Heel slide with strap x 10 reps  S/L hip abd x10 reps  LAQ x10 reps  Longsitting gastroc stretch with strap 2x20 sec      Manual Therapy:  -Grade I-II ant and post patellar mobs  -Pt received R knee flexion and extension PROM in supine and knee flexion PROM seated at EOT.  -gentle knee extension stretch with heel propped in PT's hand in supine   Modalities: (Not billed Time)  -Gameready applied to R knee for 15 min at 34 deg, low compression in supine to reduce pain and swelling    PATIENT EDUCATION:  Education details: exercise form/rationale.   Person educated: Patient Education method: Explanation, Demonstration, Tactile cues, Verbal cues Education comprehension: verbalized understanding, returned demonstration, verbal cues required, tactile cues required, and needs further education     HOME EXERCISE PROGRAM: [email or web address]     ASSESSMENT:   CLINICAL IMPRESSION:   ROM goals achieved today wihtout increased discomfort. Notable balance difficulty in tandem and SLS- limited strength for hip abd reps will be addressed to assist in balance control.      REHAB POTENTIAL: Good   CLINICAL DECISION MAKING: Stable/uncomplicated   EVALUATION COMPLEXITY: Low     GOALS: Goals reviewed with patient? Yes   SHORT TERM GOALS:   STG Name Target Date Goal status  1 Knee ext to 0 deg Baseline: 0-122 10/21     Achieved 10/24                                                          LONG TERM GOALS:    LTG Name Target Date Goal status  1 ROM 0-120 Baseline: -20-80 02/09/2021 INITIAL  2 Demo heel- toe gait pattern without AD Baseline: RW at eval 02/09/2021 INITIAL  3 Able to navigate stairs step over step with unilateral hand rail Baseline: unable at eval 02/09/2021 INITIAL  4 Able to complete ADLs pain <=2/10 Baseline:severe and limiting at eval 02/09/2021 INITIAL                               PLAN: PT FREQUENCY: 2x/week   PT DURATION: 10 weeks   PLANNED INTERVENTIONS: Therapeutic exercises, Therapeutic activity, Neuro Muscular re-education, Balance training, Gait training, Patient/Family education, Joint mobilization, Stair training, Aquatic Therapy, Cryotherapy, Taping, and Manual therapy   PLAN FOR NEXT SESSION: cont balance with stepping catch; hip abd strength  Abegail Kloeppel C. Dessire Grimes PT, DPT 12/25/20 9:27 AM

## 2020-12-25 ENCOUNTER — Other Ambulatory Visit: Payer: Self-pay

## 2020-12-25 ENCOUNTER — Encounter (HOSPITAL_BASED_OUTPATIENT_CLINIC_OR_DEPARTMENT_OTHER): Payer: Medicare PPO | Attending: Student | Admitting: Physical Therapy

## 2020-12-25 ENCOUNTER — Encounter (HOSPITAL_BASED_OUTPATIENT_CLINIC_OR_DEPARTMENT_OTHER): Payer: Self-pay | Admitting: Physical Therapy

## 2020-12-25 DIAGNOSIS — R262 Difficulty in walking, not elsewhere classified: Secondary | ICD-10-CM | POA: Insufficient documentation

## 2020-12-25 DIAGNOSIS — M25661 Stiffness of right knee, not elsewhere classified: Secondary | ICD-10-CM | POA: Insufficient documentation

## 2020-12-25 DIAGNOSIS — M25561 Pain in right knee: Secondary | ICD-10-CM | POA: Diagnosis not present

## 2020-12-25 DIAGNOSIS — R600 Localized edema: Secondary | ICD-10-CM | POA: Insufficient documentation

## 2020-12-29 ENCOUNTER — Encounter (HOSPITAL_BASED_OUTPATIENT_CLINIC_OR_DEPARTMENT_OTHER): Payer: Self-pay | Admitting: Physical Therapy

## 2020-12-29 ENCOUNTER — Other Ambulatory Visit: Payer: Self-pay

## 2020-12-29 ENCOUNTER — Ambulatory Visit (HOSPITAL_BASED_OUTPATIENT_CLINIC_OR_DEPARTMENT_OTHER): Payer: Medicare PPO | Admitting: Physical Therapy

## 2020-12-29 DIAGNOSIS — M25661 Stiffness of right knee, not elsewhere classified: Secondary | ICD-10-CM

## 2020-12-29 DIAGNOSIS — M25561 Pain in right knee: Secondary | ICD-10-CM | POA: Diagnosis not present

## 2020-12-29 DIAGNOSIS — R600 Localized edema: Secondary | ICD-10-CM

## 2020-12-29 DIAGNOSIS — R262 Difficulty in walking, not elsewhere classified: Secondary | ICD-10-CM

## 2020-12-29 NOTE — Therapy (Signed)
OUTPATIENT PHYSICAL THERAPY TREATMENT NOTE   Patient Name: [name] MRN: [id number] DOB:1955/06/14, 65 y.o., female Today's Date: 12/29/2020  [id number] to see Jacolyn Reedy, NP REFERRING PROVIDER: Paralee Cancel, MD   PT End of Session - 12/29/20 0941     Visit Number 9    Number of Visits 21    Date for PT Re-Evaluation 02/09/21    Authorization Type [employer] M[employer] Adv    PT Start Time 0935    PT Stop Time 9678    PT Time Calculation (min) 39 min    Activity Tolerance Patient tolerated treatment well    Behavior During Therapy WFL for tasks assessed/performed              Past Medical History:  Diagnosis Date   Arthritis    Asthma    Depression    Goiter    Hypertension    Past Surgical History:  Procedure Laterality Date   CHOLECYSTECTOMY     FOOT SURGERY     KNEE SURGERY Left    TKA x2    1974, 2014   SHOULDER SURGERY     TOTAL HIP ARTHROPLASTY Left    TOTAL KNEE ARTHROPLASTY Right 11/28/2020   Procedure: TOTAL KNEE ARTHROPLASTY;  Surgeon: Paralee Cancel, MD;  Location: [facility];  Service: Orthopedics;  Laterality: Right;   Patient Active Problem List   Diagnosis Date Noted   S/P total knee arthroplasty, right 11/28/2020    REFERRING DIAG: S/p Rt TKA  THERAPY DIAG:  Acute pain of right knee  Stiffness of right knee, not elsewhere classified  Difficulty in walking, not elsewhere classified  Localized edema  PERTINENT HISTORY: Lt TKA x2, Lt THA, Rt TKA  PRECAUTIONS: none  SUBJECTIVE:  Pt is 4 weeks and 3 days s/p R TKA.  Pt denies any adverse effects after prior Rx.  Pt is not taking any pain meds.  Pt is sleeping without nocturnal distress.  Pt states she is working on her performance of stairs.  Pt ambulating without an AD.  Pt hasn't been using ice as much at home and states she doesn't think she needs ice today.  PAIN:  Are you having pain? no VAS scale: 0/10 Pain location: Rt knee     OBJECTIVE: Rt knee AROM:  extension 2, flexion 113  deg (before stretching).  Supine R knee flexion AAROM:  124 deg  TODAY'S TREATMENT: Therapeutic Exercise:  Reviewed response to prior Rx, current function, and pain level.  Assessed Knee ROM  Nu step 5 min L4 LEs only  Supine SLR 2 x10 Supine heel slides with strap x 10 reps LAQ with 3# 2x10 reps Seated HS curls with YTB 2x10 reps Standing TKE with RTB 2x10 reps         Manual Therapy:  -Grade I-II ant and post patellar mobs and grade II PA jt mobs to R knee to improve stiffness and mobility and normalize arthrokinematics  -Pt received R knee flexion and extension PROM in supine  -gentle knee extension stretch with heel propped in PT's hand in supine   Pt declined ice today.    PATIENT EDUCATION:  Education details: exercise form/rationale.  POC Person educated: Patient Education method: Explanation, Demonstration, Tactile cues, Verbal cues Education comprehension: verbalized understanding, returned demonstration, verbal cues required, tactile cues required, and needs further education     HOME EXERCISE PROGRAM: [email or web address]    ASSESSMENT:   CLINICAL IMPRESSION:   Pt is progressing well with ROM, strength, gait, and  protocol.  She was lacking 2 deg of extension today with ROM assessed prior to stretching.  Pt tolerates stretching well and was able to easily achieve 0 deg of extension passively.  Pt performed exercises per protocol well with cuing for correct form.  Pt declined step ups and further exercises stating she was done for today.  Pt states her knee feels good.  She responded well to Rx having no increased pain after Rx.  Pt should cont to benefit from cont skilled PT services per protocol to address ongoing goals and to restore desired level of function.    REHAB POTENTIAL: Good   CLINICAL DECISION MAKING: Stable/uncomplicated   EVALUATION COMPLEXITY: Low     GOALS:   SHORT TERM GOALS:   STG Name Target Date Goal status  1 Knee ext to 0 deg Baseline: 0-122  10/21     Achieved 10/24                                                          LONG TERM GOALS:    LTG Name Target Date Goal status  1 ROM 0-120 Baseline: -20-80 02/09/2021 INITIAL  2 Demo heel- toe gait pattern without AD Baseline: RW at eval 02/09/2021 INITIAL  3 Able to navigate stairs step over step with unilateral hand rail Baseline: unable at eval 02/09/2021 INITIAL  4 Able to complete ADLs pain <=2/10 Baseline:severe and limiting at eval 02/09/2021 INITIAL                               PLAN: PT FREQUENCY: 2x/week   PT DURATION: 10 weeks   PLANNED INTERVENTIONS: Therapeutic exercises, Therapeutic activity, Neuro Muscular re-education, Balance training, Gait training, Patient/Family education, Joint mobilization, Stair training, Aquatic Therapy, Cryotherapy, Taping, and Manual therapy   PLAN FOR NEXT SESSION: cont with there ex, MT, and proprio per protocol.  Ice as needed to reduce pain and swelling.   Selinda Michaels III PT, DPT 12/29/20 12:12 PM

## 2020-12-31 NOTE — Therapy (Signed)
OUTPATIENT PHYSICAL THERAPY TREATMENT NOTE/Progress note/Discharge Progress Note Reporting Period 12/01/20 to 01/01/2021   See note below for Objective Data and Assessment of Progress/Goals.       Patient Name: [name] MRN: [id number] DOB:[birth year], 65 y.o., female Today's Date: 01/01/2021  CBJ:[id number] to see Jacolyn Reedy, NP REFERRING PROVIDER: Paralee Cancel, MD   PT End of Session - 01/01/21 1227     Visit Number 10    Number of Visits 21    Date for PT Re-Evaluation 02/09/21    Authorization Type [employer] M[employer] Adv    Progress Note Due on Visit 20    PT Start Time 1225    PT Stop Time 1607    PT Time Calculation (min) 40 min    Activity Tolerance Patient tolerated treatment well    Behavior During Therapy WFL for tasks assessed/performed               Past Medical History:  Diagnosis Date   Arthritis    Asthma    Depression    Goiter    Hypertension    Past Surgical History:  Procedure Laterality Date   CHOLECYSTECTOMY     FOOT SURGERY     KNEE SURGERY Left    TKA x2    1974, 2014   SHOULDER SURGERY     TOTAL HIP ARTHROPLASTY Left    TOTAL KNEE ARTHROPLASTY Right 11/28/2020   Procedure: TOTAL KNEE ARTHROPLASTY;  Surgeon: Paralee Cancel, MD;  Location: [facility];  Service: Orthopedics;  Laterality: Right;   Patient Active Problem List   Diagnosis Date Noted   S/P total knee arthroplasty, right 11/28/2020    REFERRING DIAG: S/p Rt TKA  THERAPY DIAG:  Acute pain of right knee  Stiffness of right knee, not elsewhere classified  Difficulty in walking, not elsewhere classified  Localized edema  PERTINENT HISTORY: Lt TKA x2, Lt THA, Rt TKA  PRECAUTIONS: none  SUBJECTIVE:  Knee is feeling pretty good.   PAIN:  Are you having pain? no VAS scale: 0/10 Pain location: Rt knee     OBJECTIVE: 0-120 active in supine following nustep    Rt sidelying hip abd 4/5  TODAY'S TREATMENT: 10/31  Nustep 5 min L4 Le only  Sidelying hip abd, hip  flexion/ext  Lateral step downs 4" step- Lt hand holding chair  Manual scar mobility  HEP review & added exercises for aquatic focus  Therapeutic Exercise:  Reviewed response to prior Rx, current function, and pain level.  Assessed Knee ROM  Nu step 5 min L4 LEs only  Supine SLR 2 x10 Supine heel slides with strap x 10 reps LAQ with 3# 2x10 reps Seated HS curls with YTB 2x10 reps Standing TKE with RTB 2x10 reps         Manual Therapy:  -Grade I-II ant and post patellar mobs and grade II PA jt mobs to R knee to improve stiffness and mobility and normalize arthrokinematics  -Pt received R knee flexion and extension PROM in supine  -gentle knee extension stretch with heel propped in PT's hand in supine   Pt declined ice today.    PATIENT EDUCATION:  Education details: exercise form/rationale, importance of continued HEP Person educated: Patient Education method: Explanation, Demonstration, Tactile cues, Verbal cues Education comprehension: verbalized understanding     HOME EXERCISE PROGRAM: [email or web address]    ASSESSMENT:   CLINICAL IMPRESSION:   Pt has met all of her goals at this time with the exception to descending stairs step  over step- this is not limited by knee pain, rather by vestibular issues and fear of heights. Discussed how exercises can be modified for the pool and added these to her HEP app. Mild limitations in distal 1/4 scar mobility and she will continue to work on this. Encouraged her to contact me with any further questions.      REHAB POTENTIAL: Good   CLINICAL DECISION MAKING: Stable/uncomplicated   EVALUATION COMPLEXITY: Low     GOALS:   SHORT TERM GOALS:   STG Name Target Date Goal status  1 Knee ext to 0 deg Baseline: 0-122 10/21     Achieved 10/24                                                          LONG TERM GOALS:    LTG Name Target Date Goal status  1 ROM 0-120 Baseline: 0-120 02/09/2021 achieved  2 Demo heel- toe gait  pattern without AD Baseline: no AD 02/09/2021 achieved  3 Able to navigate stairs step over step with unilateral hand rail Baseline: going up is ok- fear of heights and ringing in Rt ear so I lead with one foot 02/09/2021 Partially met  4 Able to complete ADLs pain <=2/10 Baseline: no pain 02/09/2021 achieved                               PLAN: PT FREQUENCY: 2x/week   PT DURATION: 10 weeks   PLANNED INTERVENTIONS: Therapeutic exercises, Therapeutic activity, Neuro Muscular re-education, Balance training, Gait training, Patient/Family education, Joint mobilization, Stair training, Aquatic Therapy, Cryotherapy, Taping, and Manual therapy   PLAN FOR NEXT SESSION: n/a PHYSICAL THERAPY DISCHARGE SUMMARY  Visits from Start of Care: 10  Current functional level related to goals / functional outcomes: See above   Remaining deficits: See above   Education / Equipment: Anatomy of condition, POC, HEP, exercise form/rationale   Patient agrees to discharge. Patient goals were met. Patient is being discharged due to meeting the stated rehab goals.  [place] C. Dorris Pierre PT, DPT 01/01/21 1:21 PM

## 2021-01-01 ENCOUNTER — Ambulatory Visit (HOSPITAL_BASED_OUTPATIENT_CLINIC_OR_DEPARTMENT_OTHER): Payer: Self-pay | Admitting: Physical Therapy

## 2021-01-01 ENCOUNTER — Encounter (HOSPITAL_BASED_OUTPATIENT_CLINIC_OR_DEPARTMENT_OTHER): Payer: Self-pay | Admitting: Physical Therapy

## 2021-01-01 ENCOUNTER — Ambulatory Visit (HOSPITAL_BASED_OUTPATIENT_CLINIC_OR_DEPARTMENT_OTHER): Payer: Medicare PPO | Admitting: Physical Therapy

## 2021-01-01 ENCOUNTER — Other Ambulatory Visit: Payer: Self-pay

## 2021-01-01 DIAGNOSIS — R600 Localized edema: Secondary | ICD-10-CM

## 2021-01-01 DIAGNOSIS — R262 Difficulty in walking, not elsewhere classified: Secondary | ICD-10-CM

## 2021-01-01 DIAGNOSIS — M25661 Stiffness of right knee, not elsewhere classified: Secondary | ICD-10-CM

## 2021-01-01 DIAGNOSIS — M25561 Pain in right knee: Secondary | ICD-10-CM

## 2021-01-03 ENCOUNTER — Ambulatory Visit (HOSPITAL_BASED_OUTPATIENT_CLINIC_OR_DEPARTMENT_OTHER): Payer: Self-pay | Admitting: Physical Therapy

## 2021-01-03 ENCOUNTER — Encounter (HOSPITAL_BASED_OUTPATIENT_CLINIC_OR_DEPARTMENT_OTHER): Payer: Medicare PPO | Admitting: Physical Therapy

## 2021-01-03 ENCOUNTER — Encounter (HOSPITAL_BASED_OUTPATIENT_CLINIC_OR_DEPARTMENT_OTHER): Payer: Self-pay

## 2021-01-08 ENCOUNTER — Ambulatory Visit (HOSPITAL_BASED_OUTPATIENT_CLINIC_OR_DEPARTMENT_OTHER): Payer: Self-pay | Admitting: Physical Therapy

## 2021-01-11 ENCOUNTER — Ambulatory Visit (HOSPITAL_BASED_OUTPATIENT_CLINIC_OR_DEPARTMENT_OTHER): Payer: Self-pay | Admitting: Physical Therapy

## 2021-01-15 ENCOUNTER — Ambulatory Visit (HOSPITAL_BASED_OUTPATIENT_CLINIC_OR_DEPARTMENT_OTHER): Payer: Self-pay | Admitting: Physical Therapy

## 2021-01-17 ENCOUNTER — Encounter (HOSPITAL_BASED_OUTPATIENT_CLINIC_OR_DEPARTMENT_OTHER): Payer: Self-pay | Admitting: Nurse Practitioner

## 2021-01-19 ENCOUNTER — Ambulatory Visit (HOSPITAL_BASED_OUTPATIENT_CLINIC_OR_DEPARTMENT_OTHER): Payer: Self-pay | Admitting: Physical Therapy

## 2021-01-22 ENCOUNTER — Ambulatory Visit (HOSPITAL_BASED_OUTPATIENT_CLINIC_OR_DEPARTMENT_OTHER): Payer: Self-pay | Admitting: Physical Therapy

## 2021-01-23 ENCOUNTER — Encounter (HOSPITAL_BASED_OUTPATIENT_CLINIC_OR_DEPARTMENT_OTHER): Payer: Self-pay | Admitting: Nurse Practitioner

## 2021-01-23 ENCOUNTER — Ambulatory Visit (HOSPITAL_BASED_OUTPATIENT_CLINIC_OR_DEPARTMENT_OTHER): Payer: Medicare PPO | Admitting: Nurse Practitioner

## 2021-01-23 ENCOUNTER — Other Ambulatory Visit: Payer: Self-pay

## 2021-01-23 VITALS — BP 122/80 | HR 82 | Ht 64.0 in | Wt 200.0 lb

## 2021-01-23 DIAGNOSIS — Z1211 Encounter for screening for malignant neoplasm of colon: Secondary | ICD-10-CM | POA: Insufficient documentation

## 2021-01-23 DIAGNOSIS — R5383 Other fatigue: Secondary | ICD-10-CM

## 2021-01-23 DIAGNOSIS — D508 Other iron deficiency anemias: Secondary | ICD-10-CM

## 2021-01-23 DIAGNOSIS — H65191 Other acute nonsuppurative otitis media, right ear: Secondary | ICD-10-CM | POA: Insufficient documentation

## 2021-01-23 DIAGNOSIS — H9311 Tinnitus, right ear: Secondary | ICD-10-CM | POA: Insufficient documentation

## 2021-01-23 DIAGNOSIS — R0602 Shortness of breath: Secondary | ICD-10-CM | POA: Insufficient documentation

## 2021-01-23 DIAGNOSIS — Z0000 Encounter for general adult medical examination without abnormal findings: Secondary | ICD-10-CM | POA: Insufficient documentation

## 2021-01-23 LAB — CBC WITH DIFFERENTIAL/PLATELET
Basophils Absolute: 0.1 10*3/uL (ref 0.0–0.2)
Basos: 1 %
EOS (ABSOLUTE): 0.4 10*3/uL (ref 0.0–0.4)
Eos: 6 %
Hematocrit: 30.8 % — ABNORMAL LOW (ref 34.0–46.6)
Hemoglobin: 9.2 g/dL — ABNORMAL LOW (ref 11.1–15.9)
Immature Grans (Abs): 0 10*3/uL (ref 0.0–0.1)
Immature Granulocytes: 0 %
Lymphocytes Absolute: 1.2 10*3/uL (ref 0.7–3.1)
Lymphs: 19 %
MCH: 23.1 pg — ABNORMAL LOW (ref 26.6–33.0)
MCHC: 29.9 g/dL — ABNORMAL LOW (ref 31.5–35.7)
MCV: 77 fL — ABNORMAL LOW (ref 79–97)
Monocytes Absolute: 0.7 10*3/uL (ref 0.1–0.9)
Monocytes: 12 %
Neutrophils Absolute: 3.9 10*3/uL (ref 1.4–7.0)
Neutrophils: 62 %
Platelets: 313 10*3/uL (ref 150–450)
RBC: 3.99 x10E6/uL (ref 3.77–5.28)
RDW: 15.2 % (ref 11.7–15.4)
WBC: 6.2 10*3/uL (ref 3.4–10.8)

## 2021-01-23 MED ORDER — ALBUTEROL SULFATE HFA 108 (90 BASE) MCG/ACT IN AERS: 2.0000 | INHALATION_SPRAY | Freq: Four times a day (QID) | RESPIRATORY_TRACT | 3 refills | Status: DC | PRN

## 2021-01-23 MED ORDER — SERTRALINE HCL 100 MG PO TABS: 150.0000 mg | ORAL_TABLET | Freq: Every morning | ORAL | 3 refills | Status: DC

## 2021-01-23 MED ORDER — FERROUS SULFATE 325 (65 FE) MG PO TBEC: 325.0000 mg | DELAYED_RELEASE_TABLET | ORAL | 3 refills | Status: DC

## 2021-01-23 MED ORDER — MAGNESIUM 400 MG PO TABS: 400.0000 mg | ORAL_TABLET | Freq: Every day | ORAL | 3 refills | Status: DC

## 2021-01-23 MED ORDER — VITAMIN D 50 MCG (2000 UT) PO TABS: 2000.0000 [IU] | ORAL_TABLET | Freq: Two times a day (BID) | ORAL | 3 refills | Status: DC

## 2021-01-23 MED ORDER — BUDESONIDE 32 MCG/ACT NA SUSP: 2.0000 | Freq: Every day | NASAL | 0 refills | Status: DC

## 2021-01-23 MED ORDER — CETIRIZINE HCL 10 MG PO TABS: 10.0000 mg | ORAL_TABLET | Freq: Two times a day (BID) | ORAL | 3 refills | Status: DC

## 2021-01-23 MED ORDER — HYDROCHLOROTHIAZIDE 25 MG PO TABS: 25.0000 mg | ORAL_TABLET | Freq: Every morning | ORAL | 3 refills | Status: DC

## 2021-01-23 NOTE — Assessment & Plan Note (Signed)
Tinnitus in the setting of clear effusion.  Patient did undergo recent intubation.  No ototoxic medications uncovered from patient report.  Likely related to inflammation and effusion present. She does have some mild congestive symptoms.  Will treat today with rhinocort daily for 2 weeks. If no improvement, patient will contact the office.

## 2021-01-23 NOTE — Assessment & Plan Note (Signed)
Hx of asthma with increased ShOB over the past 3 months.  Most recent Hgb value dropped to 8.3, which is likely the culprit.  Lungs clear today with no signs of respiratory distress or decreased oxygenation.No signs of asthma exacerbation or infection.  Will monitor Hgb, Hct, Fe, TIBC, and Ferritin today for full evaluation.  Recommend start 325mg  FeSo4 every other day. Will make changes if needed based on lab results.  Will follow-up in 3 months for repeat CBC and Fe, TIBC, Ferritin.

## 2021-01-23 NOTE — Patient Instructions (Signed)
Thank you for choosing [place] at [facility] for your Primary Care needs. I am excited for the opportunity to partner with you to meet your health care goals. It was a pleasure meeting you today!  Recommendations from today's visit: I have sent the rhinocort to the pharmacy for the fluid behind your eardrum and ringing in the ear. This should help reduce the inflammation in the eustachian tube. If no change after 2 weeks, please let me know.  I have sent the orders for CBC and Ferritin, Iron, and Total Iron Binding Capacity to see how the iron levels are looking.  I sent in iron (ferrous sulfate) at $RemoveBef'325mg'brVkCLcBKC$  to take every other day for iron deficiency. You can drink this with orange juice fortified in calcium to help with absorption and calcium levels.  I will review your previous records and let you know if I have any additional concerns or recommendations.   Information on diet, exercise, and health maintenance recommendations are listed below. This is information to help you be sure you are on track for optimal health and monitoring.   Please look over this and let us know if you have any questions or if you have completed any of the health maintenance outside of [place] so that we can be sure your records are up to date.  ___________________________________________________________ About Me: I am an Adult-Geriatric Nurse Practitioner with a background in caring for patients for more than 20 years with a strong intensive care background. I provide primary care and sports medicine services to patients age 76 and older within this office. My education had a strong focus on caring for the older adult population, which I am passionate about. I am also the director of the APP Fellowship with [facility].   My desire is to provide you with the best service through preventive medicine and supportive care. I consider you a part of the medical team and value your input. I work  diligently to ensure that you are heard and your needs are met in a safe and effective manner. I want you to feel comfortable with me as your provider and want you to know that your health concerns are important to me.  For your information, our office hours are: Monday, Tuesday, and Thursday 8:00 AM - 5:00 PM Wednesday and Friday 8:00 AM - 12:00 PM.   In my time away from the office I am teaching new APP's within the system and am unavailable, but my partner, Dr. Burnard Bunting is in the office for emergent needs.   If you have questions or concerns, please call our office at [phone] or send [place] a MyChart message and we will respond as quickly as possible.  ____________________________________________________________ MyChart:  For all urgent or time sensitive needs we ask that you please call the office to avoid delays. Our number is [phone]. MyChart is not constantly monitored and due to the large volume of messages a day, replies may take up to 72 business hours.  MyChart Policy: MyChart allows for you to see your visit notes, after visit summary, provider recommendations, lab and tests results, make an appointment, request refills, and contact your provider or the office for non-urgent questions or concerns. Providers are seeing patients during normal business hours and do not have built in time to review MyChart messages.  We ask that you allow a minimum of 3 business days for responses to [employer]. For this reason, please do not send urgent requests through  MyChart. Please call the office at [phone]. New and ongoing conditions may require a visit. We have virtual and in person visit available for your convenience.  Complex MyChart concerns may require a visit. Your provider may request you schedule a virtual or in person visit to ensure we are providing the best care possible. MyChart messages sent after 11:00 AM on Friday will not be received by the provider until Monday  morning.    Lab and Test Results: You will receive your lab and test results on MyChart as soon as they are completed and results have been sent by the lab or testing facility. Due to this service, you will receive your results BEFORE your provider.  I review lab and tests results each morning prior to seeing patients. Some results require collaboration with other providers to ensure you are receiving the most appropriate care. For this reason, we ask that you please allow a minimum of 3-5 business days from the time the ALL results have been received for your provider to receive and review lab and test results and contact you about these.  Most lab and test result comments from the provider will be sent through [place]. Your provider may recommend changes to the plan of care, follow-up visits, repeat testing, ask questions, or request an office visit to discuss these results. You may reply directly to this message or call the office at [phone] to provide information for the provider or set up an appointment. In some instances, you will be called with test results and recommendations. Please let us know if this is preferred and we will make note of this in your chart to provide this for you.    If you have not heard a response to your lab or test results in 5 business days from all results returning to [place], please call the office to let us know. We ask that you please avoid calling prior to this time unless there is an emergent concern. Due to high call volumes, this can delay the resulting process.  After Hours: For all non-emergency after hours needs, please call the office at [phone] and select the option to reach the on-call provider service. On-call services are shared between multiple [place] offices and therefore it will not be possible to speak directly with your provider. On-call providers may provide medical advice and recommendations, but are unable to provide refills for  maintenance medications.  For all emergency or urgent medical needs after normal business hours, we recommend that you seek care at the closest Urgent Care or Emergency Department to ensure appropriate treatment in a timely manner.  [facility] [place] has a 24 hour emergency room located on the ground floor for your convenience.   Urgent Concerns During the Business Day Providers are seeing patients from 8AM to [place] with a busy schedule and are most often not able to respond to non-urgent calls until the end of the day or the next business day. If you should have URGENT concerns during the day, please call and speak to the nurse or schedule a same day appointment so that we can address your concern without delay.   Thank you, again, for choosing me as your health care partner. I appreciate your trust and look forward to learning more about you.   Christine Keeler, DNP, AGNP-c ___________________________________________________________  Health Maintenance Recommendations Screening Testing Mammogram Every 1 -2 years based on history and risk factors Starting at age 74 Pap Smear Ages 21-39 every 3  years Ages 7-65 every 5 years with HPV testing More frequent testing may be required based on results and history Colon Cancer Screening Every 1-10 years based on test performed, risk factors, and history Starting at age 61 Bone Density Screening Every 2-10 years based on history Starting at age 35 for women Recommendations for men differ based on medication usage, history, and risk factors AAA Screening One time ultrasound Men 74-40 years old who have every smoked Lung Cancer Screening Low Dose Lung CT every 12 months Age 77-80 years with a 30 pack-year smoking history who still smoke or who have quit within the last 15 years  Screening Labs Routine  Labs: Complete Blood Count (CBC), Complete Metabolic Panel (CMP), Cholesterol (Lipid Panel) Every 6-12 months based on  history and medications May be recommended more frequently based on current conditions or previous results Hemoglobin A1c Lab Every 3-12 months based on history and previous results Starting at age 30 or earlier with diagnosis of diabetes, high cholesterol, BMI >26, and/or risk factors Frequent monitoring for patients with diabetes to ensure blood sugar control Thyroid Panel (TSH w/ T3 & T4) Every 6 months based on history, symptoms, and risk factors May be repeated more often if on medication HIV One time testing for all patients 48 and older May be repeated more frequently for patients with increased risk factors or exposure Hepatitis C One time testing for all patients 57 and older May be repeated more frequently for patients with increased risk factors or exposure Gonorrhea, Chlamydia Every 12 months for all sexually active persons 13-24 years Additional monitoring may be recommended for those who are considered high risk or who have symptoms PSA Men 62-66 years old with risk factors Additional screening may be recommended from age 51-69 based on risk factors, symptoms, and history  Vaccine Recommendations Tetanus Booster All adults every 10 years Flu Vaccine All patients 6 months and older every year COVID Vaccine All patients 12 years and older Initial dosing with booster May recommend additional booster based on age and health history HPV Vaccine 2 doses all patients age 62-26 Dosing may be considered for patients over 26 Shingles Vaccine (Shingrix) 2 doses all adults 4 years and older Pneumonia (Pneumovax 23) All adults 48 years and older May recommend earlier dosing based on health history Pneumonia (Prevnar 11) All adults 43 years and older Dosed 1 year after Pneumovax 23  Additional Screening, Testing, and Vaccinations may be recommended on an individualized basis based on family history, health history, risk factors, and/or exposure.   __________________________________________________________  Diet Recommendations for All Patients  I recommend that all patients maintain a diet low in saturated fats, carbohydrates, and cholesterol. While this can be challenging at first, it is not impossible and small changes can make big differences.  Things to try: Decreasing the amount of soda, sweet tea, and/or juice to one or less per day and replace with water While water is always the first choice, if you do not like water you may consider adding a water additive without sugar to improve the taste other sugar free drinks Replace potatoes with a brightly colored vegetable at dinner Use healthy oils, such as canola oil or olive oil, instead of butter or hard margarine Limit your bread intake to two pieces or less a day Replace regular pasta with low carb pasta options Bake, broil, or grill foods instead of frying Monitor portion sizes  Eat smaller, more frequent meals throughout the day instead of large meals  An  important thing to remember is, if you love foods that are not great for your health, you don't have to give them up completely. Instead, allow these foods to be a reward when you have done well. Allowing yourself to still have special treats every once in a while is a nice way to tell yourself thank you for working hard to keep yourself healthy.   Also remember that every day is a new day. If you have a bad day and "fall off the wagon", you can still climb right back up and keep moving along on your journey!  We have resources available to help you!  Some websites that may be helpful include: www.http://carter.biz/  Www.VeryWellFit.com _____________________________________________________________  Activity Recommendations for All Patients  I recommend that all adults get at least 20 minutes of moderate physical activity that elevates your heart rate at least 5 days out of the week.  Some examples include: Walking or  jogging at a pace that allows you to carry on a conversation Cycling (stationary bike or outdoors) Water aerobics Yoga Weight lifting Dancing If physical limitations prevent you from putting stress on your joints, exercise in a pool or seated in a chair are excellent options.  Do determine your MAXIMUM heart rate for activity: YOUR AGE - 220 = MAX HeartRate   Remember! Do not push yourself too hard.  Start slowly and build up your pace, speed, weight, time in exercise, etc.  Allow your body to rest between exercise and get good sleep. You will need more water than normal when you are exerting yourself. Do not wait until you are thirsty to drink. Drink with a purpose of getting in at least 8, 8 ounce glasses of water a day plus more depending on how much you exercise and sweat.    If you begin to develop dizziness, chest pain, abdominal pain, jaw pain, shortness of breath, headache, vision changes, lightheadedness, or other concerning symptoms, stop the activity and allow your body to rest. If your symptoms are severe, seek emergency evaluation immediately. If your symptoms are concerning, but not severe, please let us know so that we can recommend further evaluation.

## 2021-01-23 NOTE — Addendum Note (Signed)
Addended by: Campbell Riches on: 01/23/2021 03:03 PM   Modules accepted: Orders

## 2021-01-23 NOTE — Assessment & Plan Note (Signed)
Fatigue with shortness of breath suspected r/t IDA. Most recent Hgb 8.3g/dL in hospital s/p total knee arthroplasty. Prior labs reviewed to show Hgb at 10. No replacement or iron supplementation.  Will check labs today to ensure that levels have increased.  Start 325mg  FeSo4 every other day starting today. Will make changes as needed based on lab results.  Will plan to recheck in 3 months.  No signs of bleeding present.

## 2021-01-23 NOTE — Assessment & Plan Note (Signed)
Review of current and past medical history, social history, medication, and family history.  Review of care gaps and health maintenance recommendations.  Records from recent providers to be requested if not available in Chart Review or Care Everywhere.  Recommendations for health maintenance, diet, and exercise provided.  Labs today: CBC, Fe, TIBC, Ferritin HM Recommendations: Cologuard- will review medical history for further recommendations CPE due: September 2023

## 2021-01-23 NOTE — Progress Notes (Signed)
Orma Render, DNP, AGNP-c [facility] [address]  [place], [place] [address] [phone]  New patient visit   Patient: [name]   DOB: 1955-10-21   65 y.o. Female  MRN: [id number] Visit Date: 01/23/2021  Patient Care Team: Ryun Velez, Coralee Pesa, NP as PCP - General (Nurse Practitioner)  Today's healthcare provider: Orma Render, NP   Chief Complaint  Patient presents with   Establish Care    Patient is here to establish care. Concerns that she has today is SOB x 3 months.Ringing in right ear which is causing balance issues.As well as abnormal lab work.  She does need all medication refills today. She has already had her flu shot and Covid shots x4.   Subjective    [name] is a 65 y.o. female who presents today as a new patient to establish care.  HPI  [name] has concerns with shortness of breath and fatigue for the last few months. She has also noticed about an 8 pound weight loss and decreased appetite for the past month. She is also concerned about ringing in her ear. She underwent a total knee replacement on September 27 of this year at [facility] and had lab work completed at that time. Shortly before going in for surgery her PCP performed lab work as well. She has concerns over some of the results and would like to discuss these today.   ShOB/Fatigue She endorses: Increased shortness of breath and fatigue for about the past 3 months.  Decreased stamina No illness that she is aware of  Weight Loss Reports since surgery she has had a significantly decreased appetite No known reason Has not been as active due to surgery and fatigue  No other changes that she is aware of Had her thyroid levels checked in September, all labs WNL with exception of Hgb/Hct  Tinnitus Endorses onset shortly after surgery Only on the right side Has been a little congested in the head Has felt a little off balance with the  ringing High pitched   She also tells me that she is having some increased irritability and anxiety since her surgery. She is currently on zoloft 150mg  for this. She reports her symptoms were well controlled prior to surgery, but have been slightly increased over the last few weeks.    Past Medical History:  Diagnosis Date   Arthritis    Asthma    Depression    Goiter    Hypertension    Past Surgical History:  Procedure Laterality Date   CHOLECYSTECTOMY     FOOT SURGERY     KNEE SURGERY Left    TKA x2    1974, 2014   SHOULDER SURGERY     TOTAL HIP ARTHROPLASTY Left    TOTAL KNEE ARTHROPLASTY Right 11/28/2020   Procedure: TOTAL KNEE ARTHROPLASTY;  Surgeon: Paralee Cancel, MD;  Location: [facility];  Service: Orthopedics;  Laterality: Right;   Family Status  Relation Name Status   MGM  (Not Specified)   Family History  Problem Relation Age of Onset   Breast cancer Maternal Grandmother    Social History   Socioeconomic History   Marital status: Married    Spouse name: Not on file   Number of children: Not on file   Years of education: Not on file   Highest education level: Not on file  Occupational History   Not on file  Tobacco Use   Smoking  status: Former    Packs/day: 1.00    Years: 4.00    Pack years: 4.00    Types: Cigarettes    Quit date: 1974    Years since quitting: 48.9   Smokeless tobacco: Never  Substance and Sexual Activity   Alcohol use: Yes    Comment: Wine occasional   Drug use: No   Sexual activity: Not on file  Other Topics Concern   Not on file  Social History Narrative   Not on file   Social Determinants of Health   Financial Resource Strain: Not on file  Food Insecurity: Not on file  Transportation Needs: Not on file  Physical Activity: Not on file  Stress: Not on file  Social Connections: Not on file   Outpatient Medications Prior to Visit  Medication Sig   ADVAIR DISKUS 250-50 MCG/DOSE AEPB Inhale 2 puffs into the lungs 2 (two)  times daily.   albuterol (VENTOLIN HFA) 108 (90 Base) MCG/ACT inhaler Inhale 2 puffs into the lungs every 6 (six) hours as needed for wheezing or shortness of breath.   Ascorbic Acid (VITAMIN C) 1000 MG tablet Take 1,000 mg by mouth daily.   calcium carbonate (TUMS - DOSED IN MG ELEMENTAL CALCIUM) 500 MG chewable tablet Chew 1,000 mg by mouth daily as needed for indigestion or heartburn.   celecoxib (CELEBREX) 200 MG capsule celecoxib 200 mg capsule  Take 1 capsule twice a day by oral route with meals for 30 days.   cetirizine (ZYRTEC) 10 MG tablet Take 10 mg by mouth 2 (two) times daily.   Cholecalciferol (VITAMIN D) 50 MCG (2000 UT) tablet Take 2,000 Units by mouth 2 (two) times daily.   COVID-19 mRNA bivalent vaccine, Pfizer, (PFIZER COVID-19 VAC BIVALENT) injection Inject into the muscle.   ESTRING 2 MG vaginal ring Place 2 mg vaginally every 3 (three) months.    hydrochlorothiazide (HYDRODIURIL) 25 MG tablet Take 25 mg by mouth every morning.    influenza vaccine adjuvanted (FLUAD) 0.5 ML injection Inject into the muscle.   Magnesium 400 MG TABS Take 400 mg by mouth daily.   sertraline (ZOLOFT) 100 MG tablet Take 150 mg by mouth every morning.   [DISCONTINUED] celecoxib (CELEBREX) 200 MG capsule Take 1 capsule (200 mg total) by mouth 2 (two) times daily.   [DISCONTINUED] docusate sodium (COLACE) 100 MG capsule Take 1 capsule (100 mg total) by mouth 2 (two) times daily.   [DISCONTINUED] HYDROmorphone (DILAUDID) 2 MG tablet Take 1-2 tablets (2-4 mg total) by mouth every 4 (four) hours as needed for severe pain.   [DISCONTINUED] methocarbamol (ROBAXIN) 500 MG tablet Take 1 tablet (500 mg total) by mouth every 6 (six) hours as needed for muscle spasms.   [DISCONTINUED] polyethylene glycol (MIRALAX / GLYCOLAX) 17 g packet Take 17 g by mouth daily as needed for mild constipation.   No facility-administered medications prior to visit.   Allergies  Allergen Reactions   Mango Flavor [Flavoring  Agent] Anaphylaxis    Specifically mango peel , patient can eat the fruit itself   Oxycontin [Oxycodone]     Dizziness    Immunization History  Administered Date(s) Administered   Influenza-Unspecified 12/18/2020   Pfizer Covid-19 Vaccine Bivalent Booster 24yrs & up 12/18/2020    Health Maintenance  Topic Date Due   Hepatitis C Screening  Never done   TETANUS/TDAP  Never done   COLONOSCOPY (Pts 45-19yrs Insurance coverage will need to be confirmed)  Never done   Zoster Vaccines- Shingrix (1  of 2) Never done   Pneumonia Vaccine 47+ Years old (1 - PCV) Never done   COVID-19 Vaccine (2 - Pfizer series) 01/08/2021   MAMMOGRAM  08/30/2021   INFLUENZA VACCINE  Completed   DEXA SCAN  Completed   HPV VACCINES  Aged Out    Patient Care Team: Kalanie Fewell, Coralee Pesa, NP as PCP - General (Nurse Practitioner)  Review of Systems All review of systems negative except what is listed in the HPI    Objective    BP 122/80   Pulse 82   Ht 5\' 4"  (1.626 m)   Wt 200 lb (90.7 kg)   LMP 08/08/2010   SpO2 97%   BMI 34.33 kg/m  Physical Exam Vitals and nursing note reviewed.  Constitutional:      Appearance: Normal appearance. She is not ill-appearing.  HENT:     Head: Normocephalic.     Right Ear: Ear canal and external ear normal. A middle ear effusion is present. Tympanic membrane is bulging.     Left Ear: Tympanic membrane, ear canal and external ear normal.     Ears:     Comments: Clear effusion present in right ear with no signs of drainage or erythema.     Nose: Congestion present.  Eyes:     Extraocular Movements: Extraocular movements intact.     Conjunctiva/sclera: Conjunctivae normal.     Pupils: Pupils are equal, round, and reactive to light.  Neck:     Vascular: No carotid bruit.  Cardiovascular:     Rate and Rhythm: Normal rate and regular rhythm.     Pulses: Normal pulses.     Heart sounds: Normal heart sounds. No murmur heard. Pulmonary:     Effort: Pulmonary effort is  normal. No respiratory distress.     Breath sounds: Normal breath sounds. No wheezing or rhonchi.  Chest:     Chest wall: No tenderness.  Abdominal:     General: Abdomen is flat. Bowel sounds are normal. There is no distension.     Palpations: Abdomen is soft.     Tenderness: There is no abdominal tenderness. There is no right CVA tenderness, left CVA tenderness, guarding or rebound.  Musculoskeletal:     Cervical back: Normal range of motion. No tenderness.     Right lower leg: No edema.     Left lower leg: No edema.  Lymphadenopathy:     Cervical: No cervical adenopathy.  Skin:    General: Skin is warm and dry.     Capillary Refill: Capillary refill takes less than 2 seconds.  Neurological:     General: No focal deficit present.     Mental Status: She is alert and oriented to person, place, and time.     Motor: No weakness.  Psychiatric:        Mood and Affect: Mood normal.        Behavior: Behavior normal.        Thought Content: Thought content normal.        Judgment: Judgment normal.     Depression Screen PHQ 2/9 Scores 01/23/2021  PHQ - 2 Score 0  PHQ- 9 Score 3   No results found for any visits on 01/23/21.  Assessment & Plan      Problem List Items Addressed This Visit     Iron deficiency anemia secondary to inadequate dietary iron intake - Primary   Relevant Medications   ferrous sulfate 325 (65 FE) MG EC tablet   Other  Relevant Orders   CBC with Differential/Platelet   Fe+TIBC+Fer   Other fatigue    Fatigue with shortness of breath suspected r/t IDA. Most recent Hgb 8.3g/dL in hospital s/p total knee arthroplasty. Prior labs reviewed to show Hgb at 10. No replacement or iron supplementation.  Will check labs today to ensure that levels have increased.  Start 325mg  FeSo4 every other day starting today. Will make changes as needed based on lab results.  Will plan to recheck in 3 months.  No signs of bleeding present.        Shortness of breath    Hx  of asthma with increased ShOB over the past 3 months.  Most recent Hgb value dropped to 8.3, which is likely the culprit.  Lungs clear today with no signs of respiratory distress or decreased oxygenation.No signs of asthma exacerbation or infection.  Will monitor Hgb, Hct, Fe, TIBC, and Ferritin today for full evaluation.  Recommend start 325mg  FeSo4 every other day. Will make changes if needed based on lab results.  Will follow-up in 3 months for repeat CBC and Fe, TIBC, Ferritin.       Colon cancer screening   Relevant Orders   Cologuard   Acute effusion of right ear    Clear effusion present in the right ear likely contributing to tinnitus symptoms.  No signs of infection present.  Recommend rhinocort to help reduce inflammation.  If symptoms persist after 2 weeks of continuous use, patient will notify the office.       Relevant Medications   budesonide (RHINOCORT AQUA) 32 MCG/ACT nasal spray   Tinnitus of right ear    Tinnitus in the setting of clear effusion.  Patient did undergo recent intubation.  No ototoxic medications uncovered from patient report.  Likely related to inflammation and effusion present. She does have some mild congestive symptoms.  Will treat today with rhinocort daily for 2 weeks. If no improvement, patient will contact the office.       Relevant Medications   budesonide (RHINOCORT AQUA) 32 MCG/ACT nasal spray   Encounter for medical examination to establish care    Review of current and past medical history, social history, medication, and family history.  Review of care gaps and health maintenance recommendations.  Records from recent providers to be requested if not available in Chart Review or Care Everywhere.  Recommendations for health maintenance, diet, and exercise provided.  Labs today: CBC, Fe, TIBC, Ferritin HM Recommendations: Cologuard- will review medical history for further recommendations CPE due: September 2023         Return in  about 3 months (around 04/25/2021) for Recheck iron- possible sooner based on labs.      Daeton Kluth, Coralee Pesa, NP, DNP, AGNP-C [facility] at [place]

## 2021-01-23 NOTE — Assessment & Plan Note (Addendum)
Clear effusion present in the right ear likely contributing to tinnitus symptoms.  No signs of infection present.  Recommend rhinocort to help reduce inflammation.  If symptoms persist after 2 weeks of continuous use, patient will notify the office.

## 2021-01-24 ENCOUNTER — Ambulatory Visit (HOSPITAL_BASED_OUTPATIENT_CLINIC_OR_DEPARTMENT_OTHER): Payer: Self-pay | Admitting: Physical Therapy

## 2021-01-24 NOTE — Telephone Encounter (Signed)
Refills sent to pharmacy Called patient to confirm medications she needed and pharmacy on file

## 2021-01-29 ENCOUNTER — Other Ambulatory Visit: Payer: Self-pay | Admitting: Nurse Practitioner

## 2021-01-29 ENCOUNTER — Other Ambulatory Visit: Payer: Self-pay

## 2021-01-29 ENCOUNTER — Ambulatory Visit
Admission: RE | Admit: 2021-01-29 | Discharge: 2021-01-29 | Disposition: A | Discharge status: Home / Self Care | Payer: Medicare PPO | Source: Ambulatory Visit | Attending: Nurse Practitioner | Admitting: Nurse Practitioner

## 2021-01-29 ENCOUNTER — Other Ambulatory Visit: Payer: Self-pay | Admitting: *Deleted

## 2021-01-29 ENCOUNTER — Encounter (HOSPITAL_BASED_OUTPATIENT_CLINIC_OR_DEPARTMENT_OTHER): Payer: Self-pay | Admitting: Physical Therapy

## 2021-01-29 DIAGNOSIS — Z1231 Encounter for screening mammogram for malignant neoplasm of breast: Secondary | ICD-10-CM

## 2021-02-01 ENCOUNTER — Encounter (HOSPITAL_BASED_OUTPATIENT_CLINIC_OR_DEPARTMENT_OTHER): Payer: Self-pay | Admitting: Nurse Practitioner

## 2021-02-05 ENCOUNTER — Other Ambulatory Visit: Payer: Self-pay

## 2021-02-05 ENCOUNTER — Other Ambulatory Visit (HOSPITAL_BASED_OUTPATIENT_CLINIC_OR_DEPARTMENT_OTHER): Payer: Self-pay | Admitting: Nurse Practitioner

## 2021-02-05 ENCOUNTER — Ambulatory Visit (HOSPITAL_BASED_OUTPATIENT_CLINIC_OR_DEPARTMENT_OTHER): Payer: Medicare PPO

## 2021-02-05 DIAGNOSIS — Z1211 Encounter for screening for malignant neoplasm of colon: Secondary | ICD-10-CM | POA: Diagnosis not present

## 2021-02-05 DIAGNOSIS — D508 Other iron deficiency anemias: Secondary | ICD-10-CM | POA: Diagnosis not present

## 2021-02-06 ENCOUNTER — Encounter (HOSPITAL_BASED_OUTPATIENT_CLINIC_OR_DEPARTMENT_OTHER): Payer: Self-pay | Admitting: Nurse Practitioner

## 2021-02-06 LAB — IRON,TIBC AND FERRITIN PANEL
Ferritin: 29 ng/mL (ref 15–150)
Iron Saturation: 6 % — CL (ref 15–55)
Iron: 30 ug/dL (ref 27–139)
Total Iron Binding Capacity: 479 ug/dL — ABNORMAL HIGH (ref 250–450)
UIBC: 449 ug/dL — ABNORMAL HIGH (ref 118–369)

## 2021-02-07 ENCOUNTER — Other Ambulatory Visit (HOSPITAL_BASED_OUTPATIENT_CLINIC_OR_DEPARTMENT_OTHER): Payer: Self-pay | Admitting: Nurse Practitioner

## 2021-02-07 DIAGNOSIS — R0602 Shortness of breath: Secondary | ICD-10-CM

## 2021-02-07 DIAGNOSIS — H9311 Tinnitus, right ear: Secondary | ICD-10-CM

## 2021-02-07 DIAGNOSIS — R5383 Other fatigue: Secondary | ICD-10-CM

## 2021-02-07 DIAGNOSIS — D508 Other iron deficiency anemias: Secondary | ICD-10-CM

## 2021-02-08 ENCOUNTER — Other Ambulatory Visit: Payer: Self-pay | Admitting: Nurse Practitioner

## 2021-02-08 DIAGNOSIS — D649 Anemia, unspecified: Secondary | ICD-10-CM

## 2021-02-09 LAB — COLOGUARD: COLOGUARD: NEGATIVE

## 2021-02-09 NOTE — Progress Notes (Signed)
Cologuard results are normal. We will plan to repeat in 3 years unless there are any issues.   Have a great day! SaraBeth

## 2021-02-12 ENCOUNTER — Inpatient Hospital Stay: Payer: Medicare PPO

## 2021-02-12 ENCOUNTER — Encounter: Payer: Self-pay | Admitting: Nurse Practitioner

## 2021-02-12 ENCOUNTER — Other Ambulatory Visit: Payer: Self-pay

## 2021-02-12 ENCOUNTER — Inpatient Hospital Stay: Payer: Medicare PPO | Attending: Nurse Practitioner | Admitting: Nurse Practitioner

## 2021-02-12 VITALS — BP 137/71 | HR 73 | Temp 97.8°F | Resp 18 | Ht 64.0 in | Wt 201.6 lb

## 2021-02-12 DIAGNOSIS — R634 Abnormal weight loss: Secondary | ICD-10-CM | POA: Diagnosis not present

## 2021-02-12 DIAGNOSIS — I10 Essential (primary) hypertension: Secondary | ICD-10-CM | POA: Diagnosis not present

## 2021-02-12 DIAGNOSIS — R0609 Other forms of dyspnea: Secondary | ICD-10-CM | POA: Diagnosis not present

## 2021-02-12 DIAGNOSIS — J45909 Unspecified asthma, uncomplicated: Secondary | ICD-10-CM

## 2021-02-12 DIAGNOSIS — Z96651 Presence of right artificial knee joint: Secondary | ICD-10-CM

## 2021-02-12 DIAGNOSIS — D649 Anemia, unspecified: Secondary | ICD-10-CM | POA: Diagnosis not present

## 2021-02-12 DIAGNOSIS — R630 Anorexia: Secondary | ICD-10-CM

## 2021-02-12 DIAGNOSIS — D509 Iron deficiency anemia, unspecified: Secondary | ICD-10-CM | POA: Diagnosis not present

## 2021-02-12 DIAGNOSIS — R109 Unspecified abdominal pain: Secondary | ICD-10-CM | POA: Diagnosis not present

## 2021-02-12 DIAGNOSIS — R110 Nausea: Secondary | ICD-10-CM | POA: Insufficient documentation

## 2021-02-12 LAB — URINALYSIS, COMPLETE (UACMP) WITH MICROSCOPIC
Bilirubin Urine: NEGATIVE
Glucose, UA: NEGATIVE mg/dL
Hgb urine dipstick: NEGATIVE
Ketones, ur: NEGATIVE mg/dL
Nitrite: NEGATIVE
Protein, ur: NEGATIVE mg/dL
Specific Gravity, Urine: 1.024 (ref 1.005–1.030)
pH: 6.5 (ref 5.0–8.0)

## 2021-02-12 LAB — SAVE SMEAR(SSMR), FOR PROVIDER SLIDE REVIEW

## 2021-02-12 LAB — CBC WITH DIFFERENTIAL (CANCER CENTER ONLY)
Abs Immature Granulocytes: 0.02 10*3/uL (ref 0.00–0.07)
Basophils Absolute: 0.1 10*3/uL (ref 0.0–0.1)
Basophils Relative: 1 %
Eosinophils Absolute: 0.2 10*3/uL (ref 0.0–0.5)
Eosinophils Relative: 3 %
HCT: 36.6 % (ref 36.0–46.0)
Hemoglobin: 10.7 g/dL — ABNORMAL LOW (ref 12.0–15.0)
Immature Granulocytes: 0 %
Lymphocytes Relative: 11 %
Lymphs Abs: 0.8 10*3/uL (ref 0.7–4.0)
MCH: 23.5 pg — ABNORMAL LOW (ref 26.0–34.0)
MCHC: 29.2 g/dL — ABNORMAL LOW (ref 30.0–36.0)
MCV: 80.3 fL (ref 80.0–100.0)
Monocytes Absolute: 0.8 10*3/uL (ref 0.1–1.0)
Monocytes Relative: 10 %
Neutro Abs: 5.7 10*3/uL (ref 1.7–7.7)
Neutrophils Relative %: 75 %
Platelet Count: 272 10*3/uL (ref 150–400)
RBC: 4.56 MIL/uL (ref 3.87–5.11)
RDW: 19 % — ABNORMAL HIGH (ref 11.5–15.5)
WBC Count: 7.6 10*3/uL (ref 4.0–10.5)
nRBC: 0 % (ref 0.0–0.2)

## 2021-02-12 NOTE — Progress Notes (Signed)
New Hematology/Oncology Consult   Requesting MD: Jacolyn Reedy, NP  [phone]      Reason for Consult: Anemia  HPI: [name] has been referred for evaluation of anemia.  She was seen as a new patient by [name], NP on 01/23/2021.  CBC same day returned with a hemoglobin of 9.2, MCV 77, white count 6.2 and platelet count 313,000.  Iron studies completed 02/05/2021 as follows: TIBC elevated at 479 (250-450), UIBC elevated at 449 (118-369), iron low normal range at 30 (27-139), percent saturation low at 6% (15-55), ferritin 29 (15-150).  Cologuard from 02/05/2021 negative.  Oral iron was increased to twice daily beginning 02/06/2021.  She is tolerating the iron without difficulty.  She underwent a right total knee replacement 11/28/2020.  Preoperative CBC: Hemoglobin 10.8, MCV 80, white count 9.4, platelet count 223,000.  She is not aware of any bleeding.  Specifically no black or maroon stools, no hematuria, no vaginal bleeding.  Overall she eats a normal diet.  She does not eat red meat due to a previous tick bite.  No tongue soreness.  No nail changes.  She craves ice.  She has had shortness of breath for the past 6 months, exertional related.  No fever or sweats.  No cough.  Appetite has been poor for the past approximate 2 months.  She estimates 15 pounds of unintentional weight loss over the past year.  She has had no change in bowel habits.  She has occasional nausea and upper abdominal discomfort.    Past Medical History:  Diagnosis Date   Arthritis    Asthma    Depression    Goiter    Hypertension      Past Surgical History:  Procedure Laterality Date   CHOLECYSTECTOMY     FOOT SURGERY     KNEE SURGERY Left    TKA x2    1974, 2014   SHOULDER SURGERY     TOTAL HIP ARTHROPLASTY Left    TOTAL KNEE ARTHROPLASTY Right 11/28/2020   Procedure: TOTAL KNEE ARTHROPLASTY;  Surgeon: Paralee Cancel, MD;  Location: [facility];  Service: Orthopedics;  Laterality: Right;  :   Current  Outpatient Medications:    ADVAIR DISKUS 250-50 MCG/DOSE AEPB, Inhale 2 puffs into the lungs 2 (two) times daily., Disp: , Rfl:    albuterol (VENTOLIN HFA) 108 (90 Base) MCG/ACT inhaler, Inhale 2 puffs into the lungs every 6 (six) hours as needed for wheezing or shortness of breath., Disp: 1 each, Rfl: 3   Ascorbic Acid (VITAMIN C) 1000 MG tablet, Take 1,000 mg by mouth daily., Disp: , Rfl:    calcium carbonate (TUMS - DOSED IN MG ELEMENTAL CALCIUM) 500 MG chewable tablet, Chew 1,000 mg by mouth daily as needed for indigestion or heartburn., Disp: , Rfl:    celecoxib (CELEBREX) 200 MG capsule, celecoxib 200 mg capsule  Take 1 capsule twice a day by oral route with meals for 30 days., Disp: , Rfl:    cetirizine (ZYRTEC) 10 MG tablet, Take 1 tablet (10 mg total) by mouth 2 (two) times daily., Disp: 60 tablet, Rfl: 3   Cholecalciferol (VITAMIN D) 50 MCG (2000 UT) tablet, Take 1 tablet (2,000 Units total) by mouth 2 (two) times daily., Disp: 60 tablet, Rfl: 3   ESTRING 2 MG vaginal ring, Place 2 mg vaginally every 3 (three) months. , Disp: , Rfl:    ferrous sulfate 325 (65 FE) MG EC tablet, Take 1 tablet (325 mg total) by mouth every other  day. Take with food to avoid stomach upset., Disp: 90 tablet, Rfl: 3   hydrochlorothiazide (HYDRODIURIL) 25 MG tablet, Take 1 tablet (25 mg total) by mouth every morning., Disp: 30 tablet, Rfl: 3   Magnesium 400 MG TABS, Take 400 mg by mouth daily., Disp: 30 tablet, Rfl: 3   sertraline (ZOLOFT) 100 MG tablet, Take 1.5 tablets (150 mg total) by mouth every morning., Disp: 45 tablet, Rfl: 3   budesonide (RHINOCORT AQUA) 32 MCG/ACT nasal spray, Place 2 sprays into both nostrils daily for 14 days., Disp: 5 mL, Rfl: 0:    Allergies  Allergen Reactions   Mango Flavor [Flavoring Agent] Anaphylaxis    Specifically mango peel , patient can eat the fruit itself   Oxycontin [Oxycodone]     Dizziness    FH: No family history of malignancy, anemia  SOCIAL HISTORY: She  lives in [place].  She is married.  No children.  She is retired.  She quit smoking 20 years ago at 1 pack/day x 10 years.  Occasional alcohol intake.  Review of Systems: No fevers or sweats.  Estimates weight loss of 15 pounds over the past year, unintentional.  Poor appetite for the past 2 months.  Overall normal diet but does not eat red meat due to previous tick bite.  No tongue soreness.  No nail changes.  She craves ice.  She denies bleeding.  Episode of left flank pain earlier today.  She is intermittently dizzy.  She notes "whooshing" in her ears.  She has had dyspnea on exertion for the past 6 months.  The dyspnea does not worsen when she lays flat.  No cough.  No dysphagia.  She has occasional nausea and upper abdominal discomfort.  No change in bowel habits.  No leg swelling.  No numbness or tingling in the hands or feet.  Physical Exam:  Blood pressure 137/71, pulse 73, temperature 97.8 F (36.6 C), temperature source Oral, resp. rate 18, height 5\' 4"  (1.626 m), weight 201 lb 9.6 oz (91.4 kg), last menstrual period 08/08/2010, SpO2 98 %.  HEENT: No thrush or ulcers. Lungs: Lungs clear bilaterally. Cardiac: Regular rate and rhythm. Abdomen: Abdomen soft and nontender.  No hepatosplenomegaly.  No rectal mass.  Stool light brown, negative for occult blood. Vascular: No leg edema. Lymph nodes: No palpable cervical, supraclavicular, axillary or inguinal lymph nodes. Neurologic: Alert and oriented.  Follows commands. Skin: No rash.  LABS:   Recent Labs    02/12/21 1353  WBC 7.6  HGB 10.7*  HCT 36.6  PLT 272   Peripheral blood smear-White blood cell morphology unremarkable; platelets normal in number; numerous ovalocytes, few teardrops, microcytes.  RADIOLOGY:  MM 3D SCREEN BREAST BILATERAL  Result Date: 01/30/2021 CLINICAL DATA:  Screening. EXAM: DIGITAL SCREENING BILATERAL MAMMOGRAM WITH TOMOSYNTHESIS AND CAD TECHNIQUE: Bilateral screening digital craniocaudal and  mediolateral oblique mammograms were obtained. Bilateral screening digital breast tomosynthesis was performed. The images were evaluated with computer-aided detection. COMPARISON:  Previous exam(s). ACR Breast Density Category a: The breast tissue is almost entirely fatty. FINDINGS: There are no findings suspicious for malignancy. IMPRESSION: No mammographic evidence of malignancy. A result letter of this screening mammogram will be mailed directly to the patient. RECOMMENDATION: Screening mammogram in one year. (Code:SM-B-01Y) BI-RADS CATEGORY  1: Negative. Electronically Signed   By: Ammie Ferrier M.D.   On: 01/30/2021 11:17    Assessment and Plan:   Iron deficiency anemia Dyspnea on exertion Anorexia/weight loss Intermittent nausea/upper abdominal discomfort Asthma Hypertension  [name] has been referred for evaluation of anemia.  Hematologic indices and peripheral blood smear are consistent with iron deficiency.  The hemoglobin has improved coinciding with increased dose oral iron.  She will continue the same.    Etiology of the iron deficiency is unclear.  There is no obvious source of blood loss.  She had a recent negative Cologuard.  Stool tested negative for blood in the office today.  We are making a referral to GI.  She will submit a urine to test for blood.  She has a 35-month history of dyspnea on exertion.  This is not likely related to the anemia.  She notes no improvement in the dyspnea despite the increase in her hemoglobin.  She will follow-up with her PCP.  She will return for lab and follow-up in 6 weeks.  We are available to see her sooner if needed.  Patient seen with Dr. Benay Spice.   Ned Card, NP 02/12/2021, 4:33 PM   This was a shared visit with Ned Card.  [name] was interviewed and examined.  I reviewed the peripheral blood smear.  She is referred for evaluation of anemia.  The serum iron studies and blood smear findings are consistent with a  diagnosis of iron deficiency anemia.  The hemoglobin has improved while on iron therapy for the past several weeks.  There is no apparent source for blood loss.  Her exertional dyspnea is most likely unrelated to anemia, but this is possible.  We will make referral to gastroenterology to evaluate her nausea/upper abdominal discomfort and look for a source of blood loss.  I was present for greater than 50% of today's visit.  I performed medical decision making.  Julieanne Manson, MD

## 2021-02-13 ENCOUNTER — Encounter: Payer: Self-pay | Admitting: Nurse Practitioner

## 2021-02-13 ENCOUNTER — Other Ambulatory Visit: Payer: Self-pay | Admitting: Nurse Practitioner

## 2021-02-13 ENCOUNTER — Other Ambulatory Visit (HOSPITAL_BASED_OUTPATIENT_CLINIC_OR_DEPARTMENT_OTHER): Payer: Self-pay

## 2021-02-13 ENCOUNTER — Encounter: Payer: Self-pay | Admitting: *Deleted

## 2021-02-13 DIAGNOSIS — D649 Anemia, unspecified: Secondary | ICD-10-CM

## 2021-02-13 NOTE — Progress Notes (Signed)
Faxed referral order to [facility] [phone]. IDA-r/o GI source of bleeding; Cologuard negative; Has seen [facility] in past, but does not wish to return there.

## 2021-02-14 ENCOUNTER — Telehealth: Payer: Self-pay

## 2021-02-14 ENCOUNTER — Encounter (HOSPITAL_BASED_OUTPATIENT_CLINIC_OR_DEPARTMENT_OTHER): Payer: Self-pay | Admitting: Nurse Practitioner

## 2021-02-14 LAB — URINE CULTURE: Culture: NO GROWTH

## 2021-02-14 NOTE — Telephone Encounter (Signed)
TC to the pt to let her know urine culture was negative and to follow-up with Clarise Cruz regarding white cell in her urine

## 2021-02-14 NOTE — Telephone Encounter (Signed)
Called lab to request urine culture. Specimen  have resulted

## 2021-02-14 NOTE — Telephone Encounter (Signed)
-----   Message from Owens Shark, NP sent at 02/13/2021  4:42 PM EST ----- Please call lab to request urine culture on specimen from 12/12

## 2021-02-14 NOTE — Telephone Encounter (Signed)
-----   Message from Owens Shark, NP sent at 02/14/2021  2:35 PM EST ----- Please let her know the urine culture is negative.  She should follow-up with Jacolyn Reedy NP regarding white cells in her urine.

## 2021-02-28 ENCOUNTER — Ambulatory Visit: Payer: Medicare PPO | Admitting: Internal Medicine

## 2021-02-28 ENCOUNTER — Ambulatory Visit (INDEPENDENT_AMBULATORY_CARE_PROVIDER_SITE_OTHER): Payer: Medicare PPO | Admitting: Gastroenterology

## 2021-02-28 ENCOUNTER — Encounter: Payer: Self-pay | Admitting: Gastroenterology

## 2021-02-28 VITALS — BP 141/68 | HR 64 | Ht 64.0 in | Wt 206.0 lb

## 2021-02-28 DIAGNOSIS — D509 Iron deficiency anemia, unspecified: Secondary | ICD-10-CM

## 2021-02-28 MED ORDER — SUTAB 1479-225-188 MG PO TABS: 1.0000 | ORAL_TABLET | Freq: Once | ORAL | 0 refills | Status: AC

## 2021-02-28 NOTE — Patient Instructions (Signed)
If you are age 66 or older, your body mass index should be between 23-30. Your Body mass index is 35.36 kg/m. If this is out of the aforementioned range listed, please consider follow up with your Primary Care Provider.  If you are age 52 or younger, your body mass index should be between 19-25. Your Body mass index is 35.36 kg/m. If this is out of the aformentioned range listed, please consider follow up with your Primary Care Provider.   ________________________________________________________  The [facility] providers would like to encourage you to use [facility] to communicate with providers for non-urgent requests or questions.  Due to long hold times on the telephone, sending your provider a message by [facility] may be a faster and more efficient way to get a response.  Please allow 48 business hours for a response.  Please remember that this is for non-urgent requests.  _______________________________________________________  [name] have been scheduled for an endoscopy and colonoscopy. Please follow the written instructions given to you at your visit today. Please pick up your prep supplies at the pharmacy within the next 1-3 days. If you use inhalers (even only as needed), please bring them with you on the day of your procedure.

## 2021-02-28 NOTE — Progress Notes (Signed)
HPI : [name] is a very pleasant 65 year old female with a history of arthritis who is referred to [place] by Christine Spring NP for further evaluation of iron deficiency anemia.  Patient denies ever being told she had low iron or low blood counts prior to September of this year.  A CBC was done preoperatively prior to her knee replacement in September and she was found to have a hemoglobin of 10.8 with an MCV of 81, down from 11.7 with an MCV of 91 in 2012.  Following her knee arthroplasty, her hemoglobin dropped to 8.  Subsequent iron panel was consistent with iron deficiency (ferritin 29, iron saturation 6%, TIBC 479).  She does report having symptoms of severe fatigue and decreased exercise intolerance which she said started sometime in July.  She noticed that she was not able to hold her breath for a long while swimming.  She denied any sort of GI symptoms such as abdominal pain, constipation diarrhea or overt GI blood loss.  She did report having decreased appetite and 15 pounds of unintentional weight loss over the course of the last several months. She had a colonoscopy which was normal in 2013.  She had a negative Cologuard in December of this year. She was started on oral iron, and has had improvement in her hemoglobin and her symptoms of fatigue and decreased exercise tolerance.   Past Medical History:  Diagnosis Date   Arthritis    Asthma    Depression    Goiter    Hypertension      Past Surgical History:  Procedure Laterality Date   CHOLECYSTECTOMY     FOOT SURGERY     KNEE SURGERY Left    TKA x2    1974, 2014   SHOULDER SURGERY     TOTAL HIP ARTHROPLASTY Left    TOTAL KNEE ARTHROPLASTY Right 11/28/2020   Procedure: TOTAL KNEE ARTHROPLASTY;  Surgeon: Paralee Cancel, MD;  Location: [facility];  Service: Orthopedics;  Laterality: Right;   Family History  Problem Relation Age of Onset   Breast cancer Maternal Grandmother    Social History   Tobacco Use   Smoking status: Former     Packs/day: 1.00    Years: 4.00    Pack years: 4.00    Types: Cigarettes    Quit date: 1974    Years since quitting: 49.0   Smokeless tobacco: Never  Vaping Use   Vaping Use: Never used  Substance Use Topics   Alcohol use: Yes    Comment: Wine occasional   Drug use: No   Current Outpatient Medications  Medication Sig Dispense Refill   ADVAIR DISKUS 250-50 MCG/DOSE AEPB Inhale 2 puffs into the lungs 2 (two) times daily.     albuterol (VENTOLIN HFA) 108 (90 Base) MCG/ACT inhaler Inhale 2 puffs into the lungs every 6 (six) hours as needed for wheezing or shortness of breath. 1 each 3   Ascorbic Acid (VITAMIN C) 1000 MG tablet Take 1,000 mg by mouth daily.     calcium carbonate (TUMS - DOSED IN MG ELEMENTAL CALCIUM) 500 MG chewable tablet Chew 1,000 mg by mouth daily as needed for indigestion or heartburn.     celecoxib (CELEBREX) 200 MG capsule celecoxib 200 mg capsule  Take 1 capsule twice a day by oral route with meals for 30 days.     cetirizine (ZYRTEC) 10 MG tablet Take 1 tablet (10 mg total) by mouth 2 (two) times daily. 60 tablet 3   Cholecalciferol (  VITAMIN D) 50 MCG (2000 UT) tablet Take 1 tablet (2,000 Units total) by mouth 2 (two) times daily. 60 tablet 3   ferrous sulfate 325 (65 FE) MG EC tablet Take 1 tablet (325 mg total) by mouth every other day. Take with food to avoid stomach upset. 90 tablet 3   hydrochlorothiazide (HYDRODIURIL) 25 MG tablet Take 1 tablet (25 mg total) by mouth every morning. 30 tablet 3   Magnesium 400 MG TABS Take 400 mg by mouth daily. 30 tablet 3   sertraline (ZOLOFT) 100 MG tablet Take 1.5 tablets (150 mg total) by mouth every morning. 45 tablet 3   budesonide (RHINOCORT AQUA) 32 MCG/ACT nasal spray Place 2 sprays into both nostrils daily for 14 days. 5 mL 0   No current facility-administered medications for this visit.   Allergies  Allergen Reactions   Mango Flavor [Flavoring Agent] Anaphylaxis    Specifically mango peel , patient can eat  the fruit itself   Oxycontin [Oxycodone]     Dizziness     Review of Systems: All systems reviewed and negative except where noted in HPI.    MM 3D SCREEN BREAST BILATERAL  Result Date: 01/30/2021 CLINICAL DATA:  Screening. EXAM: DIGITAL SCREENING BILATERAL MAMMOGRAM WITH TOMOSYNTHESIS AND CAD TECHNIQUE: Bilateral screening digital craniocaudal and mediolateral oblique mammograms were obtained. Bilateral screening digital breast tomosynthesis was performed. The images were evaluated with computer-aided detection. COMPARISON:  Previous exam(s). ACR Breast Density Category a: The breast tissue is almost entirely fatty. FINDINGS: There are no findings suspicious for malignancy. IMPRESSION: No mammographic evidence of malignancy. A result letter of this screening mammogram will be mailed directly to the patient. RECOMMENDATION: Screening mammogram in one year. (Code:SM-B-01Y) BI-RADS CATEGORY  1: Negative. Electronically Signed   By: Ammie Ferrier M.D.   On: 01/30/2021 11:17    Physical Exam: BP (!) 141/68    Pulse 64    Ht _0  (1.626 m)    Wt 206 lb (93.4 kg)    LMP 08/08/2010    BMI 35.36 kg/m  Constitutional: Pleasant,well-developed, Caucasian female in no acute distress.  Accompanied by husband HEENT: Normocephalic and atraumatic. Conjunctivae are normal. No scleral icterus. Neck supple.  Cardiovascular: Normal rate, regular rhythm.  Pulmonary/chest: Effort normal and breath sounds normal. No wheezing, rales or rhonchi. Abdominal: Soft, nondistended, nontender. Bowel sounds active throughout. There are no masses palpable. No hepatomegaly. Extremities: no edema Lymphadenopathy: No cervical adenopathy noted. Neurological: Alert and oriented to person place and time. Skin: Skin is warm and dry. No rashes noted. Psychiatric: Normal mood and affect. Behavior is normal.  CBC    Component Value Date/Time   WBC 7.6 02/12/2021 1353   WBC 11.5 (H) 11/29/2020 0340   RBC 4.56 02/12/2021  1353   HGB 10.7 (L) 02/12/2021 1353   HGB 9.2 (L) 01/23/2021 0945   HCT 36.6 02/12/2021 1353   HCT 30.8 (L) 01/23/2021 0945   PLT 272 02/12/2021 1353   PLT 313 01/23/2021 0945   MCV 80.3 02/12/2021 1353   MCV 77 (L) 01/23/2021 0945   MCH 23.5 (L) 02/12/2021 1353   MCHC 29.2 (L) 02/12/2021 1353   RDW 19.0 (H) 02/12/2021 1353   RDW 15.2 01/23/2021 0945   LYMPHSABS 0.8 02/12/2021 1353   LYMPHSABS 1.2 01/23/2021 0945   MONOABS 0.8 02/12/2021 1353   EOSABS 0.2 02/12/2021 1353   EOSABS 0.4 01/23/2021 0945   BASOSABS 0.1 02/12/2021 1353   BASOSABS 0.1 01/23/2021 0945    CMP  Component Value Date/Time   NA 139 11/29/2020 0340   K 4.6 11/29/2020 0340   CL 104 11/29/2020 0340   CO2 26 11/29/2020 0340   GLUCOSE 122 (H) 11/29/2020 0340   BUN 22 11/29/2020 0340   CREATININE 0.70 11/29/2020 0340   CALCIUM 8.6 (L) 11/29/2020 0340   GFRNONAA >60 11/29/2020 0340   GFRAA  07/22/2010 0810    >60        The eGFR has been calculated using the MDRD equation. This calculation has not been validated in all clinical situations. eGFR's persistently <60 mL/min signify possible Chronic Kidney Disease.    Component Ref Range & Units 2 wk ago  (02/12/21) 1 mo ago  (01/23/21) 3 mo ago  (11/29/20) 3 mo ago  (11/28/20) 10 yr ago  (07/22/10) 10 yr ago  (07/21/10) 10 yr ago  (07/13/10) 10 yr ago  (07/13/10)  WBC Count 4.0 - 10.5 K/uL 7.6  6.2 R  11.5 High   9.4  12.4 High   9.8   9.0   RBC 3.87 - 5.11 MIL/uL 4.56  3.99 R  3.37 Low   4.41  3.82 Low   3.54 Low    4.57   Hemoglobin 12.0 - 15.0 g/dL 10.7 Low   9.2 Low  R  8.3 Low   10.8 Low   11.7 Low   11.0 Low    14.1   HCT 36.0 - 46.0 % 36.6  30.8 Low  R  27.3 Low   35.6 Low   34.9 Low   32.5 Low    41.9   MCV 80.0 - 100.0 fL 80.3  77 Low  R  81.0  80.7  91.4 R  91.8 R   91.7      Component Ref Range & Units 3 wk ago   Total Iron Binding Capacity 250 - 450 ug/dL 479 High    UIBC 118 - 369 ug/dL 449 High    Iron 27 - 139 ug/dL 30   Iron  Saturation 15 - 55 % 6 Low Panic    Ferritin 15 - 150 ng/mL 29    Component Ref Range & Units 3 wk ago   COLOGUARD Negative Negative   Colonoscopy 2012, hyperplastic polyp Dr. Saralyn Pilar  ASSESSMENT AND PLAN: 65 year old female with unexplained iron deficiency anemia, without overt GI blood loss, and no chronic GI symptoms, other than decreased appetite and 15 pounds of unintentional weight loss.  Although she did have a negative Cologuard, given constellation of the symptoms I think that both an upper and lower endoscopy are warranted.  We discussed proceeding with an upper endoscopy first, then proceeding with a colonoscopy if no etiology found on the upper, but decided to proceed with an upper and lower at the same time to expedite evaluation.  In the meantime, patient should continue her oral iron.  Iron deficiency anemia - EGD/Colo - Continue oral iron BID  The details, risks (including bleeding, perforation, infection, missed lesions, medication reactions and possible hospitalization or surgery if complications occur), benefits, and alternatives to EGD/colonoscopy with possible biopsy and possible polypectomy were discussed with the patient and she consents to proceed.   Leanny Moeckel E. Candis Schatz, MD [facility]    CC:  Early, Coralee Pesa, NP

## 2021-03-02 ENCOUNTER — Encounter: Payer: Self-pay | Admitting: Gastroenterology

## 2021-03-04 DIAGNOSIS — C801 Malignant (primary) neoplasm, unspecified: Secondary | ICD-10-CM

## 2021-03-04 HISTORY — DX: Malignant (primary) neoplasm, unspecified: C80.1

## 2021-03-22 ENCOUNTER — Encounter: Payer: Self-pay | Admitting: Gastroenterology

## 2021-03-22 ENCOUNTER — Other Ambulatory Visit: Payer: Self-pay

## 2021-03-22 ENCOUNTER — Ambulatory Visit (AMBULATORY_SURGERY_CENTER): Payer: Medicare PPO | Admitting: Gastroenterology

## 2021-03-22 VITALS — BP 116/95 | HR 59 | Temp 98.6°F | Resp 12 | Ht 64.0 in | Wt 206.0 lb

## 2021-03-22 DIAGNOSIS — K2970 Gastritis, unspecified, without bleeding: Secondary | ICD-10-CM | POA: Diagnosis not present

## 2021-03-22 DIAGNOSIS — D509 Iron deficiency anemia, unspecified: Secondary | ICD-10-CM

## 2021-03-22 DIAGNOSIS — K449 Diaphragmatic hernia without obstruction or gangrene: Secondary | ICD-10-CM

## 2021-03-22 DIAGNOSIS — K5730 Diverticulosis of large intestine without perforation or abscess without bleeding: Secondary | ICD-10-CM

## 2021-03-22 DIAGNOSIS — K319 Disease of stomach and duodenum, unspecified: Secondary | ICD-10-CM

## 2021-03-22 MED ORDER — SODIUM CHLORIDE 0.9 % IV SOLN: 500.0000 mL | INTRAVENOUS | Status: DC

## 2021-03-22 NOTE — Patient Instructions (Signed)
YOU HAD AN ENDOSCOPIC PROCEDURE TODAY: Refer to the procedure report and other information in the discharge instructions given to you for any specific questions about what was found during the examination. If this information does not answer your questions, please call [place] office at [phone] to clarify.  ° °YOU SHOULD EXPECT: Some feelings of bloating in the abdomen. Passage of more gas than usual. Walking can help get rid of the air that was put into your GI tract during the procedure and reduce the bloating. If you had a lower endoscopy (such as a colonoscopy or flexible sigmoidoscopy) you may notice spotting of blood in your stool or on the toilet paper. Some abdominal soreness may be present for a day or two, also. ° °DIET: Your first meal following the procedure should be a light meal and then it is ok to progress to your normal diet. A half-sandwich or bowl of soup is an example of a good first meal. Heavy or fried foods are harder to digest and may make you feel nauseous or bloated. Drink plenty of fluids but you should avoid alcoholic beverages for 24 hours. If you had a esophageal dilation, please see attached instructions for diet.   ° °ACTIVITY: Your care partner should take you home directly after the procedure. You should plan to take it easy, moving slowly for the rest of the day. You can resume normal activity the day after the procedure however YOU SHOULD NOT DRIVE, use power tools, machinery or perform tasks that involve climbing or major physical exertion for 24 hours (because of the sedation medicines used during the test).  ° °SYMPTOMS TO REPORT IMMEDIATELY: °A gastroenterologist can be reached at any hour. Please call [phone]  for any of the following symptoms:  °Following lower endoscopy (colonoscopy, flexible sigmoidoscopy) °Excessive amounts of blood in the stool  °Significant tenderness, worsening of abdominal pains  °Swelling of the abdomen that is new, acute  °Fever of 100° or  higher  °Following upper endoscopy (EGD, EUS, ERCP, esophageal dilation) °Vomiting of blood or coffee ground material  °New, significant abdominal pain  °New, significant chest pain or pain under the shoulder blades  °Painful or persistently difficult swallowing  °New shortness of breath  °Black, tarry-looking or red, bloody stools ° °FOLLOW UP:  °If any biopsies were taken you will be contacted by phone or by letter within the next 1-3 weeks. Call [phone]  if you have not heard about the biopsies in 3 weeks.  °Please also call with any specific questions about appointments or follow up tests. ° °

## 2021-03-22 NOTE — Progress Notes (Signed)
Vss nad trans to pacu °

## 2021-03-22 NOTE — Progress Notes (Signed)
History and Physical Interval Note:  03/22/2021 1:43 PM  [name]  has presented today for endoscopic procedure(s), with the diagnosis of  Encounter Diagnosis  Name Primary?   Iron deficiency anemia, unspecified iron deficiency anemia type Yes  .  The various methods of evaluation and treatment have been discussed with the patient and/or family. After consideration of risks, benefits and other options for treatment, the patient has consented to  the endoscopic procedure(s).   The patient's history has been reviewed, patient examined, no change in status, stable for endoscopic procedure(s).  I have reviewed the patient's chart and labs.  Questions were answered to the patient's satisfaction.     Calub Tarnow E. Candis Schatz, MD [facility]

## 2021-03-22 NOTE — Progress Notes (Signed)
Patient ID and intended procedure confirmed with present staff. Received instructions for my participation in the procedure from the performing physician.

## 2021-03-22 NOTE — Op Note (Signed)
[place] Patient Name: [name] Procedure Date: 03/22/2021 1:44 PM MRN: [id number] Endoscopist: Nicki Reaper E. Candis Schatz , MD Age: 66 Referring MD:  Date of Birth: August 24, 1955 Gender: Female Account #: [id number] Procedure:                Upper GI endoscopy Indications:              Iron deficiency anemia Medicines:                Monitored Anesthesia Care Procedure:                Pre-Anesthesia Assessment:                           - Prior to the procedure, a History and Physical                            was performed, and patient medications and                            allergies were reviewed. The patient's tolerance of                            previous anesthesia was also reviewed. The risks                            and benefits of the procedure and the sedation                            options and risks were discussed with the patient.                            All questions were answered, and informed consent                            was obtained. Prior Anticoagulants: The patient has                            taken no previous anticoagulant or antiplatelet                            agents. ASA Grade Assessment: II - A patient with                            mild systemic disease. After reviewing the risks                            and benefits, the patient was deemed in                            satisfactory condition to undergo the procedure.                           After obtaining informed consent, the endoscope was  passed under direct vision. Throughout the                            procedure, the patient's blood pressure, pulse, and                            oxygen saturations were monitored continuously. The                            GIF [id number] #[id number] was introduced through the                            mouth, and advanced to the second part of duodenum.                            The upper GI endoscopy was  accomplished without                            difficulty. The patient tolerated the procedure                            well. Scope In: Scope Out: Findings:                 The examined portions of the nasopharynx,                            oropharynx and larynx were normal.                           The examined esophagus was normal.                           A 9 cm hiatal hernia was present.                           Diffuse mild inflammation characterized by adherent                            blood, erythema and granularity was found in the                            gastric body and in the gastric antrum. Biopsies                            were taken with a cold forceps for Helicobacter                            pylori testing. Estimated blood loss was minimal.                           The examined duodenum was normal. Complications:            No immediate complications. Estimated Blood Loss:     Estimated blood loss was minimal. Impression:               -  The examined portions of the nasopharynx,                            oropharynx and larynx were normal.                           - Normal esophagus.                           - 9 cm hiatal hernia.                           - Gastritis. Biopsied.                           - Normal examined duodenum.                           - No obvious etiologies for iron deficiency anemia,                            although chronic gastritis could potentially be a                            cause. Given size of hiatal hernia, [name] lesion                            also possible, although none seen on exam today. Recommendation:           - Patient has a contact number available for                            emergencies. The signs and symptoms of potential                            delayed complications were discussed with the                            patient. Return to normal activities tomorrow.                             Written discharge instructions were provided to the                            patient.                           - Resume previous diet.                           - Continue present medications.                           - Await pathology results.                           - Recommend CT chest/abdomen to better visualize  hiatal hernia                           - Recommend referral to General Surgery to consider                            hiatal hernia repair                           - Resume oral iron replacement therapy                           - Recheck CBC in 1 month.                           - Avoid/minimize NSAID use. [name] E. Candis Schatz, MD 03/22/2021 2:59:26 PM This report has been signed electronically.

## 2021-03-22 NOTE — Op Note (Signed)
[place] Patient Name: [name] Procedure Date: 03/22/2021 1:43 PM MRN: [id number] Endoscopist: Nicki Reaper E. Candis Schatz , MD Age: 66 Referring MD:  Date of Birth: [birth year] Gender: Female Account #: [id number] Procedure:                Colonoscopy Indications:              Iron deficiency anemia Medicines:                Monitored Anesthesia Care Procedure:                Pre-Anesthesia Assessment:                           - Prior to the procedure, a History and Physical                            was performed, and patient medications and                            allergies were reviewed. The patient's tolerance of                            previous anesthesia was also reviewed. The risks                            and benefits of the procedure and the sedation                            options and risks were discussed with the patient.                            All questions were answered, and informed consent                            was obtained. Prior Anticoagulants: The patient has                            taken no previous anticoagulant or antiplatelet                            agents. ASA Grade Assessment: II - A patient with                            mild systemic disease. After reviewing the risks                            and benefits, the patient was deemed in                            satisfactory condition to undergo the procedure.                           After obtaining informed consent, the colonoscope  was passed under direct vision. Throughout the                            procedure, the patient's blood pressure, pulse, and                            oxygen saturations were monitored continuously. The                            CF [id number] #[id number] was introduced through the anus                            and advanced to the the cecum, identified by                            appendiceal orifice and ileocecal  valve. The                            colonoscopy was somewhat difficult due to a                            tortuous colon. Successful completion of the                            procedure was aided by using manual pressure. The                            patient tolerated the procedure well. The quality                            of the bowel preparation was good. The ileocecal                            valve, appendiceal orifice, and rectum were                            photographed. Scope In: 2:25:54 PM Scope Out: 2:47:17 PM Scope Withdrawal Time: 0 hours 15 minutes 7 seconds  Total Procedure Duration: 0 hours 21 minutes 23 seconds  Findings:                 The perianal and digital rectal examinations were                            normal. Pertinent negatives include normal                            sphincter tone and no palpable rectal lesions.                           Multiple small and large-mouthed diverticula were                            found in the sigmoid colon and descending colon.  The exam was otherwise normal throughout the                            examined colon.                           The retroflexed view of the distal rectum and anal                            verge was normal and showed no anal or rectal                            abnormalities. Complications:            No immediate complications. Estimated Blood Loss:     Estimated blood loss: none. Impression:               - Diverticulosis in the sigmoid colon and in the                            descending colon.                           - The distal rectum and anal verge are normal on                            retroflexion view.                           - No specimens collected.                           - No abnormalities to explain iron deficiency anemia Recommendation:           - Patient has a contact number available for                            emergencies.  The signs and symptoms of potential                            delayed complications were discussed with the                            patient. Return to normal activities tomorrow.                            Written discharge instructions were provided to the                            patient.                           - Resume previous diet.                           - Continue present medications.                           -  Repeat colonoscopy in 10 years for screening                            purposes. Mirko Tailor E. Candis Schatz, MD 03/22/2021 3:02:34 PM This report has been signed electronically.

## 2021-03-26 ENCOUNTER — Inpatient Hospital Stay: Payer: Medicare PPO

## 2021-03-26 ENCOUNTER — Other Ambulatory Visit: Payer: Self-pay

## 2021-03-26 ENCOUNTER — Telehealth: Payer: Self-pay | Admitting: *Deleted

## 2021-03-26 ENCOUNTER — Encounter: Payer: Self-pay | Admitting: Gastroenterology

## 2021-03-26 ENCOUNTER — Encounter: Payer: Self-pay | Admitting: Nurse Practitioner

## 2021-03-26 ENCOUNTER — Inpatient Hospital Stay: Payer: Medicare PPO | Attending: Nurse Practitioner | Admitting: Nurse Practitioner

## 2021-03-26 VITALS — BP 145/65 | HR 73 | Temp 97.8°F | Resp 18 | Ht 64.0 in | Wt 207.2 lb

## 2021-03-26 DIAGNOSIS — D649 Anemia, unspecified: Secondary | ICD-10-CM | POA: Diagnosis not present

## 2021-03-26 DIAGNOSIS — K2950 Unspecified chronic gastritis without bleeding: Secondary | ICD-10-CM | POA: Insufficient documentation

## 2021-03-26 DIAGNOSIS — K449 Diaphragmatic hernia without obstruction or gangrene: Secondary | ICD-10-CM | POA: Insufficient documentation

## 2021-03-26 DIAGNOSIS — R110 Nausea: Secondary | ICD-10-CM | POA: Insufficient documentation

## 2021-03-26 DIAGNOSIS — I10 Essential (primary) hypertension: Secondary | ICD-10-CM | POA: Diagnosis not present

## 2021-03-26 DIAGNOSIS — D509 Iron deficiency anemia, unspecified: Secondary | ICD-10-CM | POA: Insufficient documentation

## 2021-03-26 DIAGNOSIS — J45909 Unspecified asthma, uncomplicated: Secondary | ICD-10-CM | POA: Diagnosis not present

## 2021-03-26 DIAGNOSIS — R634 Abnormal weight loss: Secondary | ICD-10-CM | POA: Diagnosis not present

## 2021-03-26 DIAGNOSIS — R0609 Other forms of dyspnea: Secondary | ICD-10-CM | POA: Diagnosis not present

## 2021-03-26 DIAGNOSIS — R630 Anorexia: Secondary | ICD-10-CM | POA: Insufficient documentation

## 2021-03-26 LAB — CBC WITH DIFFERENTIAL (CANCER CENTER ONLY)
Abs Immature Granulocytes: 0.02 10*3/uL (ref 0.00–0.07)
Basophils Absolute: 0.1 10*3/uL (ref 0.0–0.1)
Basophils Relative: 1 %
Eosinophils Absolute: 0.4 10*3/uL (ref 0.0–0.5)
Eosinophils Relative: 5 %
HCT: 41.3 % (ref 36.0–46.0)
Hemoglobin: 12.8 g/dL (ref 12.0–15.0)
Immature Granulocytes: 0 %
Lymphocytes Relative: 12 %
Lymphs Abs: 0.9 10*3/uL (ref 0.7–4.0)
MCH: 25.9 pg — ABNORMAL LOW (ref 26.0–34.0)
MCHC: 31 g/dL (ref 30.0–36.0)
MCV: 83.4 fL (ref 80.0–100.0)
Monocytes Absolute: 0.6 10*3/uL (ref 0.1–1.0)
Monocytes Relative: 9 %
Neutro Abs: 5.5 10*3/uL (ref 1.7–7.7)
Neutrophils Relative %: 73 %
Platelet Count: 232 10*3/uL (ref 150–400)
RBC: 4.95 MIL/uL (ref 3.87–5.11)
RDW: 19.8 % — ABNORMAL HIGH (ref 11.5–15.5)
WBC Count: 7.5 10*3/uL (ref 4.0–10.5)
nRBC: 0 % (ref 0.0–0.2)

## 2021-03-26 LAB — FERRITIN: Ferritin: 27 ng/mL (ref 11–307)

## 2021-03-26 MED ORDER — OMEPRAZOLE 20 MG PO CPDR: 20.0000 mg | DELAYED_RELEASE_CAPSULE | Freq: Every day | ORAL | 0 refills | Status: DC

## 2021-03-26 NOTE — Progress Notes (Signed)
°  [facility] OFFICE PROGRESS NOTE   Diagnosis: Anemia  INTERVAL HISTORY:   [name] returns as scheduled.  She continues oral iron.  She reports she is tolerating the iron well.  Energy level is much better.  She is no longer taking naps during the day.  She continues to have dyspnea on exertion.  Objective:  Vital signs in last 24 hours:  Blood pressure (!) 145/65, pulse 73, temperature 97.8 F (36.6 C), temperature source Oral, resp. rate 18, height 5\' 4"  (1.626 m), weight 207 lb 3.2 oz (94 kg), last menstrual period 08/08/2010, SpO2 98 %.    Resp: Lungs clear bilaterally. Cardio: Regular rate and rhythm. GI: Abdomen soft and nontender.  No hepatosplenomegaly. Vascular: No leg edema.   Lab Results:  Lab Results  Component Value Date   WBC 7.5 03/26/2021   HGB 12.8 03/26/2021   HCT 41.3 03/26/2021   MCV 83.4 03/26/2021   PLT 232 03/26/2021   NEUTROABS 5.5 03/26/2021    Imaging:  No results found.  Medications: I have reviewed the patient's current medications.  Assessment/Plan: Iron deficiency anemia Colonoscopy 03/22/2021-diverticulosis in the sigmoid colon and in the descending colon Upper endoscopy 03/22/2021-normal esophagus, 9 cm hiatal hernia, gastritis (biopsied, results unavailable); Dr. Candis Schatz noted no obvious etiologies for iron deficiency anemia although chronic gastritis could potentially be a cause and given the size of the hiatal hernia [name] lesion also possible although none seen on the upper endoscopy.  Referred for CT chest/abdomen for better visualization of the hiatal hernia and referred to general surgery to consider hiatal hernia repair. Dyspnea on exertion Anorexia/weight loss Intermittent nausea/upper abdominal discomfort Asthma Hypertension  Disposition: [name] appears stable.  We reviewed the CBC.  Hemoglobin has corrected into normal range.  We will follow-up on the ferritin from today.  She has undergone GI  evaluation.  Dr. Dayle Points note indicates that the iron deficiency anemia could be related to chronic gastritis or potentially the hiatal hernia.  She will continue oral iron for now.  She is scheduled to have a follow-up CBC with GI in about a month.  She has been referred for CT chest/abdomen and surgical consultation due to the hiatal hernia.  She has persistent dyspnea on exertion.  As noted above hemoglobin has corrected into normal range.  She understands the dyspnea at this point is not related to anemia.  She plans to discuss the possibility of the dyspnea being related to the hiatal hernia with Dr. Candis Schatz.  I also recommended she follow-up with Jacolyn Reedy, NP.  She will return for a CBC and follow-up visit in 3 months.    Ned Card ANP/GNP-BC   03/26/2021  11:51 AM

## 2021-03-26 NOTE — Telephone Encounter (Signed)
°  Follow up Call-  Call back number 03/22/2021  Post procedure Call Back phone  # [phone]  Permission to leave phone message Yes  Some recent data might be hidden     Patient questions:  Do you have a fever, pain , or abdominal swelling? No. Pain Score  0 *  Have you tolerated food without any problems? Yes.    Have you been able to return to your normal activities? Yes.    Do you have any questions about your discharge instructions: Diet   No. Medications  No. Follow up visit  No.  Do you have questions or concerns about your Care? Yes.    Actions: * If pain score is 4 or above: No action needed, pain <4.    Pt having burning in her stomach since the Endoscopy .Pt taking 3 Tums frequently. Patient states the "Tums help for about an hour before the burning returns."

## 2021-03-26 NOTE — Telephone Encounter (Signed)
Notified pt of doctors recommendations and verified pharmacy. Pt verbalized understanding of recommendations.

## 2021-03-27 ENCOUNTER — Telehealth: Payer: Self-pay

## 2021-03-27 NOTE — Telephone Encounter (Signed)
-----   Message from Owens Shark, NP sent at 03/27/2021  8:23 AM EST ----- Please let her know ferritin remains at lower end of the normal range.  Continue oral iron.  Follow-up as scheduled.

## 2021-03-27 NOTE — Telephone Encounter (Signed)
Called and spoke with the patient to let her know  ferritin remains at lower end of the normal range. To continue oral iron and follow up as schedule

## 2021-03-28 NOTE — Progress Notes (Signed)
[name] The biopsies taken from your stomach were notable for mild reactive gastropathy which is a common finding and often related to use of certain medications (usually NSAIDs), but there was no evidence of Helicobacter pylori infection. This common finding is not felt to necessarily be a cause of any particular symptom and there is no specific treatment or further evaluation recommended.  Kao Berkheimer E. Candis Schatz, MD [facility]

## 2021-03-30 ENCOUNTER — Encounter (HOSPITAL_BASED_OUTPATIENT_CLINIC_OR_DEPARTMENT_OTHER): Payer: Self-pay | Admitting: Nurse Practitioner

## 2021-03-30 DIAGNOSIS — D508 Other iron deficiency anemias: Secondary | ICD-10-CM

## 2021-03-30 MED ORDER — FERROUS SULFATE 325 (65 FE) MG PO TBEC: 650.0000 mg | DELAYED_RELEASE_TABLET | Freq: Every day | ORAL | 0 refills | Status: DC

## 2021-04-04 ENCOUNTER — Ambulatory Visit (HOSPITAL_BASED_OUTPATIENT_CLINIC_OR_DEPARTMENT_OTHER): Payer: Medicare PPO | Admitting: Nurse Practitioner

## 2021-04-04 ENCOUNTER — Encounter (HOSPITAL_BASED_OUTPATIENT_CLINIC_OR_DEPARTMENT_OTHER): Payer: Self-pay | Admitting: Nurse Practitioner

## 2021-04-04 ENCOUNTER — Other Ambulatory Visit: Payer: Self-pay

## 2021-04-04 VITALS — BP 117/72 | HR 98 | Resp 64 | Wt 207.3 lb

## 2021-04-04 DIAGNOSIS — R0602 Shortness of breath: Secondary | ICD-10-CM | POA: Diagnosis not present

## 2021-04-04 NOTE — Progress Notes (Signed)
Established Patient Office Visit  Subjective:  Patient ID: [name], female    DOB: 07/01/1955  Age: 66 y.o. MRN: [id number]  CC:  Chief Complaint  Patient presents with   Follow-up    Patient presents in clinic today due to shortness of breath. She is very upset with the GI results and doesn't understand.     HPI [name] presents for follow-up for shortness of breath.  She was previously seen and found to be anemic which was thought to be causing the symptoms. She has been evaluated by hematology and her hemoglobin has been normalized. She has also been evaluated by GI and no cause of bleeding was identified. She does have a large hiatal hernia and has an upcoming CT scan for further assessment of that. At this time she is still experiencing shortness of breath at rest and with exertion. She is critically frustrated at this time as she is confused about the findings with the hiatal hernia and the report from the GI provider that this cannot be causing her symptoms.  She does not necessarily want to see another specialist but would like to get to the bottom of this as this is quite concerning and interfering with her activities of daily living.  Past Medical History:  Diagnosis Date   Arthritis    Asthma    Depression    Goiter    Hypertension     Past Surgical History:  Procedure Laterality Date   CHOLECYSTECTOMY     FOOT SURGERY     KNEE SURGERY Left    TKA x2    1974, 2014   SHOULDER SURGERY     TOTAL HIP ARTHROPLASTY Left    TOTAL KNEE ARTHROPLASTY Right 11/28/2020   Procedure: TOTAL KNEE ARTHROPLASTY;  Surgeon: Paralee Cancel, MD;  Location: [facility];  Service: Orthopedics;  Laterality: Right;    Family History  Problem Relation Age of Onset   Breast cancer Maternal Grandmother     Social History   Socioeconomic History   Marital status: Married    Spouse name: Not on file   Number of children: Not on file   Years of education: Not on file   Highest  education level: Not on file  Occupational History   Not on file  Tobacco Use   Smoking status: Former    Packs/day: 1.00    Years: 4.00    Pack years: 4.00    Types: Cigarettes    Quit date: 49    Years since quitting: 49.1   Smokeless tobacco: Never  Vaping Use   Vaping Use: Never used  Substance and Sexual Activity   Alcohol use: Yes    Comment: Wine occasional   Drug use: No   Sexual activity: Not on file  Other Topics Concern   Not on file  Social History Narrative   Not on file   Social Determinants of Health   Financial Resource Strain: Not on file  Food Insecurity: Not on file  Transportation Needs: Not on file  Physical Activity: Not on file  Stress: Not on file  Social Connections: Not on file  Intimate Partner Violence: Not on file    Outpatient Medications Prior to Visit  Medication Sig Dispense Refill   ADVAIR DISKUS 250-50 MCG/DOSE AEPB Inhale 2 puffs into the lungs 2 (two) times daily.     albuterol (VENTOLIN HFA) 108 (90 Base) MCG/ACT inhaler Inhale 2 puffs into the lungs every 6 (six) hours as  needed for wheezing or shortness of breath. 1 each 3   Ascorbic Acid (VITAMIN C) 1000 MG tablet Take 1,000 mg by mouth daily.     calcium carbonate (TUMS - DOSED IN MG ELEMENTAL CALCIUM) 500 MG chewable tablet Chew 1,000 mg by mouth daily as needed for indigestion or heartburn.     celecoxib (CELEBREX) 200 MG capsule celecoxib 200 mg capsule  Take 1 capsule twice a day by oral route with meals for 30 days.     cetirizine (ZYRTEC) 10 MG tablet Take 1 tablet (10 mg total) by mouth 2 (two) times daily. 60 tablet 3   Cholecalciferol (VITAMIN D) 50 MCG (2000 UT) tablet Take 1 tablet (2,000 Units total) by mouth 2 (two) times daily. 60 tablet 3   ferrous sulfate 325 (65 FE) MG EC tablet Take 2 tablets (650 mg total) by mouth daily. Take with food to avoid stomach upset. 180 tablet 0   hydrochlorothiazide (HYDRODIURIL) 25 MG tablet Take 1 tablet (25 mg total) by mouth  every morning. 30 tablet 3   Magnesium 400 MG TABS Take 400 mg by mouth daily. 30 tablet 3   omeprazole (PRILOSEC) 20 MG capsule Take 1 capsule (20 mg total) by mouth daily. Take 30-45  minutes before breakfast 30 capsule 0   sertraline (ZOLOFT) 100 MG tablet Take 1.5 tablets (150 mg total) by mouth every morning. 45 tablet 3   No facility-administered medications prior to visit.    Allergies  Allergen Reactions   Mango Flavor [Flavoring Agent] Anaphylaxis    Specifically mango peel , patient can eat the fruit itself   Oxycontin [Oxycodone]     Dizziness    ROS Review of Systems All review of systems negative except what is listed in the HPI    Objective:    Physical Exam Vitals and nursing note reviewed.  Constitutional:      Appearance: Normal appearance.  HENT:     Head: Normocephalic.  Eyes:     Extraocular Movements: Extraocular movements intact.     Conjunctiva/sclera: Conjunctivae normal.     Pupils: Pupils are equal, round, and reactive to light.  Neck:     Vascular: No carotid bruit.  Cardiovascular:     Rate and Rhythm: Normal rate and regular rhythm.     Pulses: Normal pulses.     Heart sounds: Normal heart sounds.  Pulmonary:     Effort: Pulmonary effort is normal.     Breath sounds: Normal breath sounds.     Comments: Decreased air movement noted in bases bilaterally. Musculoskeletal:     Cervical back: Normal range of motion.     Right lower leg: No edema.     Left lower leg: No edema.  Skin:    General: Skin is warm and dry.     Capillary Refill: Capillary refill takes less than 2 seconds.  Neurological:     General: No focal deficit present.     Mental Status: She is alert and oriented to person, place, and time.  Psychiatric:        Mood and Affect: Mood normal.        Behavior: Behavior normal.        Thought Content: Thought content normal.        Judgment: Judgment normal.    BP 117/72    Pulse 98    Resp (!) 64    Wt 207 lb 4.8 oz (94  kg)    LMP 08/08/2010  BMI 35.58 kg/m  Wt Readings from Last 3 Encounters:  04/04/21 207 lb 4.8 oz (94 kg)  03/26/21 207 lb 3.2 oz (94 kg)  03/22/21 206 lb (93.4 kg)     Health Maintenance Due  Topic Date Due   Pneumonia Vaccine 62+ Years old (1 - PCV) Never done   Hepatitis C Screening  Never done   TETANUS/TDAP  Never done   Zoster Vaccines- Shingrix (1 of 2) Never done   COVID-19 Vaccine (2 - Pfizer series) 01/08/2021    There are no preventive care reminders to display for this patient.  No results found for: TSH Lab Results  Component Value Date   WBC 7.5 03/26/2021   HGB 12.8 03/26/2021   HCT 41.3 03/26/2021   MCV 83.4 03/26/2021   PLT 232 03/26/2021   Lab Results  Component Value Date   NA 139 11/29/2020   K 4.6 11/29/2020   CO2 26 11/29/2020   GLUCOSE 122 (H) 11/29/2020   BUN 22 11/29/2020   CREATININE 0.90 04/06/2021   CALCIUM 8.6 (L) 11/29/2020   ANIONGAP 9 11/29/2020   No results found for: CHOL No results found for: HDL No results found for: LDLCALC No results found for: TRIG No results found for: CHOLHDL No results found for: HGBA1C    Assessment & Plan:   Problem List Items Addressed This Visit     Shortness of breath - Primary    Continued shortness of breath of unknown etiology. Patient's anemia has been resolved however she is still taking very high doses of ferrous sulfate to maintain her current levels and no identification of bleeding or blood loss have been identified at this time. Review of previous provider notes today which do show a very large hiatal hernia of 9 cm.  GI note does suggest possible GI etiology that could not be reviewed on the upper endoscopy. She has not been evaluated by pulmonology or had a pulmonary function test.  I do recommend this at this time as this could be pulmonary in nature. Discussed with the patient the option to monitor for CT results and determine if there are any concerning findings in the lungs as  well as turning the placement of the hernia. I strongly suspect that the hernia could be interfering with her air movement as she does have decreased lung sounds in the bases and the size hernia could significantly impede the movement of air. Discussed with patient recommendations for discussing with general surgery options for treatment as well as referral to pulmonology. We will monitor the CT results and reach out to [facility] surgery to discuss patient's symptoms and conditions to determine their intake. We will plan to follow-up with patient after CT results have been received.      Relevant Orders   Ambulatory referral to Pulmonology    No orders of the defined types were placed in this encounter.   Follow-up: No follow-ups on file.    Orma Render, NP

## 2021-04-04 NOTE — Patient Instructions (Signed)
It is not clear at this time what could be causing your shortness of breath. There are instances where a hiatal hernia can press onto the diagragm that regulates breathing and can cause these symptoms, but this could be something else. I feel that this point we will definitely follow the CT scan to ensure that there is nothing going on in the lungs, but I would like for you to see pulmonology as well so we can rule out other causes.

## 2021-04-06 ENCOUNTER — Other Ambulatory Visit: Payer: Self-pay

## 2021-04-06 ENCOUNTER — Ambulatory Visit (HOSPITAL_BASED_OUTPATIENT_CLINIC_OR_DEPARTMENT_OTHER)
Admission: RE | Admit: 2021-04-06 | Discharge: 2021-04-06 | Disposition: A | Discharge status: Home / Self Care | Payer: Medicare PPO | Source: Ambulatory Visit | Attending: Gastroenterology | Admitting: Gastroenterology

## 2021-04-06 DIAGNOSIS — K7689 Other specified diseases of liver: Secondary | ICD-10-CM | POA: Diagnosis not present

## 2021-04-06 DIAGNOSIS — K449 Diaphragmatic hernia without obstruction or gangrene: Secondary | ICD-10-CM | POA: Diagnosis not present

## 2021-04-06 DIAGNOSIS — J984 Other disorders of lung: Secondary | ICD-10-CM | POA: Diagnosis not present

## 2021-04-06 LAB — POCT I-STAT CREATININE: Creatinine, Ser: 0.9 mg/dL (ref 0.44–1.00)

## 2021-04-06 MED ORDER — IOHEXOL 300 MG/ML  SOLN
100.0000 mL | Freq: Once | INTRAMUSCULAR | Status: AC | PRN
  Administered 2021-04-06: 100 mL via INTRAVENOUS

## 2021-04-06 NOTE — Assessment & Plan Note (Signed)
Continued shortness of breath of unknown etiology. Patient's anemia has been resolved however she is still taking very high doses of ferrous sulfate to maintain her current levels and no identification of bleeding or blood loss have been identified at this time. Review of previous provider notes today which do show a very large hiatal hernia of 9 cm.  GI note does suggest possible GI etiology that could not be reviewed on the upper endoscopy. She has not been evaluated by pulmonology or had a pulmonary function test.  I do recommend this at this time as this could be pulmonary in nature. Discussed with the patient the option to monitor for CT results and determine if there are any concerning findings in the lungs as well as turning the placement of the hernia. I strongly suspect that the hernia could be interfering with her air movement as she does have decreased lung sounds in the bases and the size hernia could significantly impede the movement of air. Discussed with patient recommendations for discussing with general surgery options for treatment as well as referral to pulmonology. We will monitor the CT results and reach out to [facility] surgery to discuss patient's symptoms and conditions to determine their intake. We will plan to follow-up with patient after CT results have been received.

## 2021-04-08 ENCOUNTER — Encounter (HOSPITAL_BASED_OUTPATIENT_CLINIC_OR_DEPARTMENT_OTHER): Payer: Self-pay | Admitting: Nurse Practitioner

## 2021-04-08 DIAGNOSIS — E041 Nontoxic single thyroid nodule: Secondary | ICD-10-CM

## 2021-04-10 NOTE — Progress Notes (Signed)
[name],  As you saw, you have a large hiatal hernia.  I would strongly recommend you discuss this with a surgeon and get it repaired. You also had some incidentally found thyroid nodules and a follow up ultrasound was recommended.  [name],  Can you please place the referral to [facility]?  NP Early,  Would your office be able to follow up on the incidental thyroid nodules?  Thanks

## 2021-04-12 ENCOUNTER — Encounter (HOSPITAL_BASED_OUTPATIENT_CLINIC_OR_DEPARTMENT_OTHER): Payer: Self-pay | Admitting: Nurse Practitioner

## 2021-04-16 ENCOUNTER — Encounter (HOSPITAL_BASED_OUTPATIENT_CLINIC_OR_DEPARTMENT_OTHER): Payer: Self-pay | Admitting: Nurse Practitioner

## 2021-04-16 ENCOUNTER — Ambulatory Visit
Admission: RE | Admit: 2021-04-16 | Discharge: 2021-04-16 | Disposition: A | Discharge status: Home / Self Care | Payer: Medicare PPO | Source: Ambulatory Visit | Attending: Nurse Practitioner | Admitting: Nurse Practitioner

## 2021-04-16 DIAGNOSIS — E041 Nontoxic single thyroid nodule: Secondary | ICD-10-CM

## 2021-04-21 ENCOUNTER — Other Ambulatory Visit: Payer: Self-pay | Admitting: Gastroenterology

## 2021-04-25 DIAGNOSIS — K449 Diaphragmatic hernia without obstruction or gangrene: Secondary | ICD-10-CM | POA: Diagnosis not present

## 2021-04-25 DIAGNOSIS — R0600 Dyspnea, unspecified: Secondary | ICD-10-CM | POA: Diagnosis not present

## 2021-04-25 DIAGNOSIS — E042 Nontoxic multinodular goiter: Secondary | ICD-10-CM | POA: Diagnosis not present

## 2021-04-25 NOTE — Progress Notes (Signed)
Synopsis: Referred for dyspnea by Early, Coralee Pesa, NP  Subjective:   PATIENT ID: [name] GENDER: female DOB: 22-Jan-1956, MRN: [id number]  Chief Complaint  Patient presents with   Pulmonary Consult    Referred by Jacolyn Reedy, NP. Pt states dx with Asthma at age 66. She has had increased SOB for the past 7 months. She has had issues with anemia. She states she gets winded with any exertion such as just walking room to room at home.    65yF with history of goiter, asthma on advair, HTN, large hiatal hernia referred for shortness of breath.   Never did get covid-19. She is vaccinated and boosted for covid-19.   She loves to garden, swim but is now very limited by her dyspnea. Has trouble even walking her dogs down the street. This has been essentially since June that she's noticed a decline. She has no orthopnea. She is unsure if she has worsening dyspnea walking into a pool with water up to her shoulders. She has no cough.   She has tried increasing dose of advair from 1 puff BID to 2 puffs BID with no improvement. To her this does not feel like asthma.   Not episodic. Feels this really whenever she tries to exert herself.   Last year had left hip and right knee replaced.   Otherwise pertinent review of systems is negative.  She has no family history of lung disease other than grandmother who had emphysema  Smoked for 4 years in distant past. MJ in distant past. No vaping. 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    Past Medical History:  Diagnosis Date   Arthritis    Asthma    Depression    Goiter    Hypertension      Family History  Problem Relation Age of Onset   Heart disease Mother    Breast cancer Maternal Grandmother    Emphysema Paternal Grandmother        never smoked but chewed tobacco     Past Surgical History:  Procedure Laterality Date   CHOLECYSTECTOMY     FOOT SURGERY     KNEE SURGERY Left    TKA x2     1974, 2014   SHOULDER SURGERY     TOTAL HIP ARTHROPLASTY Left    TOTAL KNEE ARTHROPLASTY Right 11/28/2020   Procedure: TOTAL KNEE ARTHROPLASTY;  Surgeon: Paralee Cancel, MD;  Location: [facility];  Service: Orthopedics;  Laterality: Right;    Social History   Socioeconomic History   Marital status: Married    Spouse name: Not on file   Number of children: Not on file   Years of education: Not on file   Highest education level: Not on file  Occupational History   Not on file  Tobacco Use   Smoking status: Former    Packs/day: 1.00    Years: 4.00    Pack years: 4.00    Types: Cigarettes    Quit date: 7    Years since quitting: 49.1   Smokeless tobacco: Never  Vaping Use   Vaping Use: Never used  Substance and Sexual Activity   Alcohol use: Yes    Comment: Wine occasional   Drug use: No   Sexual activity: Not on file  Other Topics Concern   Not on file  Social History Narrative   Not on file   Social Determinants of Health  Financial Resource Strain: Not on file  Food Insecurity: Not on file  Transportation Needs: Not on file  Physical Activity: Not on file  Stress: Not on file  Social Connections: Not on file  Intimate Partner Violence: Not on file     Allergies  Allergen Reactions   Mango Flavor [Flavoring Agent] Anaphylaxis    Specifically mango peel , patient can eat the fruit itself   Oxycontin [Oxycodone]     Dizziness     Outpatient Medications Prior to Visit  Medication Sig Dispense Refill   ADVAIR DISKUS 250-50 MCG/DOSE AEPB Inhale 2 puffs into the lungs 2 (two) times daily.     albuterol (VENTOLIN HFA) 108 (90 Base) MCG/ACT inhaler Inhale 2 puffs into the lungs every 6 (six) hours as needed for wheezing or shortness of breath. 1 each 3   Ascorbic Acid (VITAMIN C) 1000 MG tablet Take 1,000 mg by mouth daily.     calcium carbonate (TUMS - DOSED IN MG ELEMENTAL CALCIUM) 500 MG chewable tablet Chew 1,000 mg by mouth daily as needed for indigestion or  heartburn.     celecoxib (CELEBREX) 200 MG capsule celecoxib 200 mg capsule  Take 1 capsule twice a day by oral route with meals for 30 days.     cetirizine (ZYRTEC) 10 MG tablet Take 1 tablet (10 mg total) by mouth 2 (two) times daily. (Patient taking differently: Take 10 mg by mouth daily.) 60 tablet 3   Cholecalciferol (VITAMIN D) 50 MCG (2000 UT) tablet Take 1 tablet (2,000 Units total) by mouth 2 (two) times daily. 60 tablet 3   ferrous sulfate 325 (65 FE) MG EC tablet Take 2 tablets (650 mg total) by mouth daily. Take with food to avoid stomach upset. 180 tablet 0   hydrochlorothiazide (HYDRODIURIL) 25 MG tablet Take 1 tablet (25 mg total) by mouth every morning. 30 tablet 3   Magnesium 400 MG TABS Take 400 mg by mouth daily. 30 tablet 3   omeprazole (PRILOSEC) 20 MG capsule TAKE ONE CAPSULE BY MOUTH DAILY TAKE 30-45 MINUTES BEFORE BREAKFAST (Patient taking differently: As needed) 30 capsule 0   sertraline (ZOLOFT) 100 MG tablet Take 1.5 tablets (150 mg total) by mouth every morning. 45 tablet 3   ferrous sulfate 325 (65 FE) MG tablet Take 1 tablet by mouth daily.     No facility-administered medications prior to visit.       Objective:   Physical Exam:  General appearance: 66 y.o., female, NAD, conversant  Eyes: anicteric sclerae; PERRL, tracking appropriately HENT: NCAT; MMM Neck: Trachea midline; no lymphadenopathy, no JVD Lungs: CTAB, no crackles, no wheeze, with normal respiratory effort CV: RRR, no murmur  Abdomen: Soft, non-tender; non-distended, BS present  Extremities: No peripheral edema, warm Skin: Normal turgor and texture; no rash Psych: Appropriate affect Neuro: Alert and oriented to person and place, no focal deficit     Vitals:   04/26/21 0954  BP: 126/70  Pulse: 66  Temp: 98.5 F (36.9 C)  TempSrc: Oral  SpO2: 96%  Weight: 201 lb (91.2 kg)  Height: 5\' 5"  (8.315 m)   17% on RA BMI Readings from Last 3 Encounters:  04/26/21 33.45 kg/m  04/04/21  35.58 kg/m  03/26/21 35.57 kg/m   Wt Readings from Last 3 Encounters:  04/26/21 201 lb (91.2 kg)  04/04/21 207 lb 4.8 oz (94 kg)  03/26/21 207 lb 3.2 oz (94 kg)     CBC    Component Value Date/Time   WBC  7.5 03/26/2021 1114   WBC 11.5 (H) 11/29/2020 0340   RBC 4.95 03/26/2021 1114   HGB 12.8 03/26/2021 1114   HGB 9.2 (L) 01/23/2021 0945   HCT 41.3 03/26/2021 1114   HCT 30.8 (L) 01/23/2021 0945   PLT 232 03/26/2021 1114   PLT 313 01/23/2021 0945   MCV 83.4 03/26/2021 1114   MCV 77 (L) 01/23/2021 0945   MCH 25.9 (L) 03/26/2021 1114   MCHC 31.0 03/26/2021 1114   RDW 19.8 (H) 03/26/2021 1114   RDW 15.2 01/23/2021 0945   LYMPHSABS 0.9 03/26/2021 1114   LYMPHSABS 1.2 01/23/2021 0945   MONOABS 0.6 03/26/2021 1114   EOSABS 0.4 03/26/2021 1114   EOSABS 0.4 01/23/2021 0945   BASOSABS 0.1 03/26/2021 1114   BASOSABS 0.1 01/23/2021 0945    Eos 200-400  Chest Imaging: CT Chest 04/06/21 reviewed by me remarkable for large hiatal hernia, a little scar in RML, lingula, bases  Pulmonary Functions Testing Results: No flowsheet data found.    Echocardiogram:  None      Assessment & Plan:   # Shortness of breath Unclear etiology. Longstanding asthma history but doesn't feel like active asthma to her and increase in advair made no difference. CT Chest with large hiatal hernia but she really doesn't have any evidence of fibrotic lung disease. Doesn't seem anatomically likely that she'd have phrenic nerve compression from hiatal hernia and she has no orthopnea. Goiter does not seem to be compressing trachea on ct chest and she has no hoarseness, breathiness, stridor during my exam. Exam not suggestive of decompensated CHF. CAD possible, has not had stress testing. Deconditioning post orthopaedic surgery and untreated sleep apnea could also play roles.  Plan: - BMP, BNP, has had recent thyroid testing - full PFT - probably will end up getting TTE at least and/or stress testing with  cardiology visit  I spent 46 minutes dedicated to the care of this patient on the date of this encounter to include pre-visit review of records, face-to-face time with the patient discussing conditions above, post visit ordering of testing, clinical documentation with the electronic health record, and communicating necessary findings to members of the patients care team.     Christine Hurter, MD [facility] 04/26/2021 10:08 AM

## 2021-04-26 ENCOUNTER — Other Ambulatory Visit (INDEPENDENT_AMBULATORY_CARE_PROVIDER_SITE_OTHER): Payer: Medicare PPO

## 2021-04-26 ENCOUNTER — Encounter: Payer: Self-pay | Admitting: Student

## 2021-04-26 ENCOUNTER — Ambulatory Visit: Payer: Medicare PPO | Admitting: Student

## 2021-04-26 ENCOUNTER — Other Ambulatory Visit: Payer: Self-pay

## 2021-04-26 VITALS — BP 126/70 | HR 66 | Temp 98.5°F | Ht 65.0 in | Wt 201.0 lb

## 2021-04-26 DIAGNOSIS — R0609 Other forms of dyspnea: Secondary | ICD-10-CM | POA: Diagnosis not present

## 2021-04-26 DIAGNOSIS — D509 Iron deficiency anemia, unspecified: Secondary | ICD-10-CM

## 2021-04-26 DIAGNOSIS — K449 Diaphragmatic hernia without obstruction or gangrene: Secondary | ICD-10-CM

## 2021-04-26 LAB — CBC WITH DIFFERENTIAL/PLATELET
Basophils Absolute: 0.1 10*3/uL (ref 0.0–0.1)
Basophils Relative: 0.8 % (ref 0.0–3.0)
Eosinophils Absolute: 0.3 10*3/uL (ref 0.0–0.7)
Eosinophils Relative: 4.2 % (ref 0.0–5.0)
HCT: 37.2 % (ref 36.0–46.0)
Hemoglobin: 12.3 g/dL (ref 12.0–15.0)
Lymphocytes Relative: 14.9 % (ref 12.0–46.0)
Lymphs Abs: 1.1 10*3/uL (ref 0.7–4.0)
MCHC: 33.1 g/dL (ref 30.0–36.0)
MCV: 82.4 fl (ref 78.0–100.0)
Monocytes Absolute: 0.8 10*3/uL (ref 0.1–1.0)
Monocytes Relative: 10.6 % (ref 3.0–12.0)
Neutro Abs: 5.3 10*3/uL (ref 1.4–7.7)
Neutrophils Relative %: 69.5 % (ref 43.0–77.0)
Platelets: 200 10*3/uL (ref 150.0–400.0)
RBC: 4.52 Mil/uL (ref 3.87–5.11)
RDW: 19 % — ABNORMAL HIGH (ref 11.5–15.5)
WBC: 7.7 10*3/uL (ref 4.0–10.5)

## 2021-04-26 LAB — BASIC METABOLIC PANEL
BUN: 20 mg/dL (ref 6–23)
CO2: 28 mEq/L (ref 19–32)
Calcium: 9.3 mg/dL (ref 8.4–10.5)
Chloride: 103 mEq/L (ref 96–112)
Creatinine, Ser: 0.8 mg/dL (ref 0.40–1.20)
GFR: 76.53 mL/min (ref >60.00)
Glucose, Bld: 89 mg/dL (ref 70–99)
Potassium: 4.5 mEq/L (ref 3.5–5.1)
Sodium: 137 mEq/L (ref 135–145)

## 2021-04-26 LAB — BRAIN NATRIURETIC PEPTIDE: Pro B Natriuretic peptide (BNP): 64 pg/mL (ref 0.0–100.0)

## 2021-04-26 NOTE — Patient Instructions (Signed)
-   Labs today - Try to come off of your advair for 2 days before your breathing tests - If you have any questions about your results send me a mychart message or call

## 2021-04-27 LAB — IGE: IgE (Immunoglobulin E), Serum: 1083 kU/L — ABNORMAL HIGH (ref <=114)

## 2021-04-30 ENCOUNTER — Other Ambulatory Visit: Payer: Self-pay | Admitting: Gastroenterology

## 2021-04-30 DIAGNOSIS — D509 Iron deficiency anemia, unspecified: Secondary | ICD-10-CM

## 2021-04-30 NOTE — Progress Notes (Signed)
[name], Your hgb is stable and within normal range at 12.  I would recommend you continue to oral iron once a day for another month to replenish your iron stores and plan to repeat an iron panel in 3 months (May 2023).

## 2021-05-01 ENCOUNTER — Encounter: Payer: Self-pay | Admitting: Student

## 2021-05-02 ENCOUNTER — Telehealth: Payer: Self-pay | Admitting: *Deleted

## 2021-05-02 ENCOUNTER — Other Ambulatory Visit: Payer: Self-pay | Admitting: General Surgery

## 2021-05-02 ENCOUNTER — Telehealth: Payer: Self-pay

## 2021-05-02 DIAGNOSIS — E041 Nontoxic single thyroid nodule: Secondary | ICD-10-CM

## 2021-05-02 NOTE — Telephone Encounter (Signed)
NOTES SCANNED TO REFERRAL 

## 2021-05-02 NOTE — Telephone Encounter (Signed)
? ?  Pre-operative Risk Assessment  ?  ?Patient Name: [name]  ?DOB: August 05, 1955 ?MRN: [id number]  ? ?  ?PT IS BEING REFERRED TO CARDIOLOGY FOR DYSPNEA AND PRE OP CLEARANCE. I WILL SEND A MESSAGE TO OUT CHART PREP TEAM, AS WELL AS SCHEDULING TO REACH OUT TO THE PT WITH A NEW PT APPT. I WILL HAND OFF THESE RECORDS TO OUR CHART PREP TEAM (ROSE JACOBS, CMA) ?Request for Surgical Clearance   ? ?Procedure:   HIATAL HERNIA REPAIR ? ?Date of Surgery:  Clearance TBD                              ?   ?Surgeon:  DR. Gurney Maxin ?Surgeon's Group or Practice Name:  [facility] ?Phone number:  [phone] ?Fax number:  [phone] ATTN: Emeline Gins, [place] ?  ?Type of Clearance Requested:   ?- Medical  ?  ?Type of Anesthesia:  General  ?  ?Additional requests/questions:   ? ?Signed, ?Julaine Hua   ?05/02/2021, 9:59 AM  ? ?

## 2021-05-03 NOTE — Telephone Encounter (Signed)
Pt has appt 05/28/21 with Dr. Buford Dresser as NEW PT for pre op clearance. I will forward clearance notes to MD for upcoming appt. Will send FYI to requesting off the pt has NEW PT app 05/28/21.  ?

## 2021-05-14 ENCOUNTER — Ambulatory Visit: Payer: Medicare PPO | Admitting: Student

## 2021-05-14 ENCOUNTER — Other Ambulatory Visit: Payer: Self-pay

## 2021-05-14 DIAGNOSIS — R0609 Other forms of dyspnea: Secondary | ICD-10-CM

## 2021-05-14 LAB — PULMONARY FUNCTION TEST
DL/VA % pred: 114 %
DL/VA: 4.72 ml/min/mmHg/L
DLCO cor % pred: 118 %
DLCO cor: 24.22 ml/min/mmHg
DLCO unc % pred: 114 %
DLCO unc: 23.36 ml/min/mmHg
FEF 25-75 Post: 1.65 L/sec
FEF 25-75 Pre: 0.93 L/sec
FEF2575-%Change-Post: 77 %
FEF2575-%Pred-Post: 77 %
FEF2575-%Pred-Pre: 43 %
FEV1-%Change-Post: 21 %
FEV1-%Pred-Post: 78 %
FEV1-%Pred-Pre: 64 %
FEV1-Post: 1.94 L
FEV1-Pre: 1.6 L
FEV1FVC-%Change-Post: 11 %
FEV1FVC-%Pred-Pre: 82 %
FEV6-%Change-Post: 9 %
FEV6-%Pred-Post: 86 %
FEV6-%Pred-Pre: 79 %
FEV6-Post: 2.71 L
FEV6-Pre: 2.47 L
FEV6FVC-%Change-Post: 1 %
FEV6FVC-%Pred-Post: 103 %
FEV6FVC-%Pred-Pre: 101 %
FVC-%Change-Post: 8 %
FVC-%Pred-Post: 84 %
FVC-%Pred-Pre: 77 %
FVC-Post: 2.74 L
FVC-Pre: 2.53 L
Post FEV1/FVC ratio: 71 %
Post FEV6/FVC ratio: 99 %
Pre FEV1/FVC ratio: 63 %
Pre FEV6/FVC Ratio: 98 %
RV % pred: 114 %
RV: 2.47 L
TLC % pred: 100 %
TLC: 5.21 L

## 2021-05-14 NOTE — Progress Notes (Signed)
Full PFT completed today ? ?

## 2021-05-19 ENCOUNTER — Telehealth: Payer: Medicare PPO | Admitting: Nurse Practitioner

## 2021-05-19 DIAGNOSIS — U071 COVID-19: Secondary | ICD-10-CM | POA: Diagnosis not present

## 2021-05-19 MED ORDER — MOLNUPIRAVIR EUA 200MG CAPSULE: 4.0000 | ORAL_CAPSULE | Freq: Two times a day (BID) | ORAL | 0 refills | Status: DC

## 2021-05-19 NOTE — Progress Notes (Signed)
?Virtual Visit Consent  ? ?[name], you are scheduled for a virtual visit with a [facility] provider today.   ?  ?Just as with appointments in the office, your consent must be obtained to participate.  Your consent will be active for this visit and any virtual visit you may have with one of our providers in the next 365 days.   ?  ?If you have a MyChart account, a copy of this consent can be sent to you electronically.  All virtual visits are billed to your insurance company just like a traditional visit in the office.   ? ?As this is a virtual visit, video technology does not allow for your provider to perform a traditional examination.  This may limit your provider's ability to fully assess your condition.  If your provider identifies any concerns that need to be evaluated in person or the need to arrange testing (such as labs, EKG, etc.), we will make arrangements to do so.   ?  ?Although advances in technology are sophisticated, we cannot ensure that it will always work on either your end or our end.  If the connection with a video visit is poor, the visit may have to be switched to a telephone visit.  With either a video or telephone visit, we are not always able to ensure that we have a secure connection.    ? ?I need to obtain your verbal consent now.   Are you willing to proceed with your visit today?  ?  ?[name] has provided verbal consent on 05/19/2021 for a virtual visit (video or telephone). ?  ?Christine Schneiders, FNP  ? ?Date: 05/19/2021 8:41 AM ? ? ?Virtual Visit via Video Note  ? ?[name], connected with  [name]  ([id number], 25-Mar-1955) on 05/19/21 at  8:45 AM EDT by a video-enabled telemedicine application and verified that I am speaking with the correct person using two identifiers. ? ?Location: ?Patient: Virtual Visit Location Patient: Home ?Provider: Virtual Visit Location Provider: Home Office ?  ?I discussed the limitations of evaluation and management by telemedicine  and the availability of in person appointments. The patient expressed understanding and agreed to proceed.   ? ?History of Present Illness: ?[name] is a 66 y.o. who identifies as a female who was assigned female at birth, and is being seen today after testing positive for COVID-19. ? ?Her symptoms started last night with a sore throat and a runny nose. She is currently at a friend's home that had COVID.  ? ?She has not had COVID in the past.  ?She has been vaccinated for COVID most recently was in December 2022.  ? ?She is scheduled to have biopsies Monday for growths on her thyroid and a pulmonology appointment next week at [facility].  ? ?She was recently referred to a pulmonologist for SOB for the past 8 months.  ?She has been manages on Advair daily and albuterol  ? ?She has a history of anemia- SOB has persisted beyond iron replacement. She has had an endoscopy and colonoscopy- diagnosed with hiatal hernia and multiple growths on thyroid.  ? ?Awaiting hernia repair as well.  ? ?Problems:  ?Patient Active Problem List  ? Diagnosis Date Noted  ? Iron deficiency anemia secondary to inadequate dietary iron intake 01/23/2021  ? Other fatigue 01/23/2021  ? Shortness of breath 01/23/2021  ? Colon cancer screening 01/23/2021  ? Acute effusion of right ear 01/23/2021  ? Tinnitus of  right ear 01/23/2021  ? Encounter for medical examination to establish care 01/23/2021  ? S/P total knee arthroplasty, right 11/28/2020  ? Hammer toe 10/14/2017  ?  ?Allergies:  ?Allergies  ?Allergen Reactions  ? Mango Flavor [Flavoring Agent] Anaphylaxis  ?  Specifically mango peel , patient can eat the fruit itself  ? Oxycontin [Oxycodone]   ?  Dizziness  ? ?Medications:  ?Current Outpatient Medications:  ?  ADVAIR DISKUS 250-50 MCG/DOSE AEPB, Inhale 2 puffs into the lungs 2 (two) times daily., Disp: , Rfl:  ?  albuterol (VENTOLIN HFA) 108 (90 Base) MCG/ACT inhaler, Inhale 2 puffs into the lungs every 6 (six) hours as needed for wheezing  or shortness of breath., Disp: 1 each, Rfl: 3 ?  Ascorbic Acid (VITAMIN C) 1000 MG tablet, Take 1,000 mg by mouth daily., Disp: , Rfl:  ?  calcium carbonate (TUMS - DOSED IN MG ELEMENTAL CALCIUM) 500 MG chewable tablet, Chew 1,000 mg by mouth daily as needed for indigestion or heartburn., Disp: , Rfl:  ?  celecoxib (CELEBREX) 200 MG capsule, celecoxib 200 mg capsule  Take 1 capsule twice a day by oral route with meals for 30 days., Disp: , Rfl:  ?  cetirizine (ZYRTEC) 10 MG tablet, Take 1 tablet (10 mg total) by mouth 2 (two) times daily. (Patient taking differently: Take 10 mg by mouth daily.), Disp: 60 tablet, Rfl: 3 ?  Cholecalciferol (VITAMIN D) 50 MCG (2000 UT) tablet, Take 1 tablet (2,000 Units total) by mouth 2 (two) times daily., Disp: 60 tablet, Rfl: 3 ?  ferrous sulfate 325 (65 FE) MG EC tablet, Take 2 tablets (650 mg total) by mouth daily. Take with food to avoid stomach upset., Disp: 180 tablet, Rfl: 0 ?  hydrochlorothiazide (HYDRODIURIL) 25 MG tablet, Take 1 tablet (25 mg total) by mouth every morning., Disp: 30 tablet, Rfl: 3 ?  Magnesium 400 MG TABS, Take 400 mg by mouth daily., Disp: 30 tablet, Rfl: 3 ?  omeprazole (PRILOSEC) 20 MG capsule, TAKE ONE CAPSULE BY MOUTH DAILY TAKE 30-45 MINUTES BEFORE BREAKFAST (Patient taking differently: As needed), Disp: 30 capsule, Rfl: 0 ?  sertraline (ZOLOFT) 100 MG tablet, Take 1.5 tablets (150 mg total) by mouth every morning., Disp: 45 tablet, Rfl: 3 ? ?Observations/Objective: ?Patient is well-developed, well-nourished in no acute distress.  ?Resting comfortably at home.  ?Head is normocephalic, atraumatic.  ?No labored breathing.  ?Speech is clear and coherent with logical content.  ?Patient is alert and oriented at baseline.  ? ? ?Assessment and Plan: ?1. COVID-19 ?- molnupiravir EUA (LAGEVRIO) 200 mg CAPS capsule; Take 4 capsules (800 mg total) by mouth 2 (two) times daily for 5 days.  Dispense: 40 capsule; Refill: 0 ?   ?May use OTC Mucinex as  needed ?Discussed hydration and protein intake for recovery  ? ?Continue inhaler use as prescribed  ? ?Follow up with physicians for next week as appointments may need to be rescheduled based on COVID status.  ? ? ?Follow Up Instructions: ?I discussed the assessment and treatment plan with the patient. The patient was provided an opportunity to ask questions and all were answered. The patient agreed with the plan and demonstrated an understanding of the instructions.  A copy of instructions were sent to the patient via MyChart unless otherwise noted below.  ? ? ?The patient was advised to call back or seek an in-person evaluation if the symptoms worsen or if the condition fails to improve as anticipated. ? ?Time:  ?I spent  15 minutes with the patient via telehealth technology discussing the above problems/concerns.   ? ?Christine Schneiders, FNP  ?

## 2021-05-20 ENCOUNTER — Other Ambulatory Visit: Payer: Self-pay | Admitting: Gastroenterology

## 2021-05-21 ENCOUNTER — Inpatient Hospital Stay: Admission: RE | Admit: 2021-05-21 | Payer: Medicare PPO | Source: Ambulatory Visit

## 2021-05-23 NOTE — Progress Notes (Signed)
? ?Synopsis: Referred for dyspnea by Early, Coralee Pesa, NP ? ?Virtual Visit via Video Note ?  ?I connected with Ms. Speights today by a video enabled telemedicine application and verified that I am speaking with the correct person using two identifiers. ?  ?Location: ?Patient: Home ?Provider: Office ?  ?I discussed the limitations of evaluation and management by telemedicine and the availability of in person appointments. The patient expressed understanding and agreed to proceed. ? ? ?Subjective:  ? ?PATIENT ID: [name] GENDER: female DOB: 09-10-1955, MRN: [id number] ? ?Chief Complaint  ?Patient presents with  ? Follow-up  ?  Dx with Covid 19 05/19/21- tx with molnupiravir. She states she had body aches but this has improved some. She had some cough with yellow sputum yesterday. Breathing is overall doing well.   ? ?66yF with history of goiter, asthma on advair, HTN, large hiatal hernia referred for shortness of breath.  ? ?Never did get covid-19. She is vaccinated and boosted for covid-19.  ? ?She loves to garden, swim but is now very limited by her dyspnea. Has trouble even walking her dogs down the street. This has been essentially since June that she's noticed a decline. She has no orthopnea. She is unsure if she has worsening dyspnea walking into a pool with water up to her shoulders. She has no cough.  ? ?She has tried increasing dose of advair from 1 puff BID to 2 puffs BID with no improvement. To her this does not feel like asthma.  ? ?Not episodic. Feels this really whenever she tries to exert herself.  ? ?Last year had left hip and right knee replaced.  ? ?She has no family history of lung disease other than grandmother who had emphysema ? ?Smoked for 4 years in distant past. MJ in distant past. No vaping. 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  ? ?Interval HPI: ?Very elevated IgE and PFTs supportive of asthma. No change in DOE relative to last visit or  increase in cough apart from her recent course with covid-19. PCP prescribed molnupravir and she is still isolating at home. Increased postnasal drainage, taking mucinex which is helpful. ? ?Otherwise pertinent review of systems is negative. ? ? ?Past Medical History:  ?Diagnosis Date  ? Arthritis   ? Asthma   ? Depression   ? Goiter   ? Hypertension   ?  ? ?Family History  ?Problem Relation Age of Onset  ? Heart disease Mother   ? Breast cancer Maternal Grandmother   ? Emphysema Paternal Grandmother   ?     never smoked but chewed tobacco  ?  ? ?Past Surgical History:  ?Procedure Laterality Date  ? CHOLECYSTECTOMY    ? FOOT SURGERY    ? KNEE SURGERY Left   ? TKA x2    1974, 2014  ? SHOULDER SURGERY    ? TOTAL HIP ARTHROPLASTY Left   ? TOTAL KNEE ARTHROPLASTY Right 11/28/2020  ? Procedure: TOTAL KNEE ARTHROPLASTY;  Surgeon: Paralee Cancel, MD;  Location: [facility];  Service: Orthopedics;  Laterality: Right;  ? ? ?Social History  ? ?Socioeconomic History  ? Marital status: Married  ?  Spouse name: Not on file  ? Number of children: Not on file  ? Years of education: Not on file  ? Highest education level: Not on file  ?Occupational History  ? Not on file  ?Tobacco Use  ? Smoking status: Former  ?  Packs/day: 1.00  ?  Years: 4.00  ?  Pack years: 4.00  ?  Types: Cigarettes  ?  Quit date: 13  ?  Years since quitting: 49.2  ? Smokeless tobacco: Never  ?Vaping Use  ? Vaping Use: Never used  ?Substance and Sexual Activity  ? Alcohol use: Yes  ?  Comment: Wine occasional  ? Drug use: No  ? Sexual activity: Not on file  ?Other Topics Concern  ? Not on file  ?Social History Narrative  ? Not on file  ? ?Social Determinants of Health  ? ?Financial Resource Strain: Not on file  ?Food Insecurity: Not on file  ?Transportation Needs: Not on file  ?Physical Activity: Not on file  ?Stress: Not on file  ?Social Connections: Not on file  ?Intimate Partner Violence: Not on file  ?  ? ?Allergies  ?Allergen Reactions  ? Mango Flavor  [Flavoring Agent] Anaphylaxis  ?  Specifically mango peel , patient can eat the fruit itself  ? Oxycontin [Oxycodone]   ?  Dizziness  ?  ? ?Outpatient Medications Prior to Visit  ?Medication Sig Dispense Refill  ? albuterol (VENTOLIN HFA) 108 (90 Base) MCG/ACT inhaler Inhale 2 puffs into the lungs every 6 (six) hours as needed for wheezing or shortness of breath. 1 each 3  ? Ascorbic Acid (VITAMIN C) 1000 MG tablet Take 1,000 mg by mouth daily.    ? calcium carbonate (TUMS - DOSED IN MG ELEMENTAL CALCIUM) 500 MG chewable tablet Chew 1,000 mg by mouth daily as needed for indigestion or heartburn.    ? celecoxib (CELEBREX) 200 MG capsule celecoxib 200 mg capsule ? Take 1 capsule twice a day by oral route with meals for 30 days.    ? cetirizine (ZYRTEC) 10 MG tablet Take 1 tablet (10 mg total) by mouth 2 (two) times daily. (Patient taking differently: Take 10 mg by mouth daily.) 60 tablet 3  ? Cholecalciferol (VITAMIN D) 50 MCG (2000 UT) tablet Take 1 tablet (2,000 Units total) by mouth 2 (two) times daily. 60 tablet 3  ? ferrous sulfate 325 (65 FE) MG EC tablet Take 2 tablets (650 mg total) by mouth daily. Take with food to avoid stomach upset. 180 tablet 0  ? hydrochlorothiazide (HYDRODIURIL) 25 MG tablet Take 1 tablet (25 mg total) by mouth every morning. 30 tablet 3  ? Magnesium 400 MG TABS Take 400 mg by mouth daily. 30 tablet 3  ? omeprazole (PRILOSEC) 20 MG capsule TAKE ONE CAPSULE BY MOUTH DAILY 30 - 45 MINUTES BEFORE BREAKFAST 30 capsule 0  ? sertraline (ZOLOFT) 100 MG tablet Take 1.5 tablets (150 mg total) by mouth every morning. 45 tablet 3  ? ADVAIR DISKUS 250-50 MCG/DOSE AEPB Inhale 2 puffs into the lungs 2 (two) times daily.    ? molnupiravir EUA (LAGEVRIO) 200 mg CAPS capsule Take 4 capsules (800 mg total) by mouth 2 (two) times daily for 5 days. 40 capsule 0  ? ?No facility-administered medications prior to visit.  ? ? ? ? ? ?Objective:  ? ?Physical Exam: ? ?General appearance: 66 y.o., female, female, NAD,  conversant  ?Eyes: PERRL, tracking appropriately ?Lungs: clear strong voice, with normal respiratory effort ?Psych: Appropriate affect ?Neuro: attentive, grossly nonfocal ? ? ? ?There were no vitals filed for this visit. ? ?  on RA ?BMI Readings from Last 3 Encounters:  ?04/26/21 33.45 kg/m?  ?04/04/21 35.58 kg/m?  ?03/26/21 35.57 kg/m?  ? ?Wt Readings from Last 3 Encounters:  ?04/26/21 201 lb (  91.2 kg)  ?04/04/21 207 lb 4.8 oz (94 kg)  ?03/26/21 207 lb 3.2 oz (94 kg)  ? ? ? ?CBC ?   ?Component Value Date/Time  ? WBC 7.7 04/26/2021 1601  ? RBC 4.52 04/26/2021 1601  ? HGB 12.3 04/26/2021 1601  ? HGB 12.8 03/26/2021 1114  ? HGB 9.2 (L) 01/23/2021 0945  ? HCT 37.2 04/26/2021 1601  ? HCT 30.8 (L) 01/23/2021 0945  ? PLT 200.0 04/26/2021 1601  ? PLT 232 03/26/2021 1114  ? PLT 313 01/23/2021 0945  ? MCV 82.4 04/26/2021 1601  ? MCV 77 (L) 01/23/2021 0945  ? MCH 25.9 (L) 03/26/2021 1114  ? MCHC 33.1 04/26/2021 1601  ? RDW 19.0 (H) 04/26/2021 1601  ? RDW 15.2 01/23/2021 0945  ? LYMPHSABS 1.1 04/26/2021 1601  ? LYMPHSABS 1.2 01/23/2021 0945  ? MONOABS 0.8 04/26/2021 1601  ? EOSABS 0.3 04/26/2021 1601  ? EOSABS 0.4 01/23/2021 0945  ? BASOSABS 0.1 04/26/2021 1601  ? BASOSABS 0.1 01/23/2021 0945  ? ? ?Eos 200-400 ? ?IgE 1000 ? ?Chest Imaging: ?CT Chest 04/06/21 reviewed by me remarkable for large hiatal hernia, a little scar in RML, lingula, bases ? ?Pulmonary Functions Testing Results: ? ?  Latest Ref Rng & Units 05/14/2021  ? 12:50 PM  ?PFT Results  ?FVC-Pre L 2.53  P  ?FVC-Predicted Pre % 77  P  ?FVC-Post L 2.74  P  ?FVC-Predicted Post % 84  P  ?Pre FEV1/FVC % % 63  P  ?Post FEV1/FCV % % 71  P  ?FEV1-Pre L 1.60  P  ?FEV1-Predicted Pre % 64  P  ?FEV1-Post L 1.94  P  ?DLCO uncorrected ml/min/mmHg 23.36  P  ?DLCO UNC% % 114  P  ?DLCO corrected ml/min/mmHg 24.22  P  ?DLCO COR %Predicted % 118  P  ?DLVA Predicted % 114  P  ?TLC L 5.21  P  ?TLC % Predicted % 100  P  ?RV % Predicted % 114  P  ?  ?P Preliminary result  ? ?Mild  obstruction by post BD FEV1, ++BD response, borderline elevated DLCO ? ? ?Echocardiogram:  ?None ? ? ?   ?Assessment & Plan:  ? ?# Shortness of breath ?# Moderate persistent asthma ?I think we can first try to see if we c

## 2021-05-24 ENCOUNTER — Ambulatory Visit: Payer: Medicare PPO | Admitting: Student

## 2021-05-24 ENCOUNTER — Telehealth: Payer: Medicare PPO | Admitting: Student

## 2021-05-24 DIAGNOSIS — J4540 Moderate persistent asthma, uncomplicated: Secondary | ICD-10-CM

## 2021-05-24 DIAGNOSIS — R768 Other specified abnormal immunological findings in serum: Secondary | ICD-10-CM | POA: Diagnosis not present

## 2021-05-24 DIAGNOSIS — R0609 Other forms of dyspnea: Secondary | ICD-10-CM | POA: Diagnosis not present

## 2021-05-24 MED ORDER — FLUTICASONE PROPIONATE 50 MCG/ACT NA SUSP: 1.0000 | Freq: Every day | NASAL | 6 refills | Status: DC

## 2021-05-24 MED ORDER — BREZTRI AEROSPHERE 160-9-4.8 MCG/ACT IN AERO: 2.0000 | INHALATION_SPRAY | Freq: Two times a day (BID) | RESPIRATORY_TRACT | 0 refills | Status: DC

## 2021-05-24 MED ORDER — BREZTRI AEROSPHERE 160-9-4.8 MCG/ACT IN AERO: 2.0000 | INHALATION_SPRAY | Freq: Two times a day (BID) | RESPIRATORY_TRACT | 11 refills | Status: DC

## 2021-05-28 ENCOUNTER — Ambulatory Visit (HOSPITAL_BASED_OUTPATIENT_CLINIC_OR_DEPARTMENT_OTHER): Payer: Medicare PPO | Admitting: Cardiology

## 2021-06-04 ENCOUNTER — Other Ambulatory Visit (HOSPITAL_COMMUNITY)
Admission: RE | Admit: 2021-06-04 | Discharge: 2021-06-04 | Disposition: A | Discharge status: Home / Self Care | Payer: Medicare PPO | Source: Ambulatory Visit | Attending: Interventional Radiology | Admitting: Interventional Radiology

## 2021-06-04 ENCOUNTER — Ambulatory Visit
Admission: RE | Admit: 2021-06-04 | Discharge: 2021-06-04 | Disposition: A | Discharge status: Home / Self Care | Payer: Medicare PPO | Source: Ambulatory Visit | Attending: General Surgery | Admitting: General Surgery

## 2021-06-04 DIAGNOSIS — E041 Nontoxic single thyroid nodule: Secondary | ICD-10-CM

## 2021-06-04 DIAGNOSIS — E042 Nontoxic multinodular goiter: Secondary | ICD-10-CM | POA: Diagnosis not present

## 2021-06-04 DIAGNOSIS — D440 Neoplasm of uncertain behavior of thyroid gland: Secondary | ICD-10-CM | POA: Diagnosis not present

## 2021-06-05 LAB — CYTOLOGY - NON PAP

## 2021-06-06 LAB — CYTOLOGY - NON PAP

## 2021-06-06 NOTE — Progress Notes (Signed)
? ? ?Name: [name]  ?MRN/ DOB: [id number], 1955/04/12    ?Age/ Sex: 66 y.o., female   ? ?PCP: Orma Render, NP   ?Reason for Endocrinology Evaluation: MNG  ?   ?Date of Initial Endocrinology Evaluation: 06/07/2021   ? ? ?HPI: ?[name] is a 66 y.o. female with a past medical history of Asthma, MNG. The patient presented for initial endocrinology clinic visit on 06/07/2021 for consultative assistance with her MNG.  ? ? ?During evaluation for chronic anemia she was seen by GI and was noted to have a hiatal hernia.  A CT scan was ordered for further evaluation of hiatal hernia and the patient had an incidental 2.2 cm left thyroid nodule, which prompted a thyroid ultrasound showing multinodular goiter. ? ?Per patient she has been diagnosed with multinodular goiter in 2002 ?She was on LT-for replacement for many years before this was stopped by a physician ? ? ? ?She is S/P FNA of the right mid 4.5 cm nodule with a cytology report consistent with suspicious for malignancy ( [facility] Category V) . She is also S/P benign FNA of the rleft 2.9 cm nodule in  06/2021  ? ? ?She denies local neck symptoms  ?She has lost weight over the past year  ?Denies palpitations - has pending cardiology appointment  ?She has fatigue  ?Denies loose stools or diarrhea  ?Denies palpitations  ? ?Denies Biotin intake  ? ?Mother with thyroid disease  ? ? ? ? ? ?HISTORY:  ?Past Medical History:  ?Past Medical History:  ?Diagnosis Date  ? Arthritis   ? Asthma   ? Depression   ? Goiter   ? Hypertension   ? ?Past Surgical History:  ?Past Surgical History:  ?Procedure Laterality Date  ? CHOLECYSTECTOMY    ? FOOT SURGERY    ? KNEE SURGERY Left   ? TKA x2    1974, 2014  ? SHOULDER SURGERY    ? TOTAL HIP ARTHROPLASTY Left   ? TOTAL KNEE ARTHROPLASTY Right 11/28/2020  ? Procedure: TOTAL KNEE ARTHROPLASTY;  Surgeon: Paralee Cancel, MD;  Location: [facility];  Service: Orthopedics;  Laterality: Right;  ?  ?Social History:  reports that she quit  smoking about 49 years ago. Her smoking use included cigarettes. She has a 4.00 pack-year smoking history. She has never used smokeless tobacco. She reports current alcohol use. She reports that she does not use drugs. ?Family History: family history includes Breast cancer in her maternal grandmother; Emphysema in her paternal grandmother; Heart disease in her mother. ? ? ?HOME MEDICATIONS: ?Allergies as of 06/07/2021   ? ?   Reactions  ? Mango Flavor [flavoring Agent] Anaphylaxis  ? Specifically mango peel , patient can eat the fruit itself  ? Oxycontin [oxycodone]   ? Dizziness  ? ?  ? ?  ?Medication List  ?  ? ?  ? Accurate as of June 07, 2021  4:52 PM. If you have any questions, ask your nurse or doctor.  ?  ?  ? ?  ? ?STOP taking these medications   ? ?calcium carbonate 1250 (500 Ca) MG chewable tablet ?Commonly known as: OS-CAL ?Stopped by: Dorita Sciara, MD ?  ? ?  ? ?TAKE these medications   ? ?albuterol 108 (90 Base) MCG/ACT inhaler ?Commonly known as: VENTOLIN HFA ?Inhale 2 puffs into the lungs every 6 (six) hours as needed for wheezing or shortness of breath. ?  ?Breztri Aerosphere 160-9-4.8 MCG/ACT Aero ?Generic drug:  Budeson-Glycopyrrol-Formoterol ?Inhale 2 puffs into the lungs in the morning and at bedtime. ?What changed: Another medication with the same name was removed. Continue taking this medication, and follow the directions you see here. ?Changed by: Dorita Sciara, MD ?  ?calcium carbonate 500 MG chewable tablet ?Commonly known as: TUMS - dosed in mg elemental calcium ?Chew 1,000 mg by mouth daily as needed for indigestion or heartburn. ?  ?celecoxib 200 MG capsule ?Commonly known as: CELEBREX ?celecoxib 200 mg capsule ? Take 1 capsule twice a day by oral route with meals for 30 days. ?  ?cetirizine 10 MG tablet ?Commonly known as: ZYRTEC ?Take 1 tablet (10 mg total) by mouth 2 (two) times daily. ?What changed: when to take this ?  ?ferrous sulfate 325 (65 FE) MG EC tablet ?Take 2  tablets (650 mg total) by mouth daily. Take with food to avoid stomach upset. ?  ?fluticasone 50 MCG/ACT nasal spray ?Commonly known as: FLONASE ?Place 1 spray into both nostrils daily. ?  ?hydrochlorothiazide 25 MG tablet ?Commonly known as: HYDRODIURIL ?Take 1 tablet (25 mg total) by mouth every morning. ?  ?Magnesium 400 MG Tabs ?Take 400 mg by mouth daily. ?  ?magnesium oxide 400 MG tablet ?Commonly known as: MAG-OX ?Take 400 mg by mouth daily at 6 (six) AM. ?  ?omeprazole 20 MG capsule ?Commonly known as: PRILOSEC ?TAKE ONE CAPSULE BY MOUTH DAILY 30 - [place] ?  ?ondansetron 4 MG disintegrating tablet ?Commonly known as: ZOFRAN-ODT ?Take 4 mg by mouth as needed. ?  ?sertraline 100 MG tablet ?Commonly known as: ZOLOFT ?Take 1.5 tablets (150 mg total) by mouth every morning. ?  ?vitamin C 1000 MG tablet ?Take 1,000 mg by mouth daily. ?  ?Vitamin D 50 MCG (2000 UT) tablet ?Take 1 tablet (2,000 Units total) by mouth 2 (two) times daily. ?  ? ?  ?  ? ? ?REVIEW OF SYSTEMS: ?A comprehensive ROS was conducted with the patient and is negative except as per HPI  ? ? ?OBJECTIVE:  ?VS: BP 128/70 (BP Location: Left Arm, Patient Position: Sitting, Cuff Size: Large)   Pulse 65   Ht '5\' 5"'$  (1.651 m)   Wt 203 lb (92.1 kg)   LMP 08/08/2010   SpO2 95%   BMI 33.78 kg/m?   ? ?Wt Readings from Last 3 Encounters:  ?06/07/21 203 lb (92.1 kg)  ?04/26/21 201 lb (91.2 kg)  ?04/04/21 207 lb 4.8 oz (94 kg)  ? ? ? ?EXAM: ?General: Pt appears well and is in NAD  ?Eyes: External eye exam normal without stare, lid lag or exophthalmos.  EOM intact.    ?Neck: General: Supple without adenopathy. ?Thyroid: Prominent thyroid gland, left thyroid nodule palpated, but I was only able to palpate asymmetry on the right   ?Lungs: Clear with good BS bilat with no rales, rhonchi, or wheezes  ?Heart: Auscultation: RRR.  ?Abdomen: Normoactive bowel sounds, soft, nontender, without masses or organomegaly palpable  ?Extremities:  ?BL  LE: No pretibial edema normal ROM and strength.  ?Mental Status: Judgment, insight: Intact ?Orientation: Oriented to time, place, and person ?Mood and affect: No depression, anxiety, or agitation  ? ? ? ?DATA REVIEWED: ? Latest Reference Range & Units 06/07/21 15:23  ?TSH 0.40 - 4.50 mIU/L 0.64  ?Triiodothyronine (T3) 76 - 181 ng/dL 106  ?T4,Free(Direct) 0.8 - 1.8 ng/dL 0.9  ? ?  ? ?Thyroid ultrasound 04/16/2021 ? ?_________________________________________________________ ?  ?Estimated total number of nodules >/= 1 cm: 4 ?  ?  Number of spongiform nodules >/=  2 cm not described below (TR1): 0 ?  ?Number of mixed cystic and solid nodules >/= 1.5 cm not described ?below ([place]): 0 ?  ?_________________________________________________________ ?  ?Nodule # 1: ?  ?Location: Right; Mid ?  ?Maximum size: 4.5 cm; Other 2 dimensions: 3.2 x 2.1 cm ?  ?Composition: solid/almost completely solid (2) ?  ?Echogenicity: hypoechoic (2) ?  ?Shape: not taller-than-wide (0) ?  ?Margins: smooth (0) ?  ?Echogenic foci: none (0) ?  ?ACR TI-RADS total points: 4. ?  ?ACR TI-RADS risk category: TR4 (4-6 points). ?  ?ACR TI-RADS recommendations: ?  ?**Given size (>/= 1.5 cm) and appearance, fine needle aspiration of ?this moderately suspicious nodule should be considered based on ?TI-RADS criteria. ?  ?_________________________________________________________ ?  ?Nodule # 2: ?  ?Location: Right; Mid ?  ?Maximum size: 1.3 cm; Other 2 dimensions: 1.2 x 0.9 cm ?  ?Composition: mixed cystic and solid (1) ?  ?Echogenicity: isoechoic (1) ?  ?Shape: not taller-than-wide (0) ?  ?Margins: ill-defined (0) ?  ?Echogenic foci: punctate echogenic foci (3) ?  ?ACR TI-RADS total points: 5. ?  ?ACR TI-RADS risk category: TR4 (4-6 points). ?  ?ACR TI-RADS recommendations: ?  ?*Given size (>/= 1 - 1.4 cm) and appearance, a follow-up ultrasound ?in 1 year should be considered based on TI-RADS criteria. ?   ?_________________________________________________________ ?  ?Nodule # 4: ?  ?Location: Left; Mid ?  ?Maximum size: 2.9 cm; Other 2 dimensions: 2.2 x 2.2 cm ?  ?Composition: solid/almost completely solid (2) ?  ?Echogenicity: isoechoic (1) ?  ?Shape: not taller-than-wide

## 2021-06-07 ENCOUNTER — Ambulatory Visit: Payer: Medicare PPO | Admitting: Internal Medicine

## 2021-06-07 ENCOUNTER — Encounter: Payer: Self-pay | Admitting: Internal Medicine

## 2021-06-07 VITALS — BP 128/70 | HR 65 | Ht 65.0 in | Wt 203.0 lb

## 2021-06-07 DIAGNOSIS — D440 Neoplasm of uncertain behavior of thyroid gland: Secondary | ICD-10-CM | POA: Diagnosis not present

## 2021-06-08 LAB — T3: T3, Total: 106 ng/dL (ref 76–181)

## 2021-06-08 LAB — TSH: TSH: 0.64 mIU/L (ref 0.40–4.50)

## 2021-06-08 LAB — T4, FREE: Free T4: 0.9 ng/dL (ref 0.8–1.8)

## 2021-06-10 ENCOUNTER — Emergency Department (HOSPITAL_COMMUNITY)
Admission: EM | Admit: 2021-06-10 | Discharge: 2021-06-10 | Disposition: A | Discharge status: Home / Self Care | Payer: Medicare PPO | Attending: Emergency Medicine | Admitting: Emergency Medicine

## 2021-06-10 ENCOUNTER — Other Ambulatory Visit: Payer: Self-pay

## 2021-06-10 ENCOUNTER — Encounter (HOSPITAL_COMMUNITY): Payer: Self-pay | Admitting: Emergency Medicine

## 2021-06-10 DIAGNOSIS — H538 Other visual disturbances: Secondary | ICD-10-CM | POA: Insufficient documentation

## 2021-06-10 DIAGNOSIS — I10 Essential (primary) hypertension: Secondary | ICD-10-CM | POA: Diagnosis not present

## 2021-06-10 DIAGNOSIS — H43391 Other vitreous opacities, right eye: Secondary | ICD-10-CM | POA: Insufficient documentation

## 2021-06-10 DIAGNOSIS — C73 Malignant neoplasm of thyroid gland: Secondary | ICD-10-CM | POA: Insufficient documentation

## 2021-06-10 DIAGNOSIS — Z79899 Other long term (current) drug therapy: Secondary | ICD-10-CM | POA: Diagnosis not present

## 2021-06-10 DIAGNOSIS — H43392 Other vitreous opacities, left eye: Secondary | ICD-10-CM

## 2021-06-10 NOTE — Discharge Instructions (Addendum)
We saw in the ER for eye floaters.  No evidence of retinal detachment, but we think that you might have some vitreous detachment or hemorrhage in the back of your eye. ? ?We spoke with ophthalmologist, who wants you to call them first thing in the morning tomorrow at 8 AM so that they can work you in for a comprehensive eye evaluation tomorrow morning itself. ?

## 2021-06-10 NOTE — ED Triage Notes (Signed)
Pt states she saw a flash of light with R eye around 10am and has had floaters and blurred vision since then.  Denies pain. ?

## 2021-06-10 NOTE — ED Provider Notes (Signed)
?[facility] ?Provider Note ? ? ?CSN: [id number] ?Arrival date & time: 06/10/21  1604 ? ?  ? ?History ? ?No chief complaint on file. ? ? ?[name] is a 66 y.o. female with PMH significant for hypertension HCTZ and thyroid malignancy with surgical resection scheduled for 07/05/2021 presents to the ED for evaluation of sudden onset of visual changes in the right eye.  Patient states that approximately 5 hours prior to arrival, she stood straight up, saw a bright flash of light in her right eye followed by floaters and blurred vision of the same eye which has persisted since onset.  She denies pain in her eye.  No previous history of stroke.  She denies headache, chest pain, shortness of breath, slurred speech, ataxia, unilateral weakness.  She has no other complaints. ? ?HPI ? ?  ? ?Home Medications ?Prior to Admission medications   ?Medication Sig Start Date End Date Taking? Authorizing Provider  ?albuterol (VENTOLIN HFA) 108 (90 Base) MCG/ACT inhaler Inhale 2 puffs into the lungs every 6 (six) hours as needed for wheezing or shortness of breath. 01/23/21   Orma Render, NP  ?Ascorbic Acid (VITAMIN C) 1000 MG tablet Take 1,000 mg by mouth daily.    [provider]  ?Budeson-Glycopyrrol-Formoterol (BREZTRI AEROSPHERE) 160-9-4.8 MCG/ACT AERO Inhale 2 puffs into the lungs in the morning and at bedtime. 05/24/21   Maryjane Hurter, MD  ?calcium carbonate (TUMS - DOSED IN MG ELEMENTAL CALCIUM) 500 MG chewable tablet Chew 1,000 mg by mouth daily as needed for indigestion or heartburn.    [provider]  ?celecoxib (CELEBREX) 200 MG capsule celecoxib 200 mg capsule ? Take 1 capsule twice a day by oral route with meals for 30 days.    [provider]  ?cetirizine (ZYRTEC) 10 MG tablet Take 1 tablet (10 mg total) by mouth 2 (two) times daily. ?Patient taking differently: Take 10 mg by mouth daily. 01/23/21   Orma Render, NP  ?Cholecalciferol (VITAMIN D)  50 MCG (2000 UT) tablet Take 1 tablet (2,000 Units total) by mouth 2 (two) times daily. 01/23/21   Orma Render, NP  ?ferrous sulfate 325 (65 FE) MG EC tablet Take 2 tablets (650 mg total) by mouth daily. Take with food to avoid stomach upset. 03/30/21   Orma Render, NP  ?fluticasone (FLONASE) 50 MCG/ACT nasal spray Place 1 spray into both nostrils daily. 05/24/21   Maryjane Hurter, MD  ?hydrochlorothiazide (HYDRODIURIL) 25 MG tablet Take 1 tablet (25 mg total) by mouth every morning. 01/23/21   Orma Render, NP  ?Magnesium 400 MG TABS Take 400 mg by mouth daily. 01/23/21   Orma Render, NP  ?magnesium oxide (MAG-OX) 400 MG tablet Take 400 mg by mouth daily at 6 (six) AM. 01/23/21   [provider]  ?omeprazole (PRILOSEC) 20 MG capsule TAKE ONE CAPSULE BY MOUTH DAILY 30 - 45 MINUTES BEFORE BREAKFAST 05/21/21   Daryel November, MD  ?ondansetron (ZOFRAN-ODT) 4 MG disintegrating tablet Take 4 mg by mouth as needed. 12/25/20   [provider]  ?sertraline (ZOLOFT) 100 MG tablet Take 1.5 tablets (150 mg total) by mouth every morning. 01/23/21   Orma Render, NP  ?   ? ?Allergies    ?Mango flavor [flavoring agent] and Oxycontin [oxycodone]   ? ?Review of Systems   ?Review of Systems ? ?Physical Exam ?Updated Vital Signs ?BP (!) 138/58 (BP Location: Right Arm)   Pulse  72   Temp 97.8 ?F (36.6 ?C) (Oral)   Resp 18   LMP 08/08/2010   SpO2 99%  ?Physical Exam ?Vitals and nursing note reviewed.  ?Constitutional:   ?   General: She is not in acute distress. ?   Appearance: She is not ill-appearing.  ?HENT:  ?   Head: Atraumatic.  ?Eyes:  ?   General: Lids are normal.  ?   Extraocular Movements: Extraocular movements intact.  ?   Conjunctiva/sclera: Conjunctivae normal.  ?   Pupils: Pupils are equal, round, and reactive to light.  ?Cardiovascular:  ?   Rate and Rhythm: Normal rate and regular rhythm.  ?   Pulses: Normal pulses.  ?   Heart sounds: No murmur heard. ?Pulmonary:  ?   Effort: Pulmonary  effort is normal. No respiratory distress.  ?   Breath sounds: Normal breath sounds.  ?Abdominal:  ?   General: Abdomen is flat. There is no distension.  ?   Palpations: Abdomen is soft.  ?   Tenderness: There is no abdominal tenderness.  ?Musculoskeletal:     ?   General: Normal range of motion.  ?   Cervical back: Normal range of motion.  ?Skin: ?   General: Skin is warm and dry.  ?   Capillary Refill: Capillary refill takes less than 2 seconds.  ?Neurological:  ?   General: No focal deficit present.  ?   Mental Status: She is alert.  ?   Comments: Speech is clear, able to follow commands ?CN III-XII intact ?Normal strength in upper and lower extremities bilaterally including dorsiflexion and plantar flexion, strong and equal grip strength ?Sensation normal to light and sharp touch ?Moves extremities without ataxia, coordination intact ?Normal finger to nose and rapid alternating movements ?No pronator drift ? ?  ?Psychiatric:     ?   Mood and Affect: Mood normal.  ? ? ?ED Results / Procedures / Treatments   ?Labs ?(all labs ordered are listed, but only abnormal results are displayed) ?Labs Reviewed - No data to display ? ?EKG ?None ? ?Radiology ?No results found. ? ?Procedures ?Procedures  ? ? ?Medications Ordered in ED ?Medications - No data to display ? ?ED Course/ Medical Decision Making/ A&P ?  ?                        ?Medical Decision Making ? ?History:  ?Per HPI ?Social determinants of health: none ? ?Initial impression: ? ?This patient presents to the ED for concern of unilateral vision changes, this involves an extensive number of treatment options, and is a complaint that carries with it a high risk of complications and morbidity.    ? ? ?Consultations Obtained: ? ?I requested consultation with Dr. Manuella Ghazi, my Dr. Kathrynn Humble spoke with him about recommendations. ? ? ?ED Course: ?66 year old female presents to the ED for sudden onset right eye painless vision changes.  Vitals in triage indicate blood pressure  of 162/76 which resolved to 124/69 without intervention by the time that I evaluated her. Physical exam benign without any focal deficits.  Concern for retinal detachment versus vitreous hemorrhage.  My attending, Dr. Kathrynn Humble, assisted with ocular ultrasound which was without evidence of retinal detachment, however possible vitreous fluid collection in the posterior eye.  Dr. Kathrynn Humble consulted with ophthalmologist on-call who recommends patient be discharged home and she can call clinic opens tomorrow morning for close follow-up. ? ?Disposition: ? ?After consideration of the diagnostic results, physical  exam, history and the patients response to treatment feel that the patent would benefit from discharge .   ?Visual floaters, left: Plan of management as described above.  Return precautions.  All questions asked and answered.  Patient expresses understanding of plan and is discharged home in good condition. ? ?Final Clinical Impression(s) / ED Diagnoses ?Final diagnoses:  ?Visual floaters, left  ? ? ?Rx / DC Orders ?ED Discharge Orders   ? ? None  ? ?  ? ? ?  ?Tonye Pearson, [place] ?06/10/21 2110 ? ?  ?Varney Biles, MD ?06/13/21 0023 ? ?

## 2021-06-11 DIAGNOSIS — H43811 Vitreous degeneration, right eye: Secondary | ICD-10-CM | POA: Diagnosis not present

## 2021-06-15 ENCOUNTER — Ambulatory Visit: Payer: Self-pay | Admitting: General Surgery

## 2021-06-19 NOTE — Progress Notes (Signed)
?Cardiology Office Note:   ? ?Date:  06/21/2021  ? ?ID:  [name], DOB Mar 03, 1956, MRN [id number] ? ?PCP:  Orma Render, NP  ?Cardiologist:  Buford Dresser, MD ? ?Referring MD: Mickeal Skinner, *  ? ?Chief Complaint  ?Patient presents with  ? Shortness of Breath  ? ? ?History of Present Illness:   ? ?[name] is a 66 y.o. female with a hx of hypertension, asthma, and goiter who is seen as a new consult at the request of [name], [name], * for the evaluation and management of dyspnea and pre-operative clearance. ? ?She saw Dr. Kieth Brightly 04/25/21 for worsening constant fatigue and dyspnea lasting 6 months. She was found to be anemic and underwent EGD and colonoscopy which revealed a large paraesophageal hernia. Dr. Kieth Brightly did not believe the dyspnea was related to the hernia and she was referred to cardiology. She was scheduled for hiatal hernia repair. She was also found to have a 2.2 cm L thyroid nodule and scheduled for total thyroidectomy 5/4.  ? ?She saw Dr. Verlee Monte 2/23 and reported the symptoms did not feel like asthma and did not improve with increased advair. He believed the shortness of breath was not lung-related and recommended a stress test or echocardiogram with cardiology visit for possible CAD.  ? ?Today:  ?She is doing well. Her shortness of breath started 09/2020. She was attending swimming lessons at [facility] and noticed she had difficulty holding her breath underwater. She underwent R knee replacement surgery 11/2020 and her shortness of breath worsened. She was treated for anemia and notice some improvement with her shortness of breath but it persisted. Upon further testing for her anemia, hiatal hernia and 5 thyroid nodules were revealed. She denies bleeding complications. Her asthma medications were changed and she noticed improvement with her shortness of breath. She does not believe her symptoms are related to allergies. She denies chest pain, PND, orthopnea, LE  edema or unexpected weight gain. No syncope or palpitations.  ? ?She raises purebred havanese dogs and stays active by gardening. Since June, she notices difficulty walking her dogs or upstairs due to her shortness of breath and recovering from knee replacement. She has undergone several joint replacement surgeries with no complications. She denies diabetes, kidney disease, or sleep apnea. She has been on HCTZ for over 20 years. ? ?Her father had an MI and her mother had CHF. Her father was also an alcoholic, smoked, weighed over 300 lbs, and ate a primarily fried diet. She has been told she does not have CHF. 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  ? ?Planned surgery: Hiatal Hernia repair and total thyroidectomy ?Pertinent past cardiac history: none ?Prior cardiac workup: none ?History of valve disease: none ?History of CAD/PAD/CVA/TIA: none ?History of heart failure: none ?History of arrhythmia: none ?On anticoagulation: no ?History of hypertension: She has been on HCTZ for over 20 years ?History of diabetes: none ?History of CKD: none ?History of OSA: none ?History of anesthesia complications: none; underwent joint replacement surgeries with no complications  ?Current symptoms: shortness of breath ?Functional capacity: Difficulty walking upstairs or with her dogs ?Metabolic syndrome/Obesity: BMI 34.23 ?Chronic inflammatory conditions: none ?Tobacco use history: Former (quit [phone]) ?Family history: Father MI (smoker, alcoholic, obese), Mother CHF ?Prior pertinent testing and/or incidental findings: aortic atherosclerosis but no coronary calcifications noted on prior chest CT (not cardiac study) ?Exercise level: [place] and walks with difficulty due to SOB ?Current diet: Does not eat  steak. Eats fruit and vegetables that she grows ? ?Past Medical History:  ?Diagnosis Date  ? Arthritis   ? Asthma   ? Depression   ? Goiter   ? Hypertension   ? ? ?Past Surgical History:  ?Procedure Laterality Date  ?  CHOLECYSTECTOMY    ? FOOT SURGERY    ? KNEE SURGERY Left   ? TKA x2    1974, 2014  ? SHOULDER SURGERY    ? TOTAL HIP ARTHROPLASTY Left   ? TOTAL KNEE ARTHROPLASTY Right 11/28/2020  ? Procedure: TOTAL KNEE ARTHROPLASTY;  Surgeon: Paralee Cancel, MD;  Location: [facility];  Service: Orthopedics;  Laterality: Right;  ? ? ?Current Medications: ?Current Outpatient Medications on File Prior to Visit  ?Medication Sig  ? albuterol (VENTOLIN HFA) 108 (90 Base) MCG/ACT inhaler Inhale 2 puffs into the lungs every 6 (six) hours as needed for wheezing or shortness of breath.  ? Ascorbic Acid (VITAMIN C) 1000 MG tablet Take 1,000 mg by mouth daily.  ? Budeson-Glycopyrrol-Formoterol (BREZTRI AEROSPHERE) 160-9-4.8 MCG/ACT AERO Inhale 2 puffs into the lungs in the morning and at bedtime.  ? calcium carbonate (TUMS - DOSED IN MG ELEMENTAL CALCIUM) 500 MG chewable tablet Chew 1,000 mg by mouth daily as needed for indigestion or heartburn.  ? cetirizine (ZYRTEC) 10 MG tablet Take 1 tablet (10 mg total) by mouth 2 (two) times daily. (Patient taking differently: Take 10 mg by mouth daily.)  ? Cholecalciferol (VITAMIN D) 50 MCG (2000 UT) tablet Take 1 tablet (2,000 Units total) by mouth 2 (two) times daily.  ? ferrous sulfate 325 (65 FE) MG EC tablet Take 2 tablets (650 mg total) by mouth daily. Take with food to avoid stomach upset.  ? fluticasone (FLONASE) 50 MCG/ACT nasal spray Place 1 spray into both nostrils daily.  ? hydrochlorothiazide (HYDRODIURIL) 25 MG tablet Take 1 tablet (25 mg total) by mouth every morning.  ? Magnesium 400 MG TABS Take 400 mg by mouth daily.  ? magnesium oxide (MAG-OX) 400 MG tablet Take 400 mg by mouth daily at 6 (six) AM.  ? omeprazole (PRILOSEC) 20 MG capsule TAKE ONE CAPSULE BY MOUTH DAILY 30 - 45 MINUTES BEFORE BREAKFAST  ? ondansetron (ZOFRAN-ODT) 4 MG disintegrating tablet Take 4 mg by mouth as needed.  ? sertraline (ZOLOFT) 100 MG tablet Take 1.5 tablets (150 mg total) by mouth every morning.  ? ?No  current facility-administered medications on file prior to visit.  ?  ? ?Allergies:   Mango flavor [flavoring agent] and Oxycontin [oxycodone]  ? ?Social History  ? ?Tobacco Use  ? Smoking status: Former  ?  Packs/day: 1.00  ?  Years: 4.00  ?  Pack years: 4.00  ?  Types: Cigarettes  ?  Quit date: 63  ?  Years since quitting: 49.3  ? Smokeless tobacco: Never  ?Vaping Use  ? Vaping Use: Never used  ?Substance Use Topics  ? Alcohol use: Yes  ?  Comment: Wine occasional  ? Drug use: No  ? ? ?Family History: ?family history includes Breast cancer in her maternal grandmother; Emphysema in her paternal grandmother; Heart disease in her mother. ? ?ROS:   ?Please see the history of present illness.  Additional pertinent ROS: ?Constitutional: Negative for chills, fever, night sweats, unintentional weight loss  ?HENT: Negative for ear pain and hearing loss.   ?Eyes: Negative for loss of vision and eye pain.  ?Respiratory: Negative for cough, sputum, wheezing.  Positive for shortness of breath (exertional) ?Cardiovascular: See  HPI. ?Gastrointestinal: Negative for abdominal pain, melena, and hematochezia.  ?Genitourinary: Negative for dysuria and hematuria.  ?Musculoskeletal: Negative for falls. Positive for joint pain (bilateral knees)  ?Skin: Negative for itching and rash.  ?Neurological: Negative for focal weakness, focal sensory changes and loss of consciousness.  ?Endo/Heme/Allergies: Does not bruise/bleed easily.   ? ?EKGs/Labs/Other Studies Reviewed:   ? ?The following studies were reviewed today: ?Thyroid US 04/16/21 ?IMPRESSION: ?4.5 cm right mid thyroid TR 4 nodule meets criteria for biopsy as ?above. This correlates with the CT finding. ?  ?2.9 cm left mid thyroid TR 3 nodule also meets criteria for biopsy ?as above. ?  ?1.3 cm right mid thyroid TR 4 nodule meets criteria follow-up in 1 ?year. ?  ?The above is in keeping with the ACR TI-RADS recommendations - J Am ?Coll Radiol [id number]. ? ?CT Chest 04/06/21 ?CT  CHEST FINDINGS ?  ?Cardiovascular: Thoracic aorta is well visualize without aneurysmal ?dilatation or dissection. Minimal atherosclerotic calcifications are ?noted. The pulmonary artery as visualized is within

## 2021-06-20 ENCOUNTER — Encounter (HOSPITAL_COMMUNITY): Payer: Self-pay

## 2021-06-20 DIAGNOSIS — H43811 Vitreous degeneration, right eye: Secondary | ICD-10-CM | POA: Diagnosis not present

## 2021-06-20 DIAGNOSIS — H2513 Age-related nuclear cataract, bilateral: Secondary | ICD-10-CM | POA: Diagnosis not present

## 2021-06-20 DIAGNOSIS — H353131 Nonexudative age-related macular degeneration, bilateral, early dry stage: Secondary | ICD-10-CM | POA: Diagnosis not present

## 2021-06-21 ENCOUNTER — Ambulatory Visit (HOSPITAL_BASED_OUTPATIENT_CLINIC_OR_DEPARTMENT_OTHER): Payer: Medicare PPO | Admitting: Cardiology

## 2021-06-21 ENCOUNTER — Encounter (HOSPITAL_BASED_OUTPATIENT_CLINIC_OR_DEPARTMENT_OTHER): Payer: Self-pay | Admitting: Cardiology

## 2021-06-21 VITALS — BP 124/76 | HR 61 | Ht 65.0 in | Wt 205.7 lb

## 2021-06-21 DIAGNOSIS — Z01812 Encounter for preprocedural laboratory examination: Secondary | ICD-10-CM

## 2021-06-21 DIAGNOSIS — Z01810 Encounter for preprocedural cardiovascular examination: Secondary | ICD-10-CM | POA: Diagnosis not present

## 2021-06-21 DIAGNOSIS — Z7189 Other specified counseling: Secondary | ICD-10-CM

## 2021-06-21 DIAGNOSIS — R0602 Shortness of breath: Secondary | ICD-10-CM | POA: Diagnosis not present

## 2021-06-21 DIAGNOSIS — E042 Nontoxic multinodular goiter: Secondary | ICD-10-CM | POA: Diagnosis not present

## 2021-06-21 DIAGNOSIS — I10 Essential (primary) hypertension: Secondary | ICD-10-CM

## 2021-06-21 LAB — BASIC METABOLIC PANEL
BUN/Creatinine Ratio: 31 — ABNORMAL HIGH (ref 12–28)
BUN: 23 mg/dL (ref 8–27)
CO2: 25 mmol/L (ref 20–29)
Calcium: 9.4 mg/dL (ref 8.7–10.3)
Chloride: 101 mmol/L (ref 96–106)
Creatinine, Ser: 0.74 mg/dL (ref 0.57–1.00)
Glucose: 88 mg/dL (ref 70–99)
Potassium: 5 mmol/L (ref 3.5–5.2)
Sodium: 140 mmol/L (ref 134–144)
eGFR: 89 mL/min/{1.73_m2} (ref >59)

## 2021-06-21 MED ORDER — METOPROLOL TARTRATE 25 MG PO TABS: ORAL_TABLET | ORAL | 0 refills | Status: DC

## 2021-06-21 NOTE — Progress Notes (Addendum)
Anesthesia Review: ? ?PCP: Jacolyn Reedy , NP 04/04/21  ?Cardiologist : 06/21/21- DR Harrell Gave- preop cardiovasculkar exam  ?Pulm- video visit- 05/24/21 Leslye Peer  ?Pulm visit on 06/27/21-  ?Chest x-ray : 06/26/21-  Janett Billow [facility] aware of report on 06/26/21.  NO new orders given.  ?CT chest- 04/09/21  ?Cardiac CT- 07/02/21  ?PFT- 05/14/21  ?EKG : 06/21/21  ?Echo :06/29/21  ?Stress test: ?Cardiac Cath :  ?Activity level: cannot doa flgiht of stiars withoiiut difficulty  ?Sleep Study/ CPAP : none  ?Fasting Blood Sugar :      / Checks Blood Sugar -- times a day:   ?Blood Thinner/ Instructions /Last Dose: ?ASA / Instructions/ Last Dose :   ?In Ed on 06/10/21 for floaters  ?BMP 06/21/21-epic  ?Positive for coivd on 05/19/2021- no symptoms at preop visit of 06/26/21.  ?PT states DR Quinn Axe is  having her have full workup prior to surgery due to shortness of breath and anemia.   ?

## 2021-06-21 NOTE — Progress Notes (Signed)
DUE TO COVID-19 ONLY  2  VISITOR IS ALLOWED TO COME WITH YOU AND STAY IN THE WAITING ROOM ONLY DURING PRE OP AND PROCEDURE DAY OF SURGERY.  4  VISITOR  MAY VISIT WITH YOU AFTER SURGERY IN YOUR PRIVATE ROOM DURING VISITING HOURS ONLY! ?YOU MAY HAVE ONE PERSON SPEND THE NITE WITH YOU IN YOUR ROOM AFTER SURGERY.   ? ? Your procedure is scheduled on:  ? 07/05/2021  ? Report to [facility] Main  Entrance ? ? Report to admitting at         0815am          AM ?DO NOT BRING INSURANCE CARD, PICTURE ID OR WALLET DAY OF SURGERY.  ?  ? ? Call this number if you have problems the morning of surgery [phone]  ? ? REMEMBER: NO  SOLID FOODS , CANDY, GUM OR MINTS AFTER MIDNITE THE NITE BEFORE SURGERY .       Marland Kitchen CLEAR LIQUIDS UNTIL     0730am            DAY OF SURGERY.      PLEASE FINISH ENSURE DRINK PER SURGEON ORDER  WHICH NEEDS TO BE COMPLETED AT  0730am        MORNING OF SURGERY.   ? ? ? ? ?CLEAR LIQUID DIET ? ? ?Foods Allowed      ?WATER ?BLACK COFFEE ( SUGAR OK, NO MILK, CREAM OR CREAMER) REGULAR AND DECAF  ?TEA ( SUGAR OK NO MILK, CREAM, OR CREAMER) REGULAR AND DECAF  ?PLAIN JELLO ( NO RED)  ?FRUIT ICES ( NO RED, NO FRUIT PULP)  ?POPSICLES ( NO RED)  ?JUICE- APPLE, WHITE GRAPE AND WHITE CRANBERRY  ?SPORT DRINK LIKE GATORADE ( NO RED)  ?CLEAR BROTH ( VEGETABLE , CHICKEN OR BEEF)                                                               ? ?    ? ?BRUSH YOUR TEETH MORNING OF SURGERY AND RINSE YOUR MOUTH OUT, NO CHEWING GUM CANDY OR MINTS. ?  ? ? Take these medicines the morning of surgery with A SIP OF WATER:  inhalers as usual and bring, zyrtec, flonase, omeprazole, zoloft  ? ? ?DO NOT TAKE ANY DIABETIC MEDICATIONS DAY OF YOUR SURGERY ?                  ?            You may not have any metal on your body including hair pins and  ?            piercings  Do not wear jewelry, make-up, lotions, powders or perfumes, deodorant ?            Do not wear nail polish on your fingernails.   ?           IF YOU ARE A FEMALE  AND WANT TO SHAVE UNDER ARMS OR LEGS PRIOR TO SURGERY YOU MUST DO SO AT LEAST 48 HOURS PRIOR TO SURGERY.  ?            Men may shave face and neck. ? ? Do not bring valuables to the hospital. [facility] NOT ?  RESPONSIBLE   FOR VALUABLES. ? Contacts, dentures or bridgework may not be worn into surgery. ? Leave suitcase in the car. After surgery it may be brought to your room. ? ?  ? Patients discharged the day of surgery will not be allowed to drive home. IF YOU ARE HAVING SURGERY AND GOING HOME THE SAME DAY, YOU MUST HAVE AN ADULT TO DRIVE YOU HOME AND BE WITH YOU FOR 24 HOURS. YOU MAY GO HOME BY TAXI OR UBER OR ORTHERWISE, BUT AN ADULT MUST ACCOMPANY YOU HOME AND STAY WITH YOU FOR 24 HOURS. ?  ? ?            Please read over the following fact sheets you were given: ?_____________________________________________________________________ ? ?[facility] - Preparing for Surgery ?Before surgery, you can play an important role.  Because skin is not sterile, your skin needs to be as free of germs as possible.  You can reduce the number of germs on your skin by washing with CHG (chlorahexidine gluconate) soap before surgery.  CHG is an antiseptic cleaner which kills germs and bonds with the skin to continue killing germs even after washing. ?Please DO NOT use if you have an allergy to CHG or antibacterial soaps.  If your skin becomes reddened/irritated stop using the CHG and inform your nurse when you arrive at Short Stay. ?Do not shave (including legs and underarms) for at least 48 hours prior to the first CHG shower.  You may shave your face/neck. ?Please follow these instructions carefully: ? 1.  Shower with CHG Soap the night before surgery and the  morning of Surgery. ? 2.  If you choose to wash your hair, wash your hair first as usual with your  normal  shampoo. ? 3.  After you shampoo, rinse your hair and body thoroughly to remove the  shampoo.                           4.  Use CHG as you would any  other liquid soap.  You can apply chg directly  to the skin and wash  ?                     Gently with a scrungie or clean washcloth. ? 5.  Apply the CHG Soap to your body ONLY FROM THE NECK DOWN.   Do not use on face/ open      ?                     Wound or open sores. Avoid contact with eyes, ears mouth and genitals (private parts).  ?                     Production manager,  Genitals (private parts) with your normal soap. ?            6.  Wash thoroughly, paying special attention to the area where your surgery  will be performed. ? 7.  Thoroughly rinse your body with warm water from the neck down. ? 8.  DO NOT shower/wash with your normal soap after using and rinsing off  the CHG Soap. ?               9.  Pat yourself dry with a clean towel. ?           10.  Wear clean pajamas. ?  11.  Place clean sheets on your bed the night of your first shower and do not  sleep with pets. ?Day of Surgery : ?Do not apply any lotions/deodorants the morning of surgery.  Please wear clean clothes to the hospital/surgery center. ? ?FAILURE TO FOLLOW THESE INSTRUCTIONS MAY RESULT IN THE CANCELLATION OF YOUR SURGERY ?PATIENT SIGNATURE_________________________________ ? ?NURSE SIGNATURE__________________________________ ? ?________________________________________________________________________  ? ? ?           ?

## 2021-06-21 NOTE — Patient Instructions (Addendum)
Medication Instructions:  ?Your Physician recommend you continue on your current medication as directed.   ? ?*If you need a refill on your cardiac medications before your next appointment, please call your pharmacy* ? ? ?Lab Work: ?Your provider has recommended lab work today (BMP). Please have this collected at [facility] at [place]. The lab is open 8:00 am - 4:30 pm. Please avoid 12:00p - 1:00p for lunch hour. You do not need an appointment. Please go to [address] [place], [place] [address]. This is in the Primary Care office on the 3rd floor, let them know you are there for blood work and they will direct you to the lab. ? ?If you have labs (blood work) drawn today and your tests are completely normal, you will receive your results only by: ?MyChart Message (if you have MyChart) OR ?A paper copy in the mail ?If you have any lab test that is abnormal or we need to change your treatment, we will call you to review the results. ? ? ?Testing/Procedures: ?Your physician has requested that you have an echocardiogram. Echocardiography is a painless test that uses sound waves to create images of your heart. It provides your doctor with information about the size and shape of your heart and how well your heart?s chambers and valves are working. This procedure takes approximately one hour. There are no restrictions for this procedure. ?[facility] ? ?Cardiac CT Angiography (CTA), is a special type of CT scan that uses a computer to produce multi-dimensional views of major blood vessels throughout the body. In CT angiography, a contrast material is injected through an IV to help visualize the blood vessels  ?[facility] ? ? ?Follow-Up: ?At [facility], you and your health needs are our priority.  As part of our continuing mission to provide you with exceptional heart care, we have created designated Provider Care Teams.  These Care Teams include your primary  Cardiologist (physician) and Advanced Practice Providers (APPs -  Physician Assistants and Nurse Practitioners) who all work together to provide you with the care you need, when you need it. ? ?We recommend signing up for the patient portal called "MyChart".  Sign up information is provided on this After Visit Summary.  MyChart is used to connect with patients for Virtual Visits (Telemedicine).  Patients are able to view lab/test results, encounter notes, upcoming appointments, etc.  Non-urgent messages can be sent to your provider as well.   ?To learn more about what you can do with MyChart, go to [email or web address].   ? ?Your next appointment:   ?As needed ? ?The format for your next appointment:   ?In Person ? ?Provider:   ?Buford Dresser, MD{ ? ? ? ?Your cardiac CT will be scheduled at one of the below locations:  ? ?[facility] ?[address] ?[place],  [address] ?[phone] ? ? ?If scheduled at [facility], please arrive at the [facility] and Children's Entrance (Entrance C2) of [facility] 30 minutes prior to test start time. ?You can use the FREE valet parking offered at entrance C (encouraged to control the heart rate for the test)  ?Proceed to the [facility] Radiology Department (first floor) to check-in and test prep. ? ?All radiology patients and guests should use entrance C2 at [facility], accessed from [facility], even though the hospital's physical address listed is [address] ? ? ? ?If scheduled at [facility], please arrive 15 mins early for check-in and test prep. ? ?Please follow these instructions carefully (unless otherwise directed): ? ?On the Night Before the Test: ?Be sure to Drink plenty of water. ?Do not consume any caffeinated/decaffeinated beverages or chocolate 12 hours prior to your test. ?Do not take any antihistamines 12 hours prior to your test. ? ?On the Day of the  Test: ?Drink plenty of water until 1 hour prior to the test. ?Do not eat any food 4 hours prior to the test. ?You may take your regular medications prior to the test.  ?Take metoprolol (Lopressor) 25 mg two hours prior to test. ?HOLD Hydrochlorothiazide morning of the test. ?FEMALES- please wear underwire-free bra if available, avoid dresses & tight clothing ? ?     ?After the Test: ?Drink plenty of water. ?After receiving IV contrast, you may experience a mild flushed feeling. This is normal. ?On occasion, you may experience a mild rash up to 24 hours after the test. This is not dangerous. If this occurs, you can take Benadryl 25 mg and increase your fluid intake. ?If you experience trouble breathing, this can be serious. If it is severe call 911 IMMEDIATELY. If it is mild, please call our office. ?If you take any of these medications: Glipizide/Metformin, Avandament, Glucavance, please do not take 48 hours after completing test unless otherwise instructed. ? ?We will call to schedule your test 2-4 weeks out understanding that some insurance companies will need an authorization prior to the service being performed.  ? ?For non-scheduling related questions, please contact the cardiac imaging nurse navigator should you have any questions/concerns: ?Marchia Bond, Cardiac Imaging Nurse Navigator ?Gordy Clement, Cardiac Imaging Nurse Navigator ?[facility] ?Direct Office Dial: [phone]  ? ?For scheduling needs, including cancellations and rescheduling, please call [place], [phone]. ? ? ? ? ? ? ? ? ?

## 2021-06-25 ENCOUNTER — Other Ambulatory Visit: Payer: Medicare PPO

## 2021-06-25 ENCOUNTER — Ambulatory Visit: Payer: Medicare PPO | Admitting: Oncology

## 2021-06-26 ENCOUNTER — Encounter (HOSPITAL_COMMUNITY): Payer: Self-pay

## 2021-06-26 ENCOUNTER — Ambulatory Visit (HOSPITAL_COMMUNITY)
Admission: RE | Admit: 2021-06-26 | Discharge: 2021-06-26 | Disposition: A | Discharge status: Home / Self Care | Payer: Medicare PPO | Source: Ambulatory Visit | Attending: Anesthesiology | Admitting: Anesthesiology

## 2021-06-26 ENCOUNTER — Encounter (HOSPITAL_COMMUNITY)
Admission: RE | Admit: 2021-06-26 | Discharge: 2021-06-26 | Disposition: A | Discharge status: Home / Self Care | Payer: Medicare PPO | Source: Ambulatory Visit | Attending: General Surgery | Admitting: General Surgery

## 2021-06-26 ENCOUNTER — Other Ambulatory Visit: Payer: Self-pay

## 2021-06-26 VITALS — BP 150/86 | HR 61 | Temp 98.1°F | Resp 16 | Ht 65.0 in | Wt 202.0 lb

## 2021-06-26 DIAGNOSIS — Z01818 Encounter for other preprocedural examination: Secondary | ICD-10-CM | POA: Insufficient documentation

## 2021-06-26 HISTORY — DX: Personal history of other diseases of the digestive system: Z87.19

## 2021-06-26 HISTORY — DX: Gastro-esophageal reflux disease without esophagitis: K21.9

## 2021-06-26 HISTORY — DX: Anxiety disorder, unspecified: F41.9

## 2021-06-26 HISTORY — DX: Anemia, unspecified: D64.9

## 2021-06-26 LAB — CBC
HCT: 41.7 % (ref 36.0–46.0)
Hemoglobin: 13.5 g/dL (ref 12.0–15.0)
MCH: 29 pg (ref 26.0–34.0)
MCHC: 32.4 g/dL (ref 30.0–36.0)
MCV: 89.5 fL (ref 80.0–100.0)
Platelets: 227 10*3/uL (ref 150–400)
RBC: 4.66 MIL/uL (ref 3.87–5.11)
RDW: 14.7 % (ref 11.5–15.5)
WBC: 8.5 10*3/uL (ref 4.0–10.5)
nRBC: 0 % (ref 0.0–0.2)

## 2021-06-26 NOTE — Progress Notes (Signed)
? ?Synopsis: Referred for dyspnea by Early, Coralee Pesa, NP ? ? ? ?Subjective:  ? ?PATIENT ID: [name] GENDER: female DOB: May 22, 1955, MRN: [id number] ? ?Chief Complaint  ?Patient presents with  ? Follow-up  ?  Breathing has improved. She still gets winded when walking up stairs or walking long distances. Her cough has resolved. She is using her albuterol rarely.   ? ?65yF with history of goiter, asthma on advair, HTN, large hiatal hernia referred for shortness of breath.  ? ?Never did get covid-19. She is vaccinated and boosted for covid-19.  ? ?She loves to garden, swim but is now very limited by her dyspnea. Has trouble even walking her dogs down the street. This has been essentially since June that she's noticed a decline. She has no orthopnea. She is unsure if she has worsening dyspnea walking into a pool with water up to her shoulders. She has no cough.  ? ?She has tried increasing dose of advair from 1 puff BID to 2 puffs BID with no improvement. To her this does not feel like asthma.  ? ?Not episodic. Feels this really whenever she tries to exert herself.  ? ?Last year had left hip and right knee replaced.  ? ?She has no family history of lung disease other than grandmother who had emphysema ? ?Smoked for 4 years in distant past. MJ in distant past. No vaping. 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  ? ?Interval HPI: ?Planning thyroidectomy. Changed to breztri last visit.  ? ?Breathing is better than at last visit but still has DOE above her baseline. Has upcoming CTA coronaries, TTE ordered after evaluation by cardiology.  ? ?Otherwise pertinent review of systems is negative. ? ? ?Past Medical History:  ?Diagnosis Date  ? Anemia   ? Anxiety   ? Arthritis   ? Asthma   ? Depression   ? GERD (gastroesophageal reflux disease)   ? Goiter   ? History of hiatal hernia   ? Hypertension   ?  ? ?Family History  ?Problem Relation Age of Onset  ? Heart disease Mother    ? Breast cancer Maternal Grandmother   ? Emphysema Paternal Grandmother   ?     never smoked but chewed tobacco  ?  ? ?Past Surgical History:  ?Procedure Laterality Date  ? CHOLECYSTECTOMY    ? FOOT SURGERY    ? KNEE SURGERY Left   ? TKA x2    1974, 2014  ? SHOULDER SURGERY    ? TOTAL HIP ARTHROPLASTY Left   ? TOTAL KNEE ARTHROPLASTY Right 11/28/2020  ? Procedure: TOTAL KNEE ARTHROPLASTY;  Surgeon: Paralee Cancel, MD;  Location: [facility];  Service: Orthopedics;  Laterality: Right;  ? ? ?Social History  ? ?Socioeconomic History  ? Marital status: Married  ?  Spouse name: Not on file  ? Number of children: Not on file  ? Years of education: Not on file  ? Highest education level: Not on file  ?Occupational History  ? Not on file  ?Tobacco Use  ? Smoking status: Former  ?  Packs/day: 1.00  ?  Years: 4.00  ?  Pack years: 4.00  ?  Types: Cigarettes  ?  Quit date: 35  ?  Years since quitting: 49.3  ? Smokeless tobacco: Never  ?Vaping Use  ? Vaping Use: Never used  ?Substance and Sexual Activity  ? Alcohol use: Yes  ?  Comment: Wine  occasional  ? Drug use: No  ? Sexual activity: Not on file  ?Other Topics Concern  ? Not on file  ?Social History Narrative  ? Not on file  ? ?Social Determinants of Health  ? ?Financial Resource Strain: Not on file  ?Food Insecurity: Not on file  ?Transportation Needs: Not on file  ?Physical Activity: Not on file  ?Stress: Not on file  ?Social Connections: Not on file  ?Intimate Partner Violence: Not on file  ?  ? ?Allergies  ?Allergen Reactions  ? Mango Flavor [Flavoring Agent] Anaphylaxis  ?  Specifically mango peel , patient can eat the fruit itself  ? Oxycontin [Oxycodone]   ?  Dizziness  ?  ? ?Outpatient Medications Prior to Visit  ?Medication Sig Dispense Refill  ? albuterol (VENTOLIN HFA) 108 (90 Base) MCG/ACT inhaler Inhale 2 puffs into the lungs every 6 (six) hours as needed for wheezing or shortness of breath. 1 each 3  ? Ascorbic Acid (VITAMIN C) 1000 MG tablet Take 1,000 mg by  mouth daily.    ? Benzocaine (SOLARCAINE EX) Apply 1 application. topically daily as needed (bug bites).    ? Budeson-Glycopyrrol-Formoterol (BREZTRI AEROSPHERE) 160-9-4.8 MCG/ACT AERO Inhale 2 puffs into the lungs in the morning and at bedtime. 10.7 g 11  ? calcium carbonate (TUMS - DOSED IN MG ELEMENTAL CALCIUM) 500 MG chewable tablet Chew 1,500 mg by mouth daily as needed for indigestion or heartburn.    ? cetirizine (ZYRTEC) 10 MG tablet Take 1 tablet (10 mg total) by mouth 2 (two) times daily. (Patient taking differently: Take 10 mg by mouth See admin instructions. Take 10 mg daily, may take a second 10 mg dose as needed for allergies) 60 tablet 3  ? Cholecalciferol (VITAMIN D) 50 MCG (2000 UT) tablet Take 1 tablet (2,000 Units total) by mouth 2 (two) times daily. 60 tablet 3  ? ferrous sulfate 325 (65 FE) MG EC tablet Take 2 tablets (650 mg total) by mouth daily. Take with food to avoid stomach upset. (Patient taking differently: Take 325 mg by mouth daily. Take with food to avoid stomach upset.) 180 tablet 0  ? fluticasone (FLONASE) 50 MCG/ACT nasal spray Place 1 spray into both nostrils daily. 16 g 6  ? hydrochlorothiazide (HYDRODIURIL) 25 MG tablet Take 1 tablet (25 mg total) by mouth every morning. 30 tablet 3  ? ibuprofen (ADVIL) 200 MG tablet Take 400 mg by mouth every 6 (six) hours as needed for moderate pain.    ? magnesium oxide (MAG-OX) 400 MG tablet Take 400 mg by mouth daily.    ? omeprazole (PRILOSEC) 20 MG capsule TAKE ONE CAPSULE BY MOUTH DAILY 30 - 45 MINUTES BEFORE BREAKFAST (Patient taking differently: Take 20 mg by mouth daily as needed (acid reflux).) 30 capsule 0  ? ondansetron (ZOFRAN-ODT) 4 MG disintegrating tablet Take 4 mg by mouth every 8 (eight) hours as needed for vomiting or nausea.    ? sertraline (ZOLOFT) 100 MG tablet Take 1.5 tablets (150 mg total) by mouth every morning. 45 tablet 3  ? metoprolol tartrate (LOPRESSOR) 25 MG tablet TAKE 1 TABLET 2 HR PRIOR TO CARDIAC PROCEDURE  (Patient not taking: Reported on 06/27/2021) 1 tablet 0  ? ?No facility-administered medications prior to visit.  ? ? ? ? ? ?Objective:  ? ?Physical Exam: ? ?General appearance: 66 y.o., female, NAD, conversant  ?Eyes: anicteric sclerae; PERRL, tracking appropriately ?HENT: NCAT; MMM ?Neck: Trachea midline; no lymphadenopathy, no JVD ?Lungs: CTAB, no crackles, no  wheeze, with normal respiratory effort ?CV: RRR, no murmur  ?Abdomen: Soft, non-tender; non-distended, BS present  ?Extremities: No peripheral edema, warm ?Skin: Normal turgor and texture; no rash ?Psych: Appropriate affect ?Neuro: Alert and oriented to person and place, no focal deficit  ? ? ? ? ?Vitals:  ? 06/27/21 0934  ?BP: 122/60  ?Pulse: 64  ?Temp: 98.8 ?F (37.1 ?C)  ?TempSrc: Oral  ?SpO2: 98%  ?Weight: 206 lb (93.4 kg)  ?Height: '5\' 5"'$  (1.651 m)  ? ? ?98% on RA ?BMI Readings from Last 3 Encounters:  ?06/27/21 34.28 kg/m?  ?06/26/21 33.61 kg/m?  ?06/21/21 34.23 kg/m?  ? ?Wt Readings from Last 3 Encounters:  ?06/27/21 206 lb (93.4 kg)  ?06/26/21 202 lb (91.6 kg)  ?06/21/21 205 lb 11.2 oz (93.3 kg)  ? ? ? ?CBC ?   ?Component Value Date/Time  ? WBC 8.5 06/26/2021 0815  ? RBC 4.66 06/26/2021 0815  ? HGB 13.5 06/26/2021 0815  ? HGB 12.8 03/26/2021 1114  ? HGB 9.2 (L) 01/23/2021 0945  ? HCT 41.7 06/26/2021 0815  ? HCT 30.8 (L) 01/23/2021 0945  ? PLT 227 06/26/2021 0815  ? PLT 232 03/26/2021 1114  ? PLT 313 01/23/2021 0945  ? MCV 89.5 06/26/2021 0815  ? MCV 77 (L) 01/23/2021 0945  ? MCH 29.0 06/26/2021 0815  ? MCHC 32.4 06/26/2021 0815  ? RDW 14.7 06/26/2021 0815  ? RDW 15.2 01/23/2021 0945  ? LYMPHSABS 1.1 04/26/2021 1601  ? LYMPHSABS 1.2 01/23/2021 0945  ? MONOABS 0.8 04/26/2021 1601  ? EOSABS 0.3 04/26/2021 1601  ? EOSABS 0.4 01/23/2021 0945  ? BASOSABS 0.1 04/26/2021 1601  ? BASOSABS 0.1 01/23/2021 0945  ? ? ?Eos 200-400 ? ?IgE 1000 ? ?Chest Imaging: ?CT Chest 04/06/21 reviewed by me remarkable for large hiatal hernia, a little scar in RML, lingula,  bases ? ?CXR 06/26/21 stable relative to prior ? ?Pulmonary Functions Testing Results: ? ?  Latest Ref Rng & Units 05/14/2021  ? 12:50 PM  ?PFT Results  ?FVC-Pre L 2.53    ?FVC-Predicted Pre % 77    ?FVC-Post L 2.7

## 2021-06-27 ENCOUNTER — Encounter: Payer: Self-pay | Admitting: Student

## 2021-06-27 ENCOUNTER — Ambulatory Visit: Payer: Medicare PPO | Admitting: Student

## 2021-06-27 VITALS — BP 122/60 | HR 64 | Temp 98.8°F | Ht 65.0 in | Wt 206.0 lb

## 2021-06-27 DIAGNOSIS — J4540 Moderate persistent asthma, uncomplicated: Secondary | ICD-10-CM

## 2021-06-27 DIAGNOSIS — R0609 Other forms of dyspnea: Secondary | ICD-10-CM | POA: Diagnosis not present

## 2021-06-27 NOTE — Patient Instructions (Signed)
-   We will see you in July ?- Send me message when your CT of your coronaries is done and I will review it  ?- Breztri 2 puffs twice daily with spacer - rinse mouth/gargle and rinse spacer after use ?- albuterol as needed, zyrtec daily, flonase through pollen season ?

## 2021-06-29 ENCOUNTER — Telehealth: Payer: Self-pay | Admitting: Cardiology

## 2021-06-29 ENCOUNTER — Telehealth (HOSPITAL_COMMUNITY): Payer: Self-pay | Admitting: Emergency Medicine

## 2021-06-29 ENCOUNTER — Ambulatory Visit (INDEPENDENT_AMBULATORY_CARE_PROVIDER_SITE_OTHER): Payer: Medicare PPO

## 2021-06-29 DIAGNOSIS — Z01810 Encounter for preprocedural cardiovascular examination: Secondary | ICD-10-CM | POA: Diagnosis not present

## 2021-06-29 DIAGNOSIS — R0602 Shortness of breath: Secondary | ICD-10-CM | POA: Diagnosis not present

## 2021-06-29 LAB — ECHOCARDIOGRAM COMPLETE
AR max vel: 2.41 cm2
AV Area VTI: 2.55 cm2
AV Area mean vel: 2.61 cm2
AV Mean grad: 3 mmHg
AV Peak grad: 5.9 mmHg
Ao pk vel: 1.21 m/s
Area-P 1/2: 4.83 cm2
Calc EF: 63 %
S' Lateral: 2.64 cm
Single Plane A2C EF: 67.3 %
Single Plane A4C EF: 60.9 %

## 2021-06-29 NOTE — Telephone Encounter (Signed)
See result note, no high risk findings

## 2021-06-29 NOTE — Telephone Encounter (Signed)
Please advise 

## 2021-06-29 NOTE — Telephone Encounter (Signed)
Results called to patient who verbalizes understanding!  ? ? ? ? ?"Normal function of the heart, mildly stiff. Normal valves. Stiffness of the heart has stretched the left atrium. Management is to make sure blood pressure is well controlled. No issues to affect surgery." ?

## 2021-06-29 NOTE — Telephone Encounter (Signed)
Reaching out to patient to offer assistance regarding upcoming cardiac imaging study; pt verbalizes understanding of appt date/time, parking situation and where to check in, pre-test NPO status and medications ordered, and verified current allergies; name and call back number provided for further questions should they arise ?Marchia Bond RN Navigator Cardiac Imaging ?[facility] ?[phone] office ?[phone] cell ? ?'25mg'$  metoprolol tartrate  ?Denies iv issues ?Arrival 330 ? ?

## 2021-06-29 NOTE — Telephone Encounter (Signed)
Patient is scheduled to have surgery next week. She wants to make sure it's safe for her to have surgery, otherwise she needs to make changes. She doesn't want to go the weekend without know what it says.  I did advise her Dr. Harrell Gave has not had a chance to review the Echo yet.  ?

## 2021-07-02 ENCOUNTER — Ambulatory Visit (HOSPITAL_COMMUNITY)
Admission: RE | Admit: 2021-07-02 | Discharge: 2021-07-02 | Disposition: A | Discharge status: Home / Self Care | Payer: Medicare PPO | Source: Ambulatory Visit | Attending: Cardiology | Admitting: Cardiology

## 2021-07-02 DIAGNOSIS — R0602 Shortness of breath: Secondary | ICD-10-CM | POA: Diagnosis not present

## 2021-07-02 DIAGNOSIS — I2510 Atherosclerotic heart disease of native coronary artery without angina pectoris: Secondary | ICD-10-CM | POA: Insufficient documentation

## 2021-07-02 DIAGNOSIS — K449 Diaphragmatic hernia without obstruction or gangrene: Secondary | ICD-10-CM | POA: Insufficient documentation

## 2021-07-02 DIAGNOSIS — Z01810 Encounter for preprocedural cardiovascular examination: Secondary | ICD-10-CM | POA: Insufficient documentation

## 2021-07-02 MED ORDER — NITROGLYCERIN 0.4 MG SL SUBL
0.8000 mg | SUBLINGUAL_TABLET | Freq: Once | SUBLINGUAL | Status: AC
Start: 2021-07-02 — End: 2021-07-02
  Administered 2021-07-02: 0.8 mg via SUBLINGUAL

## 2021-07-02 MED ORDER — NITROGLYCERIN 0.4 MG SL SUBL
SUBLINGUAL_TABLET | SUBLINGUAL | Status: AC
  Filled 2021-07-02: qty 2

## 2021-07-02 MED ORDER — IOHEXOL 350 MG/ML SOLN
100.0000 mL | Freq: Once | INTRAVENOUS | Status: AC | PRN
  Administered 2021-07-02: 100 mL via INTRAVENOUS

## 2021-07-03 NOTE — Anesthesia Preprocedure Evaluation (Addendum)
Anesthesia Evaluation  ?Patient identified by MRN, date of birth, ID band ?Patient awake ? ? ? ?Reviewed: ?Allergy & Precautions, NPO status , Patient's Chart, lab work & pertinent test results, reviewed documented beta blocker date and time  ? ?Airway ?Mallampati: III ? ?TM Distance: >3 FB ?Neck ROM: Full ? ? ? Dental ?no notable dental hx. ? ?  ?Pulmonary ?asthma , former smoker,  ?Quit smoking 1974, former light smoker  ?Snores at night, no witnessed apneas ?  ?Pulmonary exam normal ?breath sounds clear to auscultation ? ? ? ? ? ? Cardiovascular ?hypertension (124/72 in preop ), Pt. on medications and Pt. on home beta blockers ?Normal cardiovascular exam ?Rhythm:Regular Rate:Normal ? ? ?  ?Neuro/Psych ?PSYCHIATRIC DISORDERS Anxiety Depression negative neurological ROS ?   ? GI/Hepatic ?Neg liver ROS, hiatal hernia, GERD  Medicated and Controlled,  ?Endo/Other  ?BMI 34 ? Renal/GU ?negative Renal ROS  ?negative genitourinary ?  ?Musculoskeletal ? ?(+) Arthritis , Osteoarthritis,   ? Abdominal ?  ?Peds ?negative pediatric ROS ?(+)  Hematology ?negative hematology ROS ?(+) Hb 13.5   ?Anesthesia Other Findings ? ? Reproductive/Obstetrics ?negative OB ROS ? ?  ? ? ? ? ? ? ? ? ? ? ? ? ? ?  ?  ? ? ? ? ? ? ? ?Anesthesia Physical ?Anesthesia Plan ? ?ASA: 3 ? ?Anesthesia Plan: General  ? ?Post-op Pain Management: Tylenol PO (pre-op)*  ? ?Induction: Intravenous ? ?PONV Risk Score and Plan: 3 and Ondansetron, Dexamethasone, Midazolam and Treatment may vary due to age or medical condition ? ?Airway Management Planned: Oral ETT ? ?Additional Equipment: None ? ?Intra-op Plan:  ? ?Post-operative Plan: Extubation in OR ? ?Informed Consent: I have reviewed the patients History and Physical, chart, labs and discussed the procedure including the risks, benefits and alternatives for the proposed anesthesia with the patient or authorized representative who has indicated his/her understanding and  acceptance.  ? ? ? ?Dental advisory given ? ?Plan Discussed with: CRNA ? ?Anesthesia Plan Comments:   ? ? ? ? ? ?Anesthesia Quick Evaluation ? ?

## 2021-07-03 NOTE — Progress Notes (Signed)
Anesthesia Chart Review ? ? Case: [id number] Date/Time: 07/05/21 1015  ? Procedure: TOTAL THYROIDECTOMY  ? Anesthesia type: General  ? Pre-op diagnosis: SUSPICIOUS FOR MALIGNACY  ? Location: [facility] ROOM 01 / [facility]  ? Surgeons: [name], [name], MD  ? ?  ? ? ?DISCUSSION:66 y.o. former smoker with h/o HTN, GERD, hiatal hernia, thyroid suspicious for malignancy scheduled for above procedure 07/05/2021 with Dr. Gurney Maxin.  ? ?Pt seen by pulmonology 06/27/2021. Per OV note, "With regard to her upcoming thyroidectomy and Pulmonary pre-op evalulation: ?  ?For Ms. Ashby, risk of perioperative pulmonary complications is increased by: ?            '[X]'$  Age greater than 42 yeas ?Respiratory complications generally occur in 1% of ASA Class I patients, 5% of ASA Class II and 10% of ASA Class III-IV patients. She is low risk by ARISCAT for in-hospital post-operative pulmonary complications. These complications rarely result in mortality and include postoperative pneumonia, atelectasis, pulmonary embolism, ARDS and increased time requiring postoperative mechanical ventilation. ?  ?Overall, I recommend proceeding with the surgery if the risk for respiratory complications are outweighed by the potential benefits. This will need to be discussed between the patient and surgeon. ?  ?To reduce risks of respiratory complications, I recommend: ?--Pre- and post-operative incentive spirometry performed frequently while awake ?--Avoiding use of pancuronium during anesthesia ?--Encourage mobility and incentive spirometry post-op ?--Early implementation of DVT chemoprophylaxis when appropriate from surgery perspective" ? ?Seen by cardiology for preoperative evaluation 06/21/2021.  Echo ordered at this visit. Per echo results, "Normal function of the heart, mildly stiff. Normal valves. Stiffness of the heart has stretched the left atrium. Management is to make sure blood pressure is well controlled. No issues to affect surgery." ? ?Anticipate  pt can proceed with planned procedure barring acute status change.   ?VS: BP (!) 150/86   Pulse 61   Temp 36.7 ?C (Oral)   Resp 16   Ht '5\' 5"'$  (1.651 m)   Wt 91.6 kg   LMP 08/08/2010   SpO2 98%   BMI 33.61 kg/m?  ? ?PROVIDERS: ?Early, Coralee Pesa, NP is PCP  ? ?Buford Dresser, MD is Cardiologist  ?LABS: Labs reviewed: Acceptable for surgery. ?(all labs ordered are listed, but only abnormal results are displayed) ? ?Labs Reviewed  ?CBC  ? ? ? ?IMAGES: ? ? ?EKG: ?06/21/2021 ?Rate 61 bpm  ?NSR ? ?CV: ?Echo 06/29/2021 ?1. Left ventricular ejection fraction, by estimation, is 60 to 65%. The  ?left ventricle has normal function. The left ventricle has no regional  ?wall motion abnormalities. Left ventricular diastolic parameters are  ?consistent with Grade II diastolic  ?dysfunction (pseudonormalization). The average left ventricular global  ?longitudinal strain is -19.0 %. The global longitudinal strain is normal.  ? 2. Right ventricular systolic function is normal. The right ventricular  ?size is normal. There is normal pulmonary artery systolic pressure.  ? 3. Left atrial size was severely dilated.  ? 4. Right atrial size was mildly dilated.  ? 5. The mitral valve is degenerative. No evidence of mitral valve  ?regurgitation.  ? 6. The aortic valve is normal in structure. Aortic valve regurgitation is  ?not visualized.  ? 7. The inferior vena cava is dilated in size with >50% respiratory  ?variability, suggesting right atrial pressure of 8 mmHg.  ?Past Medical History:  ?Diagnosis Date  ? Anemia   ? Anxiety   ? Arthritis   ? Asthma   ? Depression   ?  GERD (gastroesophageal reflux disease)   ? Goiter   ? History of hiatal hernia   ? Hypertension   ? ? ?Past Surgical History:  ?Procedure Laterality Date  ? CHOLECYSTECTOMY    ? FOOT SURGERY    ? KNEE SURGERY Left   ? TKA x2    1974, 2014  ? SHOULDER SURGERY    ? TOTAL HIP ARTHROPLASTY Left   ? TOTAL KNEE ARTHROPLASTY Right 11/28/2020  ? Procedure: TOTAL KNEE  ARTHROPLASTY;  Surgeon: Paralee Cancel, MD;  Location: [facility];  Service: Orthopedics;  Laterality: Right;  ? ? ?MEDICATIONS: ? albuterol (VENTOLIN HFA) 108 (90 Base) MCG/ACT inhaler  ? Ascorbic Acid (VITAMIN C) 1000 MG tablet  ? Benzocaine (SOLARCAINE EX)  ? Budeson-Glycopyrrol-Formoterol (BREZTRI AEROSPHERE) 160-9-4.8 MCG/ACT AERO  ? calcium carbonate (TUMS - DOSED IN MG ELEMENTAL CALCIUM) 500 MG chewable tablet  ? cetirizine (ZYRTEC) 10 MG tablet  ? Cholecalciferol (VITAMIN D) 50 MCG (2000 UT) tablet  ? ferrous sulfate 325 (65 FE) MG EC tablet  ? fluticasone (FLONASE) 50 MCG/ACT nasal spray  ? hydrochlorothiazide (HYDRODIURIL) 25 MG tablet  ? ibuprofen (ADVIL) 200 MG tablet  ? magnesium oxide (MAG-OX) 400 MG tablet  ? metoprolol tartrate (LOPRESSOR) 25 MG tablet  ? omeprazole (PRILOSEC) 20 MG capsule  ? ondansetron (ZOFRAN-ODT) 4 MG disintegrating tablet  ? sertraline (ZOLOFT) 100 MG tablet  ? ?No current facility-administered medications for this encounter.  ? ? ? ? ?Konrad Felix Ward, PA-C ?[facility] ?[phone] ? ? ? ? ?

## 2021-07-05 ENCOUNTER — Encounter (HOSPITAL_COMMUNITY)
Admission: RE | Disposition: A | Discharge status: Home / Self Care | Payer: Self-pay | Source: Home / Self Care | Attending: General Surgery

## 2021-07-05 ENCOUNTER — Observation Stay (HOSPITAL_COMMUNITY)
Admission: RE | Admit: 2021-07-05 | Discharge: 2021-07-06 | Disposition: A | Discharge status: Home / Self Care | Payer: Medicare PPO | Attending: General Surgery | Admitting: General Surgery

## 2021-07-05 ENCOUNTER — Other Ambulatory Visit: Payer: Self-pay

## 2021-07-05 ENCOUNTER — Encounter (HOSPITAL_COMMUNITY): Payer: Self-pay | Admitting: General Surgery

## 2021-07-05 ENCOUNTER — Ambulatory Visit (HOSPITAL_BASED_OUTPATIENT_CLINIC_OR_DEPARTMENT_OTHER): Payer: Medicare PPO | Admitting: Certified Registered"

## 2021-07-05 ENCOUNTER — Ambulatory Visit (HOSPITAL_COMMUNITY): Payer: Medicare PPO | Admitting: Physician Assistant

## 2021-07-05 DIAGNOSIS — C73 Malignant neoplasm of thyroid gland: Secondary | ICD-10-CM | POA: Diagnosis not present

## 2021-07-05 DIAGNOSIS — E042 Nontoxic multinodular goiter: Secondary | ICD-10-CM

## 2021-07-05 DIAGNOSIS — J45909 Unspecified asthma, uncomplicated: Secondary | ICD-10-CM | POA: Insufficient documentation

## 2021-07-05 DIAGNOSIS — Z87891 Personal history of nicotine dependence: Secondary | ICD-10-CM | POA: Diagnosis not present

## 2021-07-05 DIAGNOSIS — I10 Essential (primary) hypertension: Secondary | ICD-10-CM

## 2021-07-05 DIAGNOSIS — Z79899 Other long term (current) drug therapy: Secondary | ICD-10-CM | POA: Insufficient documentation

## 2021-07-05 DIAGNOSIS — E079 Disorder of thyroid, unspecified: Secondary | ICD-10-CM | POA: Diagnosis present

## 2021-07-05 DIAGNOSIS — F418 Other specified anxiety disorders: Secondary | ICD-10-CM

## 2021-07-05 DIAGNOSIS — M1990 Unspecified osteoarthritis, unspecified site: Secondary | ICD-10-CM

## 2021-07-05 HISTORY — PX: THYROIDECTOMY: SHX17

## 2021-07-05 SURGERY — THYROIDECTOMY
Anesthesia: General

## 2021-07-05 MED ORDER — ROCURONIUM BROMIDE 10 MG/ML (PF) SYRINGE
PREFILLED_SYRINGE | INTRAVENOUS | Status: AC
  Filled 2021-07-05: qty 10

## 2021-07-05 MED ORDER — DEXAMETHASONE SODIUM PHOSPHATE 10 MG/ML IJ SOLN
INTRAMUSCULAR | Status: AC
  Filled 2021-07-05: qty 1

## 2021-07-05 MED ORDER — SERTRALINE HCL 100 MG PO TABS
150.0000 mg | ORAL_TABLET | Freq: Every morning | ORAL | Status: DC
  Administered 2021-07-06: 150 mg via ORAL
  Filled 2021-07-05: qty 1

## 2021-07-05 MED ORDER — ONDANSETRON HCL 4 MG/2ML IJ SOLN
INTRAMUSCULAR | Status: AC
  Filled 2021-07-05: qty 2

## 2021-07-05 MED ORDER — METOPROLOL TARTRATE 5 MG/5ML IV SOLN: 5.0000 mg | Freq: Four times a day (QID) | INTRAVENOUS | Status: DC | PRN

## 2021-07-05 MED ORDER — HYDROMORPHONE HCL 1 MG/ML IJ SOLN
0.2500 mg | INTRAMUSCULAR | Status: DC | PRN
  Administered 2021-07-05: 0.25 mg via INTRAVENOUS
  Administered 2021-07-05: 0.5 mg via INTRAVENOUS
  Administered 2021-07-05: 0.25 mg via INTRAVENOUS
  Administered 2021-07-05 (×2): 0.5 mg via INTRAVENOUS

## 2021-07-05 MED ORDER — BUDESON-GLYCOPYRROL-FORMOTEROL 160-9-4.8 MCG/ACT IN AERO: 2.0000 | INHALATION_SPRAY | Freq: Every day | RESPIRATORY_TRACT | Status: DC

## 2021-07-05 MED ORDER — HYDROCODONE-ACETAMINOPHEN 7.5-325 MG PO TABS: 1.0000 | ORAL_TABLET | Freq: Once | ORAL | Status: DC | PRN

## 2021-07-05 MED ORDER — CHLORHEXIDINE GLUCONATE CLOTH 2 % EX PADS: 6.0000 | MEDICATED_PAD | Freq: Once | CUTANEOUS | Status: DC

## 2021-07-05 MED ORDER — 0.9 % SODIUM CHLORIDE (POUR BTL) OPTIME
TOPICAL | Status: DC | PRN
  Administered 2021-07-05: 1000 mL

## 2021-07-05 MED ORDER — FENTANYL CITRATE (PF) 100 MCG/2ML IJ SOLN
INTRAMUSCULAR | Status: AC
  Filled 2021-07-05: qty 2

## 2021-07-05 MED ORDER — TRAMADOL HCL 50 MG PO TABS
50.0000 mg | ORAL_TABLET | Freq: Four times a day (QID) | ORAL | Status: DC | PRN
  Administered 2021-07-05 – 2021-07-06 (×3): 50 mg via ORAL
  Filled 2021-07-05 (×3): qty 1

## 2021-07-05 MED ORDER — DEXAMETHASONE SODIUM PHOSPHATE 10 MG/ML IJ SOLN
INTRAMUSCULAR | Status: DC | PRN
Start: 2021-07-05 — End: 2021-07-05
  Administered 2021-07-05: 10 mg via INTRAVENOUS

## 2021-07-05 MED ORDER — LIDOCAINE HCL (PF) 2 % IJ SOLN
INTRAMUSCULAR | Status: AC
  Filled 2021-07-05: qty 5

## 2021-07-05 MED ORDER — IBUPROFEN 400 MG PO TABS: 400.0000 mg | ORAL_TABLET | Freq: Four times a day (QID) | ORAL | Status: DC | PRN

## 2021-07-05 MED ORDER — PANTOPRAZOLE SODIUM 40 MG PO TBEC
40.0000 mg | DELAYED_RELEASE_TABLET | Freq: Every day | ORAL | Status: DC
  Administered 2021-07-05 – 2021-07-06 (×2): 40 mg via ORAL
  Filled 2021-07-05 (×2): qty 1

## 2021-07-05 MED ORDER — ENSURE PRE-SURGERY PO LIQD
296.0000 mL | Freq: Once | ORAL | Status: DC
  Filled 2021-07-05: qty 296

## 2021-07-05 MED ORDER — ACETAMINOPHEN 500 MG PO TABS
1000.0000 mg | ORAL_TABLET | ORAL | Status: AC
  Administered 2021-07-05: 1000 mg via ORAL
  Filled 2021-07-05: qty 2

## 2021-07-05 MED ORDER — ONDANSETRON HCL 4 MG/2ML IJ SOLN: 4.0000 mg | Freq: Four times a day (QID) | INTRAMUSCULAR | Status: DC | PRN

## 2021-07-05 MED ORDER — ONDANSETRON 4 MG PO TBDP
4.0000 mg | ORAL_TABLET | Freq: Four times a day (QID) | ORAL | Status: DC | PRN
Start: 2021-07-05 — End: 2021-07-06

## 2021-07-05 MED ORDER — HYDROMORPHONE HCL 1 MG/ML IJ SOLN
INTRAMUSCULAR | Status: AC
  Filled 2021-07-05: qty 1

## 2021-07-05 MED ORDER — ONDANSETRON HCL 4 MG/2ML IJ SOLN: 4.0000 mg | Freq: Once | INTRAMUSCULAR | Status: DC | PRN

## 2021-07-05 MED ORDER — DIPHENHYDRAMINE HCL 12.5 MG/5ML PO ELIX: 12.5000 mg | ORAL_SOLUTION | Freq: Four times a day (QID) | ORAL | Status: DC | PRN

## 2021-07-05 MED ORDER — MIDAZOLAM HCL 5 MG/5ML IJ SOLN
INTRAMUSCULAR | Status: DC | PRN
  Administered 2021-07-05: 2 mg via INTRAVENOUS

## 2021-07-05 MED ORDER — CEFAZOLIN SODIUM-DEXTROSE 2-4 GM/100ML-% IV SOLN
2.0000 g | INTRAVENOUS | Status: AC
  Administered 2021-07-05: 2 g via INTRAVENOUS
  Filled 2021-07-05: qty 100

## 2021-07-05 MED ORDER — LACTATED RINGERS IV SOLN: INTRAVENOUS | Status: DC

## 2021-07-05 MED ORDER — LORATADINE 10 MG PO TABS
10.0000 mg | ORAL_TABLET | Freq: Every day | ORAL | Status: DC
  Administered 2021-07-05 – 2021-07-06 (×2): 10 mg via ORAL
  Filled 2021-07-05 (×2): qty 1

## 2021-07-05 MED ORDER — FENTANYL CITRATE (PF) 100 MCG/2ML IJ SOLN
INTRAMUSCULAR | Status: DC | PRN
  Administered 2021-07-05 (×4): 50 ug via INTRAVENOUS

## 2021-07-05 MED ORDER — ONDANSETRON HCL 4 MG/2ML IJ SOLN
INTRAMUSCULAR | Status: DC | PRN
Start: 2021-07-05 — End: 2021-07-05
  Administered 2021-07-05: 4 mg via INTRAVENOUS

## 2021-07-05 MED ORDER — MORPHINE SULFATE (PF) 2 MG/ML IV SOLN: 2.0000 mg | INTRAVENOUS | Status: DC | PRN

## 2021-07-05 MED ORDER — CELECOXIB 200 MG PO CAPS
400.0000 mg | ORAL_CAPSULE | ORAL | Status: AC
  Administered 2021-07-05: 400 mg via ORAL
  Filled 2021-07-05: qty 2

## 2021-07-05 MED ORDER — CHLORHEXIDINE GLUCONATE 0.12 % MT SOLN
15.0000 mL | Freq: Once | OROMUCOSAL | Status: AC
  Administered 2021-07-05: 15 mL via OROMUCOSAL

## 2021-07-05 MED ORDER — ALBUTEROL SULFATE (2.5 MG/3ML) 0.083% IN NEBU: 3.0000 mL | INHALATION_SOLUTION | Freq: Four times a day (QID) | RESPIRATORY_TRACT | Status: DC | PRN

## 2021-07-05 MED ORDER — PROPOFOL 10 MG/ML IV BOLUS
INTRAVENOUS | Status: DC | PRN
  Administered 2021-07-05: 150 mg via INTRAVENOUS

## 2021-07-05 MED ORDER — HYDROCHLOROTHIAZIDE 25 MG PO TABS
25.0000 mg | ORAL_TABLET | Freq: Every morning | ORAL | Status: DC
  Administered 2021-07-05 – 2021-07-06 (×2): 25 mg via ORAL
  Filled 2021-07-05 (×2): qty 1

## 2021-07-05 MED ORDER — ROCURONIUM BROMIDE 100 MG/10ML IV SOLN
INTRAVENOUS | Status: DC | PRN
  Administered 2021-07-05: 10 mg via INTRAVENOUS
  Administered 2021-07-05: 80 mg via INTRAVENOUS

## 2021-07-05 MED ORDER — LIDOCAINE HCL (CARDIAC) PF 100 MG/5ML IV SOSY
PREFILLED_SYRINGE | INTRAVENOUS | Status: DC | PRN
Start: 2021-07-05 — End: 2021-07-05
  Administered 2021-07-05: 80 mg via INTRAVENOUS

## 2021-07-05 MED ORDER — DIPHENHYDRAMINE HCL 50 MG/ML IJ SOLN: 12.5000 mg | Freq: Four times a day (QID) | INTRAMUSCULAR | Status: DC | PRN

## 2021-07-05 MED ORDER — GLYCOPYRROLATE 0.2 MG/ML IJ SOLN
INTRAMUSCULAR | Status: AC
  Filled 2021-07-05: qty 1

## 2021-07-05 MED ORDER — MIDAZOLAM HCL 2 MG/2ML IJ SOLN
INTRAMUSCULAR | Status: AC
  Filled 2021-07-05: qty 2

## 2021-07-05 MED ORDER — SUGAMMADEX SODIUM 200 MG/2ML IV SOLN
INTRAVENOUS | Status: DC | PRN
  Administered 2021-07-05: 200 mg via INTRAVENOUS

## 2021-07-05 MED ORDER — HEMOSTATIC AGENTS (NO CHARGE) OPTIME
TOPICAL | Status: DC | PRN
  Administered 2021-07-05: 1

## 2021-07-05 MED ORDER — BUPIVACAINE-EPINEPHRINE 0.25% -1:200000 IJ SOLN
INTRAMUSCULAR | Status: DC | PRN
  Administered 2021-07-05: 10 mL

## 2021-07-05 MED ORDER — BUPIVACAINE-EPINEPHRINE (PF) 0.25% -1:200000 IJ SOLN
INTRAMUSCULAR | Status: AC
  Filled 2021-07-05: qty 30

## 2021-07-05 MED ORDER — ROCURONIUM BROMIDE 10 MG/ML (PF) SYRINGE: PREFILLED_SYRINGE | INTRAVENOUS | Status: DC | PRN

## 2021-07-05 MED ORDER — ORAL CARE MOUTH RINSE: 15.0000 mL | Freq: Once | OROMUCOSAL | Status: AC

## 2021-07-05 MED ORDER — PHENYLEPHRINE 80 MCG/ML (10ML) SYRINGE FOR IV PUSH (FOR BLOOD PRESSURE SUPPORT)
PREFILLED_SYRINGE | INTRAVENOUS | Status: AC
  Filled 2021-07-05: qty 10

## 2021-07-05 MED ORDER — AMISULPRIDE (ANTIEMETIC) 5 MG/2ML IV SOLN: 10.0000 mg | Freq: Once | INTRAVENOUS | Status: DC | PRN

## 2021-07-05 MED ORDER — GLYCOPYRROLATE 0.2 MG/ML IJ SOLN
INTRAMUSCULAR | Status: DC | PRN
  Administered 2021-07-05: .2 mg via INTRAVENOUS

## 2021-07-05 MED ORDER — PROPOFOL 10 MG/ML IV BOLUS
INTRAVENOUS | Status: AC
Start: 2021-07-05 — End: ?
  Filled 2021-07-05: qty 20

## 2021-07-05 SURGICAL SUPPLY — 47 items
ADH SKN CLS APL DERMABOND .7 (GAUZE/BANDAGES/DRESSINGS) ×1
APL PRP STRL LF DISP 70% ISPRP (MISCELLANEOUS) ×1
ATTRACTOMAT 16X20 MAGNETIC DRP (DRAPES) ×2 IMPLANT
BAG COUNTER SPONGE SURGICOUNT (BAG) IMPLANT
BAG SPNG CNTER NS LX DISP (BAG)
BLADE SURG 15 STRL LF DISP TIS (BLADE) ×1 IMPLANT
BLADE SURG 15 STRL SS (BLADE) ×2
CHLORAPREP W/TINT 26 (MISCELLANEOUS) ×2 IMPLANT
CLIP TI MEDIUM 6 (CLIP) ×6 IMPLANT
CLIP TI WIDE RED SMALL 6 (CLIP) ×4 IMPLANT
COVER SURGICAL LIGHT HANDLE (MISCELLANEOUS) ×2 IMPLANT
DERMABOND ADVANCED (GAUZE/BANDAGES/DRESSINGS) ×1
DERMABOND ADVANCED .7 DNX12 (GAUZE/BANDAGES/DRESSINGS) ×1 IMPLANT
DISSECTOR SURG LIGASURE 21 (MISCELLANEOUS) ×1 IMPLANT
DRAPE LAPAROTOMY T 98X78 PEDS (DRAPES) ×2 IMPLANT
DRAPE UTILITY XL STRL (DRAPES) ×2 IMPLANT
ELECT REM PT RETURN 15FT ADLT (MISCELLANEOUS) ×2 IMPLANT
GAUZE 4X4 16PLY ~~LOC~~+RFID DBL (SPONGE) ×2 IMPLANT
GLOVE BIOGEL PI IND STRL 7.0 (GLOVE) ×1 IMPLANT
GLOVE BIOGEL PI INDICATOR 7.0 (GLOVE) ×1
GLOVE SURG SS PI 7.0 STRL IVOR (GLOVE) ×2 IMPLANT
GOWN STRL REUS W/ TWL LRG LVL3 (GOWN DISPOSABLE) ×1 IMPLANT
GOWN STRL REUS W/ TWL XL LVL3 (GOWN DISPOSABLE) IMPLANT
GOWN STRL REUS W/TWL LRG LVL3 (GOWN DISPOSABLE) ×2
GOWN STRL REUS W/TWL XL LVL3 (GOWN DISPOSABLE)
HEMOSTAT SURGICEL 2X4 FIBR (HEMOSTASIS) ×2 IMPLANT
ILLUMINATOR WAVEGUIDE N/F (MISCELLANEOUS) ×1 IMPLANT
KIT BASIN OR (CUSTOM PROCEDURE TRAY) ×2 IMPLANT
KIT TURNOVER KIT A (KITS) IMPLANT
NDL HYPO 25X1 1.5 SAFETY (NEEDLE) ×1 IMPLANT
NEEDLE HYPO 25X1 1.5 SAFETY (NEEDLE) ×2 IMPLANT
NS IRRIG 1000ML POUR BTL (IV SOLUTION) ×1 IMPLANT
PACK BASIC VI WITH GOWN DISP (CUSTOM PROCEDURE TRAY) ×2 IMPLANT
PENCIL SMOKE EVACUATOR (MISCELLANEOUS) ×1 IMPLANT
SHEARS HARMONIC 9CM CVD (BLADE) ×1 IMPLANT
STAPLER VISISTAT 35W (STAPLE) IMPLANT
SUT MNCRL AB 4-0 PS2 18 (SUTURE) ×2 IMPLANT
SUT SILK 2 0 (SUTURE)
SUT SILK 2-0 18XBRD TIE 12 (SUTURE) IMPLANT
SUT SILK 3 0 (SUTURE)
SUT SILK 3-0 18XBRD TIE 12 (SUTURE) IMPLANT
SUT VIC AB 3-0 SH 18 (SUTURE) ×4 IMPLANT
SYR BULB IRRIG 60ML STRL (SYRINGE) ×2 IMPLANT
SYR CONTROL 10ML LL (SYRINGE) IMPLANT
TOWEL OR 17X26 10 PK STRL BLUE (TOWEL DISPOSABLE) ×2 IMPLANT
TOWEL OR NON WOVEN STRL DISP B (DISPOSABLE) ×2 IMPLANT
TUBING CONNECTING 10 (TUBING) ×2 IMPLANT

## 2021-07-05 NOTE — Transfer of Care (Signed)
Immediate Anesthesia Transfer of Care Note ? ?Patient: [name] ? ?Procedure(s) Performed: TOTAL THYROIDECTOMY ? ?Patient Location: PACU ? ?Anesthesia Type:General ? ?Level of Consciousness: awake, alert  and oriented ? ?Airway & Oxygen Therapy: Patient Spontanous Breathing and Patient connected to face mask oxygen ? ?Post-op Assessment: Report given to RN and Post -op Vital signs reviewed and stable ? ?Post vital signs: Reviewed and stable ? ?Last Vitals:  ?Vitals Value Taken Time  ?BP 134/60 07/05/21 1142  ?Temp    ?Pulse 71 07/05/21 1144  ?Resp 16 07/05/21 1144  ?SpO2 99 % 07/05/21 1144  ?Vitals shown include unvalidated device data. ? ?Last Pain:  ?Vitals:  ? 07/05/21 0837  ?TempSrc:   ?PainSc: 0-No pain  ?   ? ?  ? ?Complications: No notable events documented. ?

## 2021-07-05 NOTE — Anesthesia Postprocedure Evaluation (Signed)
Anesthesia Post Note ? ?Patient: [name] ? ?Procedure(s) Performed: TOTAL THYROIDECTOMY ? ?  ? ?Patient location during evaluation: PACU ?Anesthesia Type: General ?Level of consciousness: awake and alert, oriented and patient cooperative ?Pain management: pain level controlled ?Vital Signs Assessment: post-procedure vital signs reviewed and stable ?Respiratory status: spontaneous breathing, nonlabored ventilation and respiratory function stable ?Cardiovascular status: blood pressure returned to baseline and stable ?Postop Assessment: no apparent nausea or vomiting ?Anesthetic complications: no ? ? ?No notable events documented. ? ?Last Vitals:  ?Vitals:  ? 07/05/21 1211 07/05/21 1215  ?BP:  (!) 155/70  ?Pulse: 73 69  ?Resp: 13 12  ?Temp:    ?SpO2: 100% 99%  ?  ?Last Pain:  ?Vitals:  ? 07/05/21 1215  ?TempSrc:   ?PainSc: 5   ? ? ?  ?  ?  ?  ?  ?  ? ?Jarome Matin Donnell Beauchamp ? ? ? ? ?

## 2021-07-05 NOTE — Op Note (Signed)
Preoperative diagnosis: multinodular thyroid ? ?Postoperative diagnosis: same  ? ?Procedure: total thyroidectomy ? ?Surgeon: Gurney Maxin, M.D. ? ?Asst: Nedra Hai, M.D. ? ?Anesthesia: general ? ?Indications for procedure: [name] is a 66 y.o. year old female with symptoms of enlarging neck mass. On work up she was found to have a 4 cm right mid thyroid nodule bethesda V and a 3 cm left mid thyroid nodule bethesda II. She presents for thyroid resection. ? ?Description of procedure: The patient was brought to the operating room and placed in a supine position on the operating room table. Following administration of general anesthesia, the patient was positioned and then prepped and draped in the usual aseptic fashion.  ? ?A small Kocher incision was made. Dissection was carried through subcutaneous tissues and platysma. Hemostasis was achieved with the electrocautery. Skin flaps were elevated cephalad and caudad from the thyroid notch to the sternal notch. A [place] self-retaining retractor was placed for exposure. Strap muscles were incised in the midline and dissection was begun on the right side.  Strap muscles were reflected laterally. Right thyroid lobe was large and firm. Additionally there was a firm lower posterior nodule.  The right lobe was gently mobilized with blunt dissection. Superior pole vessels were dissected out and divided individually between small and medium ligaclips with the harmonic scalpel. The thyroid lobe was rolled anteriorly. Branches of the inferior thyroid artery were divided between small ligaclips with the harmonic scalpel. Inferior venous tributaries were divided between ligaclips. Both the superior and inferior parathyroid glands were identified and preserved on their vascular pedicles. The recurrent laryngeal nerve was identified and preserved along its course. The ligament of Gwenlyn Found was released with the electrocautery and the gland was mobilized onto the anterior  trachea. Isthmus was mobilized across the midline. There was small pyramidal lobe present It was dissected free with ligasure. Dry pack was placed in the left neck. ? ?The left thyroid lobe was gently mobilized with blunt dissection. Left thyroid lobe was smaller with a soft nodule in the mid body. Superior pole vessels were dissected out and divided between small and medium ligaclips with the Harmonic scalpel. Superior parathyroid was identified and preserved. Inferior venous tributaries were divided between medium ligaclips with the harmonic scalpel. The left  thyroid lobe was rolled anteriorly and the branches of the inferior thyroid artery divided between small ligaclips. The recurrent laryngeal nerve was identified and preserved along its course. The ligament of Gwenlyn Found was released with the electrocautery. The thyroid lobe was mobilized onto the anterior trachea and the remainder of the thyroid was dissected off the anterior trachea and the thyroid was completely excised. A suture was used to mark the left lobe. The entire thyroid gland was submitted to pathology for review. ? ?The neck was irrigated with warm saline. Fibrillar was placed throughout the operative field. Strap muscles were approximated in the midline with interrupted 3-0 Vicryl sutures. Platysma was closed with interrupted 3-0 Vicryl sutures. Skin was closed with a running 4-0 Monocryl subcuticular suture. Wound was washed and Dermabond was applied. The patient was awakened from anesthesia and brought to the recovery room. The patient tolerated the procedure well. ? ?Findings: large right thyroid lobe with firm nodules ? ?Specimen: thyroid, stitch marks right superior ? ?Implant: fibrillar  ? ?Blood loss: 50 ml ? ?Local anesthesia:  10 ml marcaine  ? ?Complications: none ? ?Gurney Maxin, M.D. ?General, Bariatric, & Minimally Invasive Surgery ?[facility], [place] ? ?

## 2021-07-05 NOTE — Anesthesia Procedure Notes (Signed)
Procedure Name: Intubation ?Date/Time: 07/05/2021 9:39 AM ?Performed by: Rivka Baune D, CRNA ?Pre-anesthesia Checklist: Patient identified, Emergency Drugs available, Suction available and Patient being monitored ?Patient Re-evaluated:Patient Re-evaluated prior to induction ?Oxygen Delivery Method: Circle system utilized ?Preoxygenation: Pre-oxygenation with 100% oxygen ?Induction Type: IV induction ?Ventilation: Mask ventilation without difficulty ?Laryngoscope Size: Mac and 4 ?Grade View: Grade I ?Tube type: Oral ?Tube size: 7.0 mm ?Number of attempts: 1 ?Airway Equipment and Method: Stylet ?Placement Confirmation: ETT inserted through vocal cords under direct vision, positive ETCO2 and breath sounds checked- equal and bilateral ?Secured at: 21 cm ?Tube secured with: Tape ?Dental Injury: Teeth and Oropharynx as per pre-operative assessment  ? ? ? ? ?

## 2021-07-05 NOTE — H&P (Signed)
Chief Complaint: Follow-up (Follow up discuss thyroid sx and afirma results ) ? ? ?History of Present Illness: ?[name] is a 66 y.o. female who is seen today for thyroid biopsy results. ? ?Her [place] was V showing high likelihood of malignancy. Interestingly, her affirma was inadequate. ? ?Review of Systems: ?A complete review of systems was obtained from the patient. I have reviewed this information and discussed as appropriate with the patient. See HPI as well for other ROS. ? ?Review of Systems  ?Constitutional: Negative.  ?HENT: Negative.  ?Eyes: Negative.  ?Respiratory: Positive for shortness of breath.  ?Cardiovascular: Negative.  ?Gastrointestinal: Negative.  ?Genitourinary: Negative.  ?Musculoskeletal: Negative.  ?Skin: Negative.  ?Neurological: Negative.  ?Endo/Heme/Allergies: Negative.  ?Psychiatric/Behavioral: Negative.  ? ? ?Medical History: ?Past Medical History:  ?Diagnosis Date  ? Anemia  ? Asthma, unspecified asthma severity, unspecified whether complicated, unspecified whether persistent  ? Hypertension  ? Thyroid disease  ? ?There is no problem list on file for this patient. ? ?Past Surgical History:  ?Procedure Laterality Date  ? foot surgery N/A  ? gallbladder remova lsurgery  ? hip replacement surgery Bilateral  ? knee surgery N/A  ? left shoulder replacement N/A  ? right shoulder eplacement Right  ? ? ?Allergies  ?Allergen Reactions  ? Flavoring Agent Anaphylaxis  ?Specifically mango peel , patient can eat the fruit itself  ? Oxycodone Dizziness  ?Dizziness ? ? ?Current Outpatient Medications on File Prior to Visit  ?Medication Sig Dispense Refill  ? albuterol 90 mcg/actuation inhaler albuterol sulfate HFA 90 mcg/actuation aerosol inhaler  ? ascorbic acid, vitamin C, (VITAMIN C) 1000 MG tablet Take by mouth  ? calcium carbonate (TUMS) 200 mg calcium (500 mg) chewable tablet Take by mouth  ? celecoxib (CELEBREX) 200 MG capsule celecoxib 200 mg capsule  ? cetirizine (ZYRTEC) 10 MG tablet  Take 10 mg by mouth 2 (two) times daily  ? cholecalciferol (VITAMIN D3) 2,000 unit tablet Take by mouth  ? ferrous sulfate 325 (65 FE) MG tablet  ? fluticasone propion-salmeteroL (ADVAIR DISKUS) 250-50 mcg/dose diskus inhaler Advair Diskus 250 mcg-50 mcg/dose powder for inhalation  ? hydroCHLOROthiazide (HYDRODIURIL) 25 MG tablet hydrochlorothiazide 25 mg tablet  ? magnesium oxide (MAG-OX) 400 mg (241.3 mg magnesium) tablet Take by mouth  ? omeprazole (PRILOSEC) 20 MG DR capsule omeprazole 20 mg capsule,delayed release  ? sertraline (ZOLOFT) 100 MG tablet sertraline 100 mg tablet  ? ?No current facility-administered medications on file prior to visit.  ? ?Family History  ?Problem Relation Age of Onset  ? High blood pressure (Hypertension) Mother  ? Diabetes Mother  ? Obesity Father  ? Heart valve disease Father  ? High blood pressure (Hypertension) Sister  ? ? ?Social History  ? ?Tobacco Use  ?Smoking Status Former  ? Types: Cigarettes  ?Smokeless Tobacco Never  ? ? ?Social History  ? ?Socioeconomic History  ? Marital status: Married  ?Tobacco Use  ? Smoking status: Former  ?Types: Cigarettes  ? Smokeless tobacco: Never  ?Vaping Use  ? Vaping Use: Never used  ?Substance and Sexual Activity  ? Alcohol use: Yes  ? Drug use: Never  ? ?Objective:  ? ?There were no vitals filed for this visit.  ?There is no height or weight on file to calculate BMI. ? ?Physical Exam ?Constitutional:  ?Appearance: Normal appearance.  ?HENT:  ?Head: Normocephalic and atraumatic.  ?Pulmonary:  ?Effort: Pulmonary effort is normal.  ?Musculoskeletal:  ?General: Normal range of motion.  ?Cervical back: Normal range of motion.  ?  Neurological:  ?General: No focal deficit present.  ?Mental Status: She is alert and oriented to person, place, and time. Mental status is at baseline.  ?Psychiatric:  ?Mood and Affect: Mood normal.  ?Behavior: Behavior normal.  ?Thought Content: Thought content normal.  ? ? ?Assessment and Plan:  ? ?Diagnoses and all  orders for this visit: ? ?Multiple thyroid nodules ? ? ?The anatomy and physiology of the thyroid gland and organs of the neck were discussed. Pathophysiology of thyroid problems were discussed. Options were discussed, and I made a recommendation to remove all of the thyroid gland to treat the pathology.  ? ?Risks of bleeding, infection, injury to other organs including nerves, reoperation, death, and other risks were discussed. I noted a good likelihood this will help address the problem. While there are risks, I feel the risks of nonoperative management are greater; therefore, I feel surgery offers the best option. Educational material was given to help further explain the topics & concerns from our discussion. We will work to minimize complications.  ? ?We discussed waiting 2-4 months after thyroid surgery before hiatal hernia repair  ?

## 2021-07-06 ENCOUNTER — Encounter (HOSPITAL_COMMUNITY): Payer: Self-pay | Admitting: General Surgery

## 2021-07-06 DIAGNOSIS — I10 Essential (primary) hypertension: Secondary | ICD-10-CM | POA: Diagnosis not present

## 2021-07-06 DIAGNOSIS — J45909 Unspecified asthma, uncomplicated: Secondary | ICD-10-CM | POA: Diagnosis not present

## 2021-07-06 DIAGNOSIS — C73 Malignant neoplasm of thyroid gland: Secondary | ICD-10-CM | POA: Diagnosis not present

## 2021-07-06 DIAGNOSIS — Z87891 Personal history of nicotine dependence: Secondary | ICD-10-CM | POA: Diagnosis not present

## 2021-07-06 DIAGNOSIS — Z79899 Other long term (current) drug therapy: Secondary | ICD-10-CM | POA: Diagnosis not present

## 2021-07-06 LAB — BASIC METABOLIC PANEL
Anion gap: 10 (ref 5–15)
BUN: 17 mg/dL (ref 8–23)
CO2: 27 mmol/L (ref 22–32)
Calcium: 9 mg/dL (ref 8.9–10.3)
Chloride: 103 mmol/L (ref 98–111)
Creatinine, Ser: 0.68 mg/dL (ref 0.44–1.00)
GFR, Estimated: >60 mL/min (ref >60)
Glucose, Bld: 131 mg/dL — ABNORMAL HIGH (ref 70–99)
Potassium: 4.4 mmol/L (ref 3.5–5.1)
Sodium: 140 mmol/L (ref 135–145)

## 2021-07-06 LAB — CBC
HCT: 38.5 % (ref 36.0–46.0)
Hemoglobin: 12.3 g/dL (ref 12.0–15.0)
MCH: 29.1 pg (ref 26.0–34.0)
MCHC: 31.9 g/dL (ref 30.0–36.0)
MCV: 91 fL (ref 80.0–100.0)
Platelets: 233 10*3/uL (ref 150–400)
RBC: 4.23 MIL/uL (ref 3.87–5.11)
RDW: 14.2 % (ref 11.5–15.5)
WBC: 10.2 10*3/uL (ref 4.0–10.5)
nRBC: 0 % (ref 0.0–0.2)

## 2021-07-06 LAB — CALCIUM, IONIZED: Calcium, Ionized, Serum: 4.7 mg/dL (ref 4.5–5.6)

## 2021-07-06 LAB — SURGICAL PATHOLOGY

## 2021-07-06 MED ORDER — CALCIUM CARBONATE 1250 (500 CA) MG PO TABS: 2.0000 | ORAL_TABLET | Freq: Two times a day (BID) | ORAL | 0 refills | Status: DC

## 2021-07-06 MED ORDER — LEVOTHYROXINE SODIUM 100 MCG PO TABS: 100.0000 ug | ORAL_TABLET | Freq: Every day | ORAL | 3 refills | Status: DC

## 2021-07-06 MED ORDER — TRAMADOL HCL 50 MG PO TABS: 50.0000 mg | ORAL_TABLET | Freq: Three times a day (TID) | ORAL | 0 refills | Status: AC | PRN

## 2021-07-06 NOTE — Progress Notes (Signed)
Discharge instructions discussed with patient and family, verbalized agreement and understanding 

## 2021-07-06 NOTE — Progress Notes (Signed)
Transition of Care (TOC) Screening Note ? ?Patient Details  ?Name: [name] ?Date of Birth: 06/16/1955 ? ?Transition of Care (TOC) CM/SW Contact:    ?Sherie Don, LCSW ?Phone Number: ?07/06/2021, 9:33 AM ? ?Transition of Care Department [facility]) has reviewed patient and no TOC needs have been identified at this time. We will continue to monitor patient advancement through interdisciplinary progression rounds. If new patient transition needs arise, please place a TOC consult. ?

## 2021-07-06 NOTE — Discharge Instructions (Signed)
[facility], [place] ?[phone] ? ?THYROID/ PARATHYROID SURGERY: POST OP INSTRUCTIONS ? ?Always review your discharge instruction sheet given to you by the facility where your surgery was performed. ? ?IF YOU HAVE DISABILITY OR FAMILY LEAVE FORMS, YOU MUST BRING THEM TO THE OFFICE FOR PROCESSING.  PLEASE DO NOT GIVE THEM TO YOUR DOCTOR. ? ?A prescription for pain medication may be given to you upon discharge.  Take your pain medication as prescribed, if needed.  If narcotic pain medicine is not needed, then you may take acetaminophen (Tylenol) or ibuprofen (Advil) as needed. ?Take your usually prescribed medications unless otherwise directed. ?If you need a refill on your pain medication, please contact your pharmacy. They will contact our office to request authorization.  Prescriptions will not be filled after 5pm or on week-ends. ?You should follow a light diet the first 24 hours after arrival home, such as soup and crackers, etc.  Be sure to include lots of fluids daily.  Resume your normal diet the day after surgery. ?Most patients will experience some swelling and bruising on the chest and neck area.  Ice packs will help.  Swelling and bruising can take several days to resolve.  ?It is common to experience some constipation if taking pain medication after surgery.  Increasing fluid intake and taking a stool softener will usually help or prevent this problem from occurring.  A mild laxative (Milk of Magnesia or Miralax) should be taken according to package directions if there are no bowel movements after 48 hours. ?Unless discharge instructions indicate otherwise, you may remove your bandages 24-48 hours after surgery, and you may shower at that time.  If your surgeon used skin glue on the incision, you may shower in now.  The glue will flake off over the next 2-3 weeks.  Any sutures or staples will be removed at the office during your follow-up visit. ?ACTIVITIES:  You may resume regular  (light) daily activities beginning the next day--such as daily self-care, walking, climbing stairs--gradually increasing activities as tolerated.  You may have sexual intercourse when it is comfortable.  Refrain from any heavy lifting or straining until approved by your doctor. ?You may drive when you no longer are taking prescription pain medication, you can comfortably wear a seatbelt, and you can safely maneuver your car and apply brakes ?RETURN TO WORK:  __________________________________________________________ ?You should see your doctor in the office for a follow-up appointment approximately two weeks after your surgery.  Make sure that you call for this appointment within a day or two after you arrive home to insure a convenient appointment time. ?OTHER INSTRUCTIONS: ____________________________________________________________________________ _________________________________________________________________________________________________________________ ?_________________________________________________________________________________________________________________ ? ? ?WHEN TO CALL YOUR DOCTOR: ?Fever over 101.0 ?Inability to urinate ?Nausea and/or vomiting ?Extreme swelling or bruising ?Continued bleeding from incision. ?Increased pain, redness, or drainage from the incision. ?Difficulty swallowing or breathing ?Muscle cramping or spasms. ?Numbness or tingling in hands or feet or around lips. ? ?The clinic staff is available to answer your questions during regular business hours.  Please don?t hesitate to call and ask to speak to one of the nurses if you have concerns. ? ?For further questions, please visit [email or web address]  ?

## 2021-07-06 NOTE — Discharge Summary (Signed)
?Physician Discharge Summary  ?[name]:[id number] DOB: May 28, 1955 DOA: 07/05/2021 ? ?PCP: Orma Render, NP ? ?Admit date: 07/05/2021 ?Discharge date: 07/06/2021 ? ?Recommendations for Outpatient Follow-up:  ? Lab check in 2 weeks(include homehealth, outpatient follow-up instructions, specific recommendations for PCP to follow-up on, etc.) ? ? Follow-up Information   ? ? [name], [name], MD Follow up in 3 week(s).   ?Specialty: General Surgery ?Contact information: ?[address] ?[address] ?[place] [address] ?[phone] ? ? ?  ?  ? ?  ?  ? ?  ? ?Discharge Diagnoses:  ?Principal Problem: ?  Thyroid mass ? ? ?Surgical Procedure: thyroidectomy ? ?Discharge Condition: Good ?Disposition: Home ? ?Diet recommendation: carb modified diet ? ? ?Hospital Course:  ?66 yo female underwent thyroidectomy. She was watched overnight. She had some superficial bruising in the lower neck. She had no hoarseness. She tolerated a diet well and was discharged home POD 1. ? ?Discharge Instructions ? ?Discharge Instructions   ? ? Call MD for:  persistant nausea and vomiting   Complete by: As directed ?  ? Call MD for:  severe uncontrolled pain   Complete by: As directed ?  ? Call MD for:  temperature >100.4   Complete by: As directed ?  ? Diet - low sodium heart healthy   Complete by: As directed ?  ? Increase activity slowly   Complete by: As directed ?  ? ?  ? ?Allergies as of 07/06/2021   ? ?   Reactions  ? Mango Flavor [flavoring Agent] Anaphylaxis  ? Specifically mango peel , patient can eat the fruit itself  ? Oxycontin [oxycodone]   ? Dizziness  ? ?  ? ?  ?Medication List  ?  ? ?STOP taking these medications   ? ?calcium carbonate 500 MG chewable tablet ?Commonly known as: TUMS - dosed in mg elemental calcium ?  ?metoprolol tartrate 25 MG tablet ?Commonly known as: LOPRESSOR ?  ?Vitamin D 50 MCG (2000 UT) tablet ?  ? ?  ? ?TAKE these medications   ? ?albuterol 108 (90 Base) MCG/ACT inhaler ?Commonly known as: VENTOLIN  HFA ?Inhale 2 puffs into the lungs every 6 (six) hours as needed for wheezing or shortness of breath. ?  ?Breztri Aerosphere 160-9-4.8 MCG/ACT Aero ?Generic drug: Budeson-Glycopyrrol-Formoterol ?Inhale 2 puffs into the lungs in the morning and at bedtime. ?  ?calcium carbonate 1250 (500 Ca) MG tablet ?Commonly known as: OS-CAL - dosed in mg of elemental calcium ?Take 2 tablets (1,000 mg of elemental calcium total) by mouth 2 (two) times daily with a meal for 60 doses. ?  ?cetirizine 10 MG tablet ?Commonly known as: ZYRTEC ?Take 1 tablet (10 mg total) by mouth 2 (two) times daily. ?What changed:  ?when to take this ?additional instructions ?  ?ferrous sulfate 325 (65 FE) MG EC tablet ?Take 2 tablets (650 mg total) by mouth daily. Take with food to avoid stomach upset. ?What changed: how much to take ?  ?fluticasone 50 MCG/ACT nasal spray ?Commonly known as: FLONASE ?Place 1 spray into both nostrils daily. ?  ?hydrochlorothiazide 25 MG tablet ?Commonly known as: HYDRODIURIL ?Take 1 tablet (25 mg total) by mouth every morning. ?  ?ibuprofen 200 MG tablet ?Commonly known as: ADVIL ?Take 400 mg by mouth every 6 (six) hours as needed for moderate pain. ?  ?levothyroxine 100 MCG tablet ?Commonly known as: Synthroid ?Take 1 tablet (100 mcg total) by mouth daily before breakfast. ?  ?magnesium oxide 400  MG tablet ?Commonly known as: MAG-OX ?Take 400 mg by mouth daily. ?  ?omeprazole 20 MG capsule ?Commonly known as: PRILOSEC ?TAKE ONE CAPSULE BY MOUTH DAILY 30 - [place] ?What changed: See the new instructions. ?  ?ondansetron 4 MG disintegrating tablet ?Commonly known as: ZOFRAN-ODT ?Take 4 mg by mouth every 8 (eight) hours as needed for vomiting or nausea. ?  ?sertraline 100 MG tablet ?Commonly known as: ZOLOFT ?Take 1.5 tablets (150 mg total) by mouth every morning. ?  ?SOLARCAINE EX ?Apply 1 application. topically daily as needed (bug bites). ?  ?traMADol 50 MG tablet ?Commonly known as: ULTRAM ?Take 1  tablet (50 mg total) by mouth every 8 (eight) hours as needed for up to 5 days. ?  ?vitamin C 1000 MG tablet ?Take 1,000 mg by mouth daily. ?  ? ?  ? ? Follow-up Information   ? ? [name], [name], MD Follow up in 3 week(s).   ?Specialty: General Surgery ?Contact information: ?[address] ?[address] ?[place] [address] ?[phone] ? ? ?  ?  ? ?  ?  ? ?  ? ? ? ?The results of significant diagnostics from this hospitalization (including imaging, microbiology, ancillary and laboratory) are listed below for reference.   ? ?Significant Diagnostic Studies: ?DG Chest 2 View per protocol ? ?Result Date: 06/26/2021 ?CLINICAL DATA:  66 year old female with preop chest x-ray EXAM: CHEST - 2 VIEW COMPARISON:  06/19/2013 FINDINGS: Cardiomediastinal silhouette unchanged in size and contour. No evidence of central vascular congestion. No interlobular septal thickening. New double density projecting over the lower mediastinum. Low lung volumes. No pneumothorax or pleural effusion. Coarsened interstitial markings, with no confluent airspace disease. No acute displaced fracture. Degenerative changes of the spine. Surgical changes of the shoulders IMPRESSION: Negative for acute cardiopulmonary disease. Hiatal hernia Electronically Signed   By: Corrie Mckusick D.O.   On: 06/26/2021 14:27  ? ?CT CORONARY MORPH W/CTA COR W/SCORE W/CA W/CM &/OR WO/CM ? ?Addendum Date: 07/03/2021   ?ADDENDUM REPORT: 07/03/2021 08:58 EXAM: OVER-READ INTERPRETATION  CT CHEST The following report is an over-read performed by radiologist Dr. Nettie Elm [facility] on 07/03/2021. This over-read does not include interpretation of cardiac or coronary anatomy or pathology. The coronary calcium score/coronary CTA interpretation by the cardiologist is attached. COMPARISON:  CT chest dated April 06, 2021. FINDINGS: Cardiovascular: There are no significant vascular findings. There is no pericardial effusion. Mediastinum/Nodes: There are no  enlarged lymph nodes.Unchanged large hiatal hernia. The visualized esophagus demonstrates no significant findings. Lungs/Pleura: Mild subsegmental atelectasis/scarring at both lung bases. No focal consolidation, pleural effusion, or pneumothorax. Upper abdomen: No acute abnormality. Musculoskeletal/Chest wall: No chest wall abnormality. No acute or significant osseous findings. IMPRESSION: 1. Unchanged large hiatal hernia. Electronically Signed   By: Titus Dubin M.D.   On: 07/03/2021 08:58  ? ?Result Date: 07/03/2021 ?CLINICAL DATA:  Chest pain EXAM: Cardiac CTA MEDICATIONS: Sub lingual nitro. '4mg'$  x 2 TECHNIQUE: The patient was scanned on a Siemens 774 slice scanner. Gantry rotation speed was 250 msecs. Collimation was 0.6 mm. A 100 kV prospective scan was triggered in the ascending thoracic aorta at 35-75% of the R-R interval. Average HR during the scan was 60 bpm. The 3D data set was interpreted on a dedicated work station using MPR, MIP and VRT modes. A total of 80cc of contrast was used. FINDINGS: Non-cardiac: See separate report from [facility]. Pulmonary veins drain normally to the left atrium. No LA appendage thrombus.  Calcium Score: 18.4 Agatston units. Coronary Arteries: Right dominant with no anomalies LM: No plaque or stenosis. LAD system: Small area of calcified plaque in the proximal LAD, minimal stenosis. Circumflex system: No plaque or stenosis. RCA system: No plaque or stenosis. IMPRESSION: 1. Coronary artery calcium score 18.4 Agatston units. This places the patient in the 59th percentile for age and gender, suggesting intermediate risk for future cardiac events. 2.  Minimal coronary disease. 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 On: 07/02/2021 17:02  ? ?ECHOCARDIOGRAM COMPLETE ? ?Result Date: 06/29/2021 ?   ECHOCARDIOGRAM REPORT   Patient Name:   [name] Date of Exam: 06/29/2021 Medical Rec #:  [id number]       Height:       65.0 in Accession #:    [id number]       Weight:       206.0 lb Date of Birth:  1955-05-31        BSA:          2.003 m? Patient Age:    2 years        BP:           125/75 mmHg Patient Gender: F               HR:           63 bpm. Exam [place]

## 2021-07-10 ENCOUNTER — Other Ambulatory Visit (HOSPITAL_BASED_OUTPATIENT_CLINIC_OR_DEPARTMENT_OTHER): Payer: Self-pay | Admitting: Nurse Practitioner

## 2021-07-11 ENCOUNTER — Encounter (HOSPITAL_BASED_OUTPATIENT_CLINIC_OR_DEPARTMENT_OTHER): Payer: Self-pay | Admitting: Nurse Practitioner

## 2021-07-17 ENCOUNTER — Other Ambulatory Visit (HOSPITAL_BASED_OUTPATIENT_CLINIC_OR_DEPARTMENT_OTHER): Payer: Self-pay | Admitting: Nurse Practitioner

## 2021-07-20 DIAGNOSIS — E042 Nontoxic multinodular goiter: Secondary | ICD-10-CM | POA: Diagnosis not present

## 2021-07-23 ENCOUNTER — Ambulatory Visit: Payer: Self-pay | Admitting: Allergy

## 2021-07-26 ENCOUNTER — Other Ambulatory Visit: Payer: Self-pay | Admitting: General Surgery

## 2021-07-26 DIAGNOSIS — K449 Diaphragmatic hernia without obstruction or gangrene: Secondary | ICD-10-CM

## 2021-08-02 ENCOUNTER — Ambulatory Visit
Admission: RE | Admit: 2021-08-02 | Discharge: 2021-08-02 | Disposition: A | Discharge status: Home / Self Care | Payer: Medicare PPO | Source: Ambulatory Visit | Attending: General Surgery | Admitting: General Surgery

## 2021-08-02 DIAGNOSIS — R1310 Dysphagia, unspecified: Secondary | ICD-10-CM | POA: Diagnosis not present

## 2021-08-02 DIAGNOSIS — K449 Diaphragmatic hernia without obstruction or gangrene: Secondary | ICD-10-CM

## 2021-08-02 DIAGNOSIS — K224 Dyskinesia of esophagus: Secondary | ICD-10-CM | POA: Diagnosis not present

## 2021-08-02 DIAGNOSIS — K219 Gastro-esophageal reflux disease without esophagitis: Secondary | ICD-10-CM | POA: Diagnosis not present

## 2021-08-07 NOTE — Patient Instructions (Signed)
DUE TO COVID-19 ONLY TWO VISITORS  (aged 67 and older)  ARE ALLOWED TO COME WITH YOU AND STAY IN THE WAITING ROOM ONLY DURING PRE OP AND PROCEDURE.   **NO VISITORS ARE ALLOWED IN THE SHORT STAY AREA OR RECOVERY ROOM!!**  IF YOU WILL BE ADMITTED INTO THE HOSPITAL YOU ARE ALLOWED ONLY FOUR SUPPORT PEOPLE DURING VISITATION HOURS ONLY (7 AM -8PM)   The support person(s) must pass our screening, gel in and out, and wear a mask at all times, including in the patient's room. Patients must also wear a mask when staff or their support person are in the room. Visitors GUEST BADGE MUST BE WORN VISIBLY  One adult visitor may remain with you overnight and MUST be in the room by 8 P.M.     Your procedure is scheduled on: 08/20/21   Report to [facility] Main Entrance    Report to admitting at: 10:30 AM   Call this number if you have problems the morning of surgery [phone] Eat a light diet the day before surgery.  Examples including soups, broths, toast, yogurt, mashed potatoes.  Things to avoid include carbonated beverages (fizzy beverages), raw fruits and raw vegetables, or beans.   If your bowels are filled with gas, your surgeon will have difficulty visualizing your pelvic organs which increases your surgical risks.    Do not eat food :After Midnight.   After Midnight you may have the following liquids until : 9:30 AM DAY OF SURGERY  Water Black Coffee (sugar ok, NO MILK/CREAM OR CREAMERS)  Tea (sugar ok, NO MILK/CREAM OR CREAMERS) regular and decaf                             Plain Jell-O (NO RED)                                           Fruit ices (not with fruit pulp, NO RED)                                     Popsicles (NO RED)                                                                  Juice: apple, WHITE grape, WHITE cranberry Sports drinks like Gatorade (NO RED) Clear broth(vegetable,chicken,beef)    Oral Hygiene is also important to reduce your risk of infection.                                     Remember - BRUSH YOUR TEETH THE MORNING OF SURGERY WITH YOUR REGULAR TOOTHPASTE   Do NOT smoke after Midnight   Take these medicines the morning of surgery with A SIP OF WATER: sertraline,synthroid.Use inhalers and flonase as usual. Cetirizine as needed.  DO NOT TAKE ANY ORAL DIABETIC MEDICATIONS DAY OF YOUR SURGERY  Bring CPAP mask and tubing day of surgery.  You may not have any metal on your body including hair pins, jewelry, and body piercing             Do not wear make-up, lotions, powders, perfumes/cologne, or deodorant  Do not wear nail polish including gel and S&S, artificial/acrylic nails, or any other type of covering on natural nails including finger and toenails. If you have artificial nails, gel coating, etc. that needs to be removed by a nail salon please have this removed prior to surgery or surgery may need to be canceled/ delayed if the surgeon/ anesthesia feels like they are unable to be safely monitored.   Do not shave  48 hours prior to surgery.    Do not bring valuables to the hospital. [place].   Contacts, dentures or bridgework may not be worn into surgery.   Bring small overnight bag day of surgery.   DO NOT [place]. PHARMACY WILL DISPENSE MEDICATIONS LISTED ON YOUR MEDICATION LIST TO YOU DURING YOUR ADMISSION [place]!    Patients discharged on the day of surgery will not be allowed to drive home.  Someone NEEDS to stay with you for the first 24 hours after anesthesia.   Special Instructions: Bring a copy of your healthcare power of attorney and living will documents         the day of surgery if you haven't scanned them before.              Please read over the following fact sheets you were given: IF YOU HAVE QUESTIONS ABOUT YOUR PRE-OP INSTRUCTIONS PLEASE CALL [phone]     [facility] - Preparing  for Surgery Before surgery, you can play an important role.  Because skin is not sterile, your skin needs to be as free of germs as possible.  You can reduce the number of germs on your skin by washing with CHG (chlorahexidine gluconate) soap before surgery.  CHG is an antiseptic cleaner which kills germs and bonds with the skin to continue killing germs even after washing. Please DO NOT use if you have an allergy to CHG or antibacterial soaps.  If your skin becomes reddened/irritated stop using the CHG and inform your nurse when you arrive at Short Stay. Do not shave (including legs and underarms) for at least 48 hours prior to the first CHG shower.  You may shave your face/neck. Please follow these instructions carefully:  1.  Shower with CHG Soap the night before surgery and the  morning of Surgery.  2.  If you choose to wash your hair, wash your hair first as usual with your  normal  shampoo.  3.  After you shampoo, rinse your hair and body thoroughly to remove the  shampoo.                           4.  Use CHG as you would any other liquid soap.  You can apply chg directly  to the skin and wash                       Gently with a scrungie or clean washcloth.  5.  Apply the CHG Soap to your body ONLY FROM THE NECK DOWN.   Do not use on face/ open  Wound or open sores. Avoid contact with eyes, ears mouth and genitals (private parts).                       Wash face,  Genitals (private parts) with your normal soap.             6.  Wash thoroughly, paying special attention to the area where your surgery  will be performed.  7.  Thoroughly rinse your body with warm water from the neck down.  8.  DO NOT shower/wash with your normal soap after using and rinsing off  the CHG Soap.                9.  Pat yourself dry with a clean towel.            10.  Wear clean pajamas.            11.  Place clean sheets on your bed the night of your first shower and do not  sleep with  pets. Day of Surgery : Do not apply any lotions/deodorants the morning of surgery.  Please wear clean clothes to the hospital/surgery center.  FAILURE TO FOLLOW THESE INSTRUCTIONS MAY RESULT IN THE CANCELLATION OF YOUR SURGERY PATIENT SIGNATURE_________________________________  NURSE SIGNATURE__________________________________  ________________________________________________________________________

## 2021-08-07 NOTE — Progress Notes (Signed)
Pt. Needs instructions for upcoming surgery.

## 2021-08-08 ENCOUNTER — Encounter (HOSPITAL_COMMUNITY)
Admission: RE | Admit: 2021-08-08 | Discharge: 2021-08-08 | Disposition: A | Discharge status: Home / Self Care | Payer: Medicare PPO | Source: Ambulatory Visit | Attending: General Surgery | Admitting: General Surgery

## 2021-08-08 ENCOUNTER — Other Ambulatory Visit: Payer: Self-pay

## 2021-08-08 ENCOUNTER — Encounter (HOSPITAL_COMMUNITY): Payer: Self-pay

## 2021-08-08 VITALS — BP 133/76 | HR 53 | Temp 97.8°F | Ht 65.0 in | Wt 208.0 lb

## 2021-08-08 DIAGNOSIS — Z01812 Encounter for preprocedural laboratory examination: Secondary | ICD-10-CM | POA: Diagnosis not present

## 2021-08-08 DIAGNOSIS — I2510 Atherosclerotic heart disease of native coronary artery without angina pectoris: Secondary | ICD-10-CM | POA: Diagnosis not present

## 2021-08-08 HISTORY — DX: Malignant (primary) neoplasm, unspecified: C80.1

## 2021-08-08 HISTORY — DX: Dyspnea, unspecified: R06.00

## 2021-08-08 LAB — BASIC METABOLIC PANEL
Anion gap: 7 (ref 5–15)
BUN: 20 mg/dL (ref 8–23)
CO2: 27 mmol/L (ref 22–32)
Calcium: 9.2 mg/dL (ref 8.9–10.3)
Chloride: 106 mmol/L (ref 98–111)
Creatinine, Ser: 0.75 mg/dL (ref 0.44–1.00)
GFR, Estimated: >60 mL/min (ref >60)
Glucose, Bld: 91 mg/dL (ref 70–99)
Potassium: 4.8 mmol/L (ref 3.5–5.1)
Sodium: 140 mmol/L (ref 135–145)

## 2021-08-08 LAB — CBC
HCT: 41.2 % (ref 36.0–46.0)
Hemoglobin: 13.2 g/dL (ref 12.0–15.0)
MCH: 29.3 pg (ref 26.0–34.0)
MCHC: 32 g/dL (ref 30.0–36.0)
MCV: 91.6 fL (ref 80.0–100.0)
Platelets: 218 10*3/uL (ref 150–400)
RBC: 4.5 MIL/uL (ref 3.87–5.11)
RDW: 14.7 % (ref 11.5–15.5)
WBC: 6.9 10*3/uL (ref 4.0–10.5)
nRBC: 0 % (ref 0.0–0.2)

## 2021-08-08 NOTE — Progress Notes (Signed)
For Short Stay: [place] appointment date: Date of COVID positive in last 97 days:  Bowel Prep reminder:   For Anesthesia: PCP - Coralee Pesa Early: NP Cardiologist - Dr. Buford Dresser. LOV: 06/21/21  Chest x-ray - 06/26/21 EKG - 06/21/21 Stress Test -  ECHO - 06/29/21 Cardiac Cath -  Pacemaker/ICD device last checked: Pacemaker orders received: Device Rep notified:  Spinal Cord Stimulator:  Sleep Study -  CPAP -   Fasting Blood Sugar -  Checks Blood Sugar _____ times a day Date and result of last Hgb A1c-  Blood Thinner Instructions: Aspirin Instructions: Last Dose:  Activity level: Can go up a flight of stairs and activities of daily living without stopping and without chest pain and/or shortness of breath   Able to exercise without chest pain and/or shortness of breath   Unable to go up a flight of stairs without chest pain and/or shortness of breath     Anesthesia review: Hx: HTN,SOB  Patient denies shortness of breath, fever, cough and chest pain at PAT appointment   Patient verbalized understanding of instructions that were given to them at the PAT appointment. Patient was also instructed that they will need to review over the PAT instructions again at home before surgery.

## 2021-08-10 ENCOUNTER — Ambulatory Visit: Payer: Self-pay | Admitting: General Surgery

## 2021-08-20 ENCOUNTER — Ambulatory Visit (HOSPITAL_COMMUNITY): Payer: Medicare PPO | Admitting: Anesthesiology

## 2021-08-20 ENCOUNTER — Encounter (HOSPITAL_COMMUNITY)
Admission: RE | Disposition: A | Discharge status: Home / Self Care | Payer: Self-pay | Source: Ambulatory Visit | Attending: General Surgery

## 2021-08-20 ENCOUNTER — Observation Stay (HOSPITAL_COMMUNITY)
Admission: RE | Admit: 2021-08-20 | Discharge: 2021-08-21 | Disposition: A | Discharge status: Home / Self Care | Payer: Medicare PPO | Source: Ambulatory Visit | Attending: General Surgery | Admitting: General Surgery

## 2021-08-20 ENCOUNTER — Encounter (HOSPITAL_COMMUNITY): Payer: Self-pay | Admitting: General Surgery

## 2021-08-20 ENCOUNTER — Other Ambulatory Visit: Payer: Self-pay

## 2021-08-20 ENCOUNTER — Ambulatory Visit (HOSPITAL_BASED_OUTPATIENT_CLINIC_OR_DEPARTMENT_OTHER): Payer: Medicare PPO | Admitting: Anesthesiology

## 2021-08-20 DIAGNOSIS — Z96651 Presence of right artificial knee joint: Secondary | ICD-10-CM | POA: Insufficient documentation

## 2021-08-20 DIAGNOSIS — Z96642 Presence of left artificial hip joint: Secondary | ICD-10-CM | POA: Diagnosis not present

## 2021-08-20 DIAGNOSIS — I10 Essential (primary) hypertension: Secondary | ICD-10-CM | POA: Insufficient documentation

## 2021-08-20 DIAGNOSIS — Z87891 Personal history of nicotine dependence: Secondary | ICD-10-CM | POA: Diagnosis not present

## 2021-08-20 DIAGNOSIS — Z859 Personal history of malignant neoplasm, unspecified: Secondary | ICD-10-CM | POA: Diagnosis not present

## 2021-08-20 DIAGNOSIS — K449 Diaphragmatic hernia without obstruction or gangrene: Principal | ICD-10-CM

## 2021-08-20 DIAGNOSIS — K219 Gastro-esophageal reflux disease without esophagitis: Secondary | ICD-10-CM | POA: Insufficient documentation

## 2021-08-20 DIAGNOSIS — J45909 Unspecified asthma, uncomplicated: Secondary | ICD-10-CM | POA: Diagnosis not present

## 2021-08-20 HISTORY — PX: XI ROBOTIC ASSISTED HIATAL HERNIA REPAIR: SHX6889

## 2021-08-20 LAB — CBC
HCT: 41.3 % (ref 36.0–46.0)
Hemoglobin: 13.3 g/dL (ref 12.0–15.0)
MCH: 29.8 pg (ref 26.0–34.0)
MCHC: 32.2 g/dL (ref 30.0–36.0)
MCV: 92.4 fL (ref 80.0–100.0)
Platelets: 190 10*3/uL (ref 150–400)
RBC: 4.47 MIL/uL (ref 3.87–5.11)
RDW: 14.3 % (ref 11.5–15.5)
WBC: 12.1 10*3/uL — ABNORMAL HIGH (ref 4.0–10.5)
nRBC: 0 % (ref 0.0–0.2)

## 2021-08-20 LAB — CREATININE, SERUM
Creatinine, Ser: 0.93 mg/dL (ref 0.44–1.00)
GFR, Estimated: >60 mL/min (ref >60)

## 2021-08-20 SURGERY — REPAIR, HERNIA, HIATAL, ROBOT-ASSISTED
Anesthesia: General

## 2021-08-20 MED ORDER — ONDANSETRON HCL 4 MG/2ML IJ SOLN: 4.0000 mg | Freq: Once | INTRAMUSCULAR | Status: DC | PRN

## 2021-08-20 MED ORDER — LACTATED RINGERS IR SOLN
Status: DC | PRN
  Administered 2021-08-20: 1000 mL

## 2021-08-20 MED ORDER — PROPOFOL 10 MG/ML IV BOLUS
INTRAVENOUS | Status: AC
  Filled 2021-08-20: qty 20

## 2021-08-20 MED ORDER — KETOROLAC TROMETHAMINE 15 MG/ML IJ SOLN: 15.0000 mg | Freq: Four times a day (QID) | INTRAMUSCULAR | Status: DC | PRN

## 2021-08-20 MED ORDER — DEXTROSE-NACL 5-0.45 % IV SOLN: INTRAVENOUS | Status: DC

## 2021-08-20 MED ORDER — UMECLIDINIUM BROMIDE 62.5 MCG/ACT IN AEPB
1.0000 | INHALATION_SPRAY | Freq: Every day | RESPIRATORY_TRACT | Status: DC
  Administered 2021-08-21: 1 via RESPIRATORY_TRACT
  Filled 2021-08-20: qty 7

## 2021-08-20 MED ORDER — HYDRALAZINE HCL 20 MG/ML IJ SOLN: 10.0000 mg | INTRAMUSCULAR | Status: DC | PRN

## 2021-08-20 MED ORDER — HYDROMORPHONE HCL 1 MG/ML IJ SOLN
INTRAMUSCULAR | Status: AC
  Administered 2021-08-20: 0.25 mg via INTRAVENOUS
  Filled 2021-08-20: qty 2

## 2021-08-20 MED ORDER — CHLORHEXIDINE GLUCONATE 0.12 % MT SOLN
15.0000 mL | Freq: Once | OROMUCOSAL | Status: AC
  Administered 2021-08-20: 15 mL via OROMUCOSAL

## 2021-08-20 MED ORDER — METOPROLOL TARTRATE 5 MG/5ML IV SOLN: 5.0000 mg | Freq: Four times a day (QID) | INTRAVENOUS | Status: DC | PRN

## 2021-08-20 MED ORDER — ONDANSETRON HCL 4 MG/2ML IJ SOLN
INTRAMUSCULAR | Status: AC
  Filled 2021-08-20: qty 2

## 2021-08-20 MED ORDER — HYDROCODONE-ACETAMINOPHEN 7.5-325 MG PO TABS: 1.0000 | ORAL_TABLET | Freq: Once | ORAL | Status: DC | PRN

## 2021-08-20 MED ORDER — FLUTICASONE FUROATE-VILANTEROL 200-25 MCG/ACT IN AEPB
1.0000 | INHALATION_SPRAY | Freq: Every day | RESPIRATORY_TRACT | Status: DC
  Administered 2021-08-21: 1 via RESPIRATORY_TRACT
  Filled 2021-08-20: qty 28

## 2021-08-20 MED ORDER — FLUTICASONE PROPIONATE 50 MCG/ACT NA SUSP
1.0000 | Freq: Every day | NASAL | Status: DC
  Administered 2021-08-21: 1 via NASAL
  Filled 2021-08-20: qty 16

## 2021-08-20 MED ORDER — BUPIVACAINE-EPINEPHRINE (PF) 0.25% -1:200000 IJ SOLN
INTRAMUSCULAR | Status: AC
  Filled 2021-08-20: qty 30

## 2021-08-20 MED ORDER — ALBUTEROL SULFATE (2.5 MG/3ML) 0.083% IN NEBU
3.0000 mL | INHALATION_SOLUTION | Freq: Four times a day (QID) | RESPIRATORY_TRACT | Status: DC | PRN
Start: 2021-08-20 — End: 2021-08-21

## 2021-08-20 MED ORDER — SERTRALINE HCL 50 MG PO TABS
50.0000 mg | ORAL_TABLET | Freq: Every day | ORAL | Status: DC
Start: 2021-08-21 — End: 2021-08-21

## 2021-08-20 MED ORDER — SUGAMMADEX SODIUM 500 MG/5ML IV SOLN
INTRAVENOUS | Status: AC
  Filled 2021-08-20: qty 5

## 2021-08-20 MED ORDER — AMISULPRIDE (ANTIEMETIC) 5 MG/2ML IV SOLN: 10.0000 mg | Freq: Once | INTRAVENOUS | Status: DC | PRN

## 2021-08-20 MED ORDER — ACETAMINOPHEN 500 MG PO TABS
1000.0000 mg | ORAL_TABLET | ORAL | Status: AC
  Administered 2021-08-20: 1000 mg via ORAL
  Filled 2021-08-20: qty 2

## 2021-08-20 MED ORDER — FENTANYL CITRATE (PF) 250 MCG/5ML IJ SOLN
INTRAMUSCULAR | Status: DC | PRN
  Administered 2021-08-20 (×7): 50 ug via INTRAVENOUS
  Administered 2021-08-20: 100 ug via INTRAVENOUS
  Administered 2021-08-20: 50 ug via INTRAVENOUS

## 2021-08-20 MED ORDER — LACTATED RINGERS IV SOLN: INTRAVENOUS | Status: DC

## 2021-08-20 MED ORDER — MIDAZOLAM HCL 2 MG/2ML IJ SOLN
INTRAMUSCULAR | Status: AC
  Filled 2021-08-20: qty 2

## 2021-08-20 MED ORDER — FENTANYL CITRATE (PF) 250 MCG/5ML IJ SOLN
INTRAMUSCULAR | Status: AC
  Filled 2021-08-20: qty 5

## 2021-08-20 MED ORDER — BUPIVACAINE LIPOSOME 1.3 % IJ SUSP
INTRAMUSCULAR | Status: DC | PRN
  Administered 2021-08-20: 20 mL

## 2021-08-20 MED ORDER — HYDROMORPHONE HCL 1 MG/ML IJ SOLN
0.2500 mg | INTRAMUSCULAR | Status: DC | PRN
  Administered 2021-08-20 (×2): 0.25 mg via INTRAVENOUS
  Administered 2021-08-20: 0.5 mg via INTRAVENOUS

## 2021-08-20 MED ORDER — SUCCINYLCHOLINE CHLORIDE 200 MG/10ML IV SOSY
PREFILLED_SYRINGE | INTRAVENOUS | Status: DC | PRN
  Administered 2021-08-20: 120 mg via INTRAVENOUS

## 2021-08-20 MED ORDER — MORPHINE SULFATE (PF) 2 MG/ML IV SOLN: 2.0000 mg | INTRAVENOUS | Status: DC | PRN

## 2021-08-20 MED ORDER — DIPHENHYDRAMINE HCL 12.5 MG/5ML PO ELIX: 12.5000 mg | ORAL_SOLUTION | Freq: Four times a day (QID) | ORAL | Status: DC | PRN

## 2021-08-20 MED ORDER — ORAL CARE MOUTH RINSE: 15.0000 mL | Freq: Once | OROMUCOSAL | Status: AC

## 2021-08-20 MED ORDER — CEFAZOLIN SODIUM-DEXTROSE 2-4 GM/100ML-% IV SOLN
2.0000 g | INTRAVENOUS | Status: AC
  Administered 2021-08-20: 2 g via INTRAVENOUS
  Filled 2021-08-20: qty 100

## 2021-08-20 MED ORDER — ENOXAPARIN SODIUM 40 MG/0.4ML IJ SOSY
40.0000 mg | PREFILLED_SYRINGE | INTRAMUSCULAR | Status: DC
  Administered 2021-08-21: 40 mg via SUBCUTANEOUS
  Filled 2021-08-20: qty 0.4

## 2021-08-20 MED ORDER — CHLORHEXIDINE GLUCONATE CLOTH 2 % EX PADS: 6.0000 | MEDICATED_PAD | Freq: Once | CUTANEOUS | Status: DC

## 2021-08-20 MED ORDER — BUDESON-GLYCOPYRROL-FORMOTEROL 160-9-4.8 MCG/ACT IN AERO
2.0000 | INHALATION_SPRAY | Freq: Every day | RESPIRATORY_TRACT | Status: DC
Start: 2021-08-20 — End: 2021-08-20

## 2021-08-20 MED ORDER — ENSURE PRE-SURGERY PO LIQD
296.0000 mL | Freq: Once | ORAL | Status: DC
  Filled 2021-08-20: qty 296

## 2021-08-20 MED ORDER — EPHEDRINE SULFATE-NACL 50-0.9 MG/10ML-% IV SOSY
PREFILLED_SYRINGE | INTRAVENOUS | Status: DC | PRN
  Administered 2021-08-20: 5 mg via INTRAVENOUS
  Administered 2021-08-20: 10 mg via INTRAVENOUS
  Administered 2021-08-20 (×2): 5 mg via INTRAVENOUS

## 2021-08-20 MED ORDER — PROPOFOL 10 MG/ML IV BOLUS
INTRAVENOUS | Status: DC | PRN
  Administered 2021-08-20: 150 mg via INTRAVENOUS

## 2021-08-20 MED ORDER — ONDANSETRON HCL 4 MG/2ML IJ SOLN: 4.0000 mg | Freq: Four times a day (QID) | INTRAMUSCULAR | Status: DC | PRN

## 2021-08-20 MED ORDER — DEXAMETHASONE SODIUM PHOSPHATE 10 MG/ML IJ SOLN
INTRAMUSCULAR | Status: DC | PRN
  Administered 2021-08-20: 10 mg via INTRAVENOUS

## 2021-08-20 MED ORDER — ONDANSETRON HCL 4 MG/2ML IJ SOLN
INTRAMUSCULAR | Status: DC | PRN
  Administered 2021-08-20 (×2): 4 mg via INTRAVENOUS

## 2021-08-20 MED ORDER — HYDROCODONE-ACETAMINOPHEN 5-325 MG PO TABS
1.0000 | ORAL_TABLET | ORAL | Status: DC | PRN
  Administered 2021-08-20: 1 via ORAL
  Administered 2021-08-21 (×2): 2 via ORAL
  Filled 2021-08-20: qty 2
  Filled 2021-08-20 (×3): qty 1

## 2021-08-20 MED ORDER — ROCURONIUM BROMIDE 10 MG/ML (PF) SYRINGE
PREFILLED_SYRINGE | INTRAVENOUS | Status: DC | PRN
  Administered 2021-08-20: 20 mg via INTRAVENOUS
  Administered 2021-08-20: 70 mg via INTRAVENOUS
  Administered 2021-08-20: 10 mg via INTRAVENOUS

## 2021-08-20 MED ORDER — ONDANSETRON 4 MG PO TBDP: 4.0000 mg | ORAL_TABLET | Freq: Four times a day (QID) | ORAL | Status: DC | PRN

## 2021-08-20 MED ORDER — GLYCOPYRROLATE 0.2 MG/ML IJ SOLN
INTRAMUSCULAR | Status: DC | PRN
  Administered 2021-08-20 (×2): .1 mg via INTRAVENOUS

## 2021-08-20 MED ORDER — BUPIVACAINE LIPOSOME 1.3 % IJ SUSP: 20.0000 mL | Freq: Once | INTRAMUSCULAR | Status: DC

## 2021-08-20 MED ORDER — 0.9 % SODIUM CHLORIDE (POUR BTL) OPTIME
TOPICAL | Status: DC | PRN
  Administered 2021-08-20: 1000 mL

## 2021-08-20 MED ORDER — CELECOXIB 200 MG PO CAPS
400.0000 mg | ORAL_CAPSULE | ORAL | Status: AC
  Administered 2021-08-20: 400 mg via ORAL
  Filled 2021-08-20: qty 2

## 2021-08-20 MED ORDER — BUPIVACAINE LIPOSOME 1.3 % IJ SUSP
INTRAMUSCULAR | Status: AC
  Filled 2021-08-20: qty 20

## 2021-08-20 MED ORDER — BUPIVACAINE-EPINEPHRINE 0.25% -1:200000 IJ SOLN
INTRAMUSCULAR | Status: DC | PRN
  Administered 2021-08-20: 30 mL

## 2021-08-20 MED ORDER — LIDOCAINE 2% (20 MG/ML) 5 ML SYRINGE
INTRAMUSCULAR | Status: DC | PRN
  Administered 2021-08-20: 60 mg via INTRAVENOUS

## 2021-08-20 MED ORDER — SUGAMMADEX SODIUM 500 MG/5ML IV SOLN
INTRAVENOUS | Status: DC | PRN
  Administered 2021-08-20: 300 mg via INTRAVENOUS

## 2021-08-20 MED ORDER — MIDAZOLAM HCL 2 MG/2ML IJ SOLN
INTRAMUSCULAR | Status: DC | PRN
  Administered 2021-08-20: 1 mg via INTRAVENOUS

## 2021-08-20 MED ORDER — DIPHENHYDRAMINE HCL 50 MG/ML IJ SOLN: 12.5000 mg | Freq: Four times a day (QID) | INTRAMUSCULAR | Status: DC | PRN

## 2021-08-20 MED ORDER — ACETAMINOPHEN 500 MG PO TABS
1000.0000 mg | ORAL_TABLET | Freq: Four times a day (QID) | ORAL | Status: DC
  Administered 2021-08-20 – 2021-08-21 (×3): 1000 mg via ORAL
  Filled 2021-08-20 (×3): qty 2

## 2021-08-20 MED ORDER — PHENYLEPHRINE HCL (PRESSORS) 10 MG/ML IV SOLN
INTRAVENOUS | Status: AC
  Filled 2021-08-20: qty 1

## 2021-08-20 MED ORDER — PHENYLEPHRINE HCL-NACL 20-0.9 MG/250ML-% IV SOLN
INTRAVENOUS | Status: DC | PRN
  Administered 2021-08-20: 25 ug/min via INTRAVENOUS

## 2021-08-20 SURGICAL SUPPLY — 64 items
ADH SKN CLS APL DERMABOND .7 (GAUZE/BANDAGES/DRESSINGS) ×1
APL PRP STRL LF DISP 70% ISPRP (MISCELLANEOUS) ×1
APPLIER CLIP 5 13 M/L LIGAMAX5 (MISCELLANEOUS)
APPLIER CLIP ROT 10 11.4 M/L (STAPLE)
APR CLP MED LRG 11.4X10 (STAPLE)
APR CLP MED LRG 5 ANG JAW (MISCELLANEOUS)
BAG COUNTER SPONGE SURGICOUNT (BAG) ×2 IMPLANT
BAG SPNG CNTER NS LX DISP (BAG) ×1
BLADE SURG SZ11 CARB STEEL (BLADE) ×2 IMPLANT
CHLORAPREP W/TINT 26 (MISCELLANEOUS) ×2 IMPLANT
CLIP APPLIE 5 13 M/L LIGAMAX5 (MISCELLANEOUS) IMPLANT
CLIP APPLIE ROT 10 11.4 M/L (STAPLE) IMPLANT
COVER SURGICAL LIGHT HANDLE (MISCELLANEOUS) ×2 IMPLANT
COVER TIP SHEARS 8 DVNC (MISCELLANEOUS) IMPLANT
COVER TIP SHEARS 8MM DA VINCI (MISCELLANEOUS)
DERMABOND ADVANCED (GAUZE/BANDAGES/DRESSINGS) ×1
DERMABOND ADVANCED .7 DNX12 (GAUZE/BANDAGES/DRESSINGS) ×1 IMPLANT
DRAIN PENROSE 0.5X18 (DRAIN) IMPLANT
DRAPE ARM DVNC X/XI (DISPOSABLE) ×4 IMPLANT
DRAPE COLUMN DVNC XI (DISPOSABLE) ×1 IMPLANT
DRAPE CV SPLIT W-CLR ANES SCRN (DRAPES) ×2 IMPLANT
DRAPE DA VINCI XI ARM (DISPOSABLE) ×8
DRAPE DA VINCI XI COLUMN (DISPOSABLE) ×2
DRAPE ORTHO SPLIT 77X108 STRL (DRAPES) ×2
DRAPE SURG ORHT 6 SPLT 77X108 (DRAPES) ×1 IMPLANT
ELECT REM PT RETURN 15FT ADLT (MISCELLANEOUS) ×2 IMPLANT
GAUZE 4X4 16PLY ~~LOC~~+RFID DBL (SPONGE) ×2 IMPLANT
GLOVE BIOGEL PI IND STRL 7.0 (GLOVE) ×2 IMPLANT
GLOVE BIOGEL PI INDICATOR 7.0 (GLOVE) ×2
GLOVE SURG SS PI 7.0 STRL IVOR (GLOVE) ×4 IMPLANT
GOWN STRL REUS W/ TWL LRG LVL3 (GOWN DISPOSABLE) ×2 IMPLANT
GOWN STRL REUS W/ TWL XL LVL3 (GOWN DISPOSABLE) IMPLANT
GOWN STRL REUS W/TWL LRG LVL3 (GOWN DISPOSABLE) ×4
GOWN STRL REUS W/TWL XL LVL3 (GOWN DISPOSABLE)
IRRIG SUCT STRYKERFLOW 2 WTIP (MISCELLANEOUS) ×2
IRRIGATION SUCT STRKRFLW 2 WTP (MISCELLANEOUS) ×1 IMPLANT
KIT BASIN OR (CUSTOM PROCEDURE TRAY) ×2 IMPLANT
KIT TURNOVER KIT A (KITS) IMPLANT
LUBRICANT JELLY K Y 4OZ (MISCELLANEOUS) IMPLANT
MARKER SKIN DUAL TIP RULER LAB (MISCELLANEOUS) ×2 IMPLANT
NEEDLE HYPO 22GX1.5 SAFETY (NEEDLE) ×2 IMPLANT
SCISSORS LAP 5X35 DISP (ENDOMECHANICALS) IMPLANT
SEAL CANN UNIV 5-8 DVNC XI (MISCELLANEOUS) ×4 IMPLANT
SEAL XI 5MM-8MM UNIVERSAL (MISCELLANEOUS) ×8
SEALER VESSEL DA VINCI XI (MISCELLANEOUS) ×2
SEALER VESSEL EXT DVNC XI (MISCELLANEOUS) ×1 IMPLANT
SOL ANTI FOG 6CC (MISCELLANEOUS) ×1 IMPLANT
SOLUTION ANTI FOG 6CC (MISCELLANEOUS) ×1
SOLUTION ELECTROLUBE (MISCELLANEOUS) ×1 IMPLANT
SPIKE FLUID TRANSFER (MISCELLANEOUS) ×2 IMPLANT
SUT ETHIBOND 0 36 GRN (SUTURE) ×4 IMPLANT
SUT MNCRL AB 4-0 PS2 18 (SUTURE) ×2 IMPLANT
SUT SILK 0 SH 30 (SUTURE) IMPLANT
SUT SILK 2 0 SH (SUTURE) ×1 IMPLANT
SYR 20ML LL LF (SYRINGE) ×4 IMPLANT
TIP INNERVISION DETACH 40FR (MISCELLANEOUS) IMPLANT
TIP INNERVISION DETACH 50FR (MISCELLANEOUS) IMPLANT
TIP INNERVISION DETACH 56FR (MISCELLANEOUS) IMPLANT
TIPS INNERVISION DETACH 40FR (MISCELLANEOUS)
TOWEL OR 17X26 10 PK STRL BLUE (TOWEL DISPOSABLE) ×2 IMPLANT
TRAY FOLEY MTR SLVR 16FR STAT (SET/KITS/TRAYS/PACK) IMPLANT
TROCAR Z-THREAD OPTICAL 5X100M (TROCAR) ×2 IMPLANT
TUBING ENDO SMARTCAP (MISCELLANEOUS) ×1 IMPLANT
TUBING INSUFFLATION 10FT LAP (TUBING) ×2 IMPLANT

## 2021-08-20 NOTE — Plan of Care (Signed)
  Problem: Activity: Goal: Risk for activity intolerance will decrease Outcome: Progressing   

## 2021-08-20 NOTE — Op Note (Signed)
Preoperative diagnosis: type III paraesophageal hernia  Postoperative diagnosis: same   Procedure: robotic paraesophageal hernia repair, robotic Toupet fundoplication, upper endoscopy  Surgeon: Gurney Maxin, M.D.  Asst: Louanna Raw, M.D.  Anesthesia: general  Indications for procedure: [name] is a 66 y.o. year old female with symptoms of nausea, eructations, and reflux. Work up showed a moderate sized hiatal hernia containing the majority of the stomach. She presents for surgery.  Description of procedure: The patient was brought into the operative suite. Anesthesia was administered with General endotracheal anesthesia. WHO checklist was applied. The patient was then placed in supine position. The area was prepped and draped in the usual sterile fashion.  Next, a left mid abdominal incision was made. A 33m trocar was used to gain access to the peritoneal cavity by optical entry technique. Pneumoperitoneum was applied with a high flow and low pressure. The laparoscope was reinserted to confirm position. An 8 mm trocar was placed in the left mid abdomen and an 8 mm trocar was placed in the left lateral abdomen. An 8 mm trocar was placed in the right mid abdomen. A 5 mm trocar was placed in the right upper abdomen. Bilateral TAP blocks were placed with Marcaine/Exparel mix.  A Nathanson retractor was placed in the subxiphoid space and used to retract the left lobe of the liver.  The hiatal hernia appeared large and contained the upper half of the stomach. The pars flaccida was divided with harmonic scalpel The peritoneum of the right crus was divided and the sac separated from the chest contents. This plane was continued anteriorly and to the left crus. Next, the posterior area was dissected free. Additional care was used to dissect attachments to the chest to the sac and esophagus to improve mobility. During dissection there was a partial thickness tear made in the anterior stomach  which was repaired with 3-0 silk lembert sutures. A penrose was placed around the GE junction for visualization and retraction. The esophagus was completely freed from surrounding attachments. Care was taken to avoid injury to the vagus nerves. The sac was divided from the GE junction and removed.  The crus was repaired with 4 interrupted 2-0 ethibond sutures showing appropriate sizing of the crus. A toupet was created by bringing the fundus posterior to the esophagus and 3 2-0 silks were used to suture the esophagus to the fundus on the right side. A portion of anterior fundus was then sutured to the esophagus on the left side with 3 2-0 silks.  Endoscopy was performed. Leak test was negative. The GE junction was appropriately positioned in relation to the wrap.  Hemostasis was inspected and intact. Pneumoperitoneum was removed. All trocars were removed. All incisions were closed with 4-0 monocryl subcuticular suture. Dermabond was placed for dressing. The patient awoke from anesthesia and was brought to pacu in stable condition.   Findings: type III hiatal hernia  Specimen: none  Implant: none   Blood loss: 20 ml  Local anesthesia: 50 ml Exparel:Marcaine Mix  Complications: none  LGurney Maxin M.D. General, Bariatric, & Minimally Invasive Surgery [facility]

## 2021-08-20 NOTE — Anesthesia Postprocedure Evaluation (Signed)
Anesthesia Post Note  Patient: YENI JIGGETTS  Procedure(s) Performed: XI ROBOTIC ASSISTED HIATAL HERNIA REPAIR WITH PARTIAL FUNDOPLICATION AND EGD     Patient location during evaluation: PACU Anesthesia Type: General Level of consciousness: awake and alert, oriented and patient cooperative Pain management: pain level controlled Vital Signs Assessment: post-procedure vital signs reviewed and stable Respiratory status: spontaneous breathing, nonlabored ventilation and respiratory function stable Cardiovascular status: blood pressure returned to baseline and stable Postop Assessment: no apparent nausea or vomiting Anesthetic complications: yes   Encounter Notable Events  Notable Event Outcome Phase Comment  Difficult to intubate - expected  <72 hours postprocedure Filed from anesthesia note documentation.    Last Vitals:  Vitals:   08/20/21 1615 08/20/21 1630  BP: (!) 133/55 (!) 131/54  Pulse: 70 77  Resp: 12 12  Temp:    SpO2: 94% 93%    Last Pain:  Vitals:   08/20/21 1630  TempSrc:   PainSc: [place]

## 2021-08-20 NOTE — Anesthesia Procedure Notes (Signed)
Procedure Name: Intubation Date/Time: 08/20/2021 12:46 PM  Performed by: Cynda Familia, CRNAPre-anesthesia Checklist: Patient identified, Emergency Drugs available, Suction available and Patient being monitored Patient Re-evaluated:Patient Re-evaluated prior to induction Oxygen Delivery Method: Circle System Utilized Preoxygenation: Pre-oxygenation with 100% oxygen Induction Type: IV induction, Rapid sequence and Cricoid Pressure applied Laryngoscope Size: Miller and 2 Grade View: Grade III Tube type: Oral Number of attempts: 1 Airway Equipment and Method: Stylet Placement Confirmation: ETT inserted through vocal cords under direct vision, positive ETCO2 and breath sounds checked- equal and bilateral Secured at: 21 cm Tube secured with: Tape Dental Injury: Teeth and Oropharynx as per pre-operative assessment  Difficulty Due To: Difficulty was anticipated, Difficult Airway- due to dentition and Difficult Airway- due to anterior larynx Future Recommendations: Recommend- induction with short-acting agent, and alternative techniques readily available Comments: CONSIDER DIFFICULT INTUBATION-- pt anterior--- IV induction Finucane MDA-- intubation AM CRNA atraumatic-- teeth and mouth as preop -- bilat BS

## 2021-08-20 NOTE — Transfer of Care (Signed)
Immediate Anesthesia Transfer of Care Note  Patient: [name]  Procedure(s) Performed: XI ROBOTIC ASSISTED HIATAL HERNIA REPAIR WITH PARTIAL FUNDOPLICATION  Patient Location: PACU  Anesthesia Type:General  Level of Consciousness: awake and alert   Airway & Oxygen Therapy: Patient Spontanous Breathing and Patient connected to face mask oxygen  Post-op Assessment: Report given to RN and Post -op Vital signs reviewed and stable  Post vital signs: Reviewed and stable  Last Vitals:  Vitals Value Taken Time  BP 139/66 08/20/21 1518  Temp    Pulse 80 08/20/21 1520  Resp 15 08/20/21 1520  SpO2 100 % 08/20/21 1520  Vitals shown include unvalidated device data.  Last Pain:  Vitals:   08/20/21 1032  TempSrc: Oral         Complications: No notable events documented.

## 2021-08-20 NOTE — H&P (Signed)
   Reason for Consult:hiatal hernia  [name] is an 66 y.o. female.  HPI: 66  yo female with hiatal hernia. She presents today for repair.  Past Medical History:  Diagnosis Date   Anemia    Anxiety    Arthritis    Asthma    Cancer (Century)    Depression    Dyspnea    GERD (gastroesophageal reflux disease)    Goiter    History of hiatal hernia    Hypertension     Past Surgical History:  Procedure Laterality Date   CHOLECYSTECTOMY     FOOT SURGERY     KNEE SURGERY Left    TKA x2    1974, 2014   SHOULDER SURGERY     THYROIDECTOMY N/A 07/05/2021   Procedure: TOTAL THYROIDECTOMY;  Surgeon: Kuulei Kleier, [name], MD;  Location: [facility];  Service: General;  Laterality: N/A;   TOTAL HIP ARTHROPLASTY Left    TOTAL KNEE ARTHROPLASTY Right 11/28/2020   Procedure: TOTAL KNEE ARTHROPLASTY;  Surgeon: Paralee Cancel, MD;  Location: [facility];  Service: Orthopedics;  Laterality: Right;    Family History  Problem Relation Age of Onset   Heart disease Mother    Breast cancer Maternal Grandmother    Emphysema Paternal Grandmother        never smoked but chewed tobacco    Social History:  reports that she quit smoking about 49 years ago. Her smoking use included cigarettes. She has a 4.00 pack-year smoking history. She has never used smokeless tobacco. She reports current alcohol use. She reports that she does not use drugs.  Allergies:  Allergies  Allergen Reactions   Mango Flavor [Flavoring Agent] Anaphylaxis    Specifically mango peel , patient can eat the fruit itself   Oxycontin [Oxycodone]     Dizziness    Medications: I have reviewed the patient's current medications.  No results found for this or any previous visit (from the past 48 hour(s)).  No results found.  Review of Systems  Constitutional: Negative.   HENT: Negative.    Eyes: Negative.   Respiratory: Negative.    Cardiovascular: Negative.   Gastrointestinal: Negative.   Genitourinary: Negative.    Musculoskeletal: Negative.   Skin: Negative.   Neurological: Negative.   Endo/Heme/Allergies: Negative.   Psychiatric/Behavioral: Negative.      PE Blood pressure (!) 144/69, pulse 60, temperature 98.5 F (36.9 C), temperature source Oral, resp. rate 16, last menstrual period 08/08/2010, SpO2 97 %. Constitutional: NAD; conversant; no deformities Eyes: Moist conjunctiva; no lid lag; anicteric; PERRL Neck: Trachea midline; no thyromegaly Lungs: Normal respiratory effort; no tactile fremitus CV: RRR; no palpable thrills; no pitting edema GI: Abd soft, NT; no palpable hepatosplenomegaly MSK: Normal gait; no clubbing/cyanosis Psychiatric: Appropriate affect; alert and oriented x3 Lymphatic: No palpable cervical or axillary lymphadenopathy Skin: No major subcutaneous nodules. Warm and dry   Assessment/Plan: 67 yo female with type III hiatal hernia -robotic hiatal hernia repair with partial fundoplication -obs stay  I reviewed last 24 h vitals and pain scores, last 48 h intake and output, last 24 h labs and trends, and last 24 h imaging results.  This care required high  level of medical decision making.   [name] Giorgi Debruin 08/20/2021, 11:55 AM

## 2021-08-20 NOTE — Anesthesia Procedure Notes (Signed)
Date/Time: 08/20/2021 3:08 PM  Performed by: Cynda Familia, CRNAOxygen Delivery Method: Simple face mask Placement Confirmation: positive ETCO2 and breath sounds checked- equal and bilateral Dental Injury: Teeth and Oropharynx as per pre-operative assessment

## 2021-08-20 NOTE — Anesthesia Preprocedure Evaluation (Addendum)
Anesthesia Evaluation  Patient identified by MRN, date of birth, ID band Patient awake    Reviewed: Allergy & Precautions, NPO status , Patient's Chart, lab work & pertinent test results  Airway Mallampati: III  TM Distance: >3 FB Neck ROM: Full    Dental  (+) Teeth Intact, Dental Advisory Given   Pulmonary asthma (uses albuterol once per month) , former smoker,  Quit smoking 1974, former light smoker  Snores at night, has never had sleep study   Pulmonary exam normal breath sounds clear to auscultation       Cardiovascular hypertension (144/69 in preop, normally 120s SBP), Pt. on medications Normal cardiovascular exam Rhythm:Regular Rate:Normal     Neuro/Psych PSYCHIATRIC DISORDERS Anxiety Depression negative neurological ROS     GI/Hepatic hiatal hernia, GERD  Controlled and Medicated,(+)       alcohol use, Hiatal hernia    Endo/Other  BMI 35  Renal/GU negative Renal ROS  negative genitourinary   Musculoskeletal  (+) Arthritis , Osteoarthritis,    Abdominal (+) + obese,   Peds  Hematology negative hematology ROS (+) hct 41.2, plt 218   Anesthesia Other Findings   Reproductive/Obstetrics negative OB ROS                           Anesthesia Physical Anesthesia Plan  ASA: 2  Anesthesia Plan: General   Post-op Pain Management: Tylenol PO (pre-op)*   Induction: Intravenous and Rapid sequence  PONV Risk Score and Plan: 4 or greater and Ondansetron, Dexamethasone, Midazolam and Treatment may vary due to age or medical condition  Airway Management Planned: Oral ETT  Additional Equipment: None  Intra-op Plan:   Post-operative Plan: Extubation in OR  Informed Consent: I have reviewed the patients History and Physical, chart, labs and discussed the procedure including the risks, benefits and alternatives for the proposed anesthesia with the patient or authorized representative who  has indicated his/her understanding and acceptance.     Dental advisory given  Plan Discussed with: CRNA  Anesthesia Plan Comments:        Anesthesia Quick Evaluation

## 2021-08-21 ENCOUNTER — Encounter (HOSPITAL_COMMUNITY): Payer: Self-pay | Admitting: General Surgery

## 2021-08-21 DIAGNOSIS — K449 Diaphragmatic hernia without obstruction or gangrene: Secondary | ICD-10-CM | POA: Diagnosis not present

## 2021-08-21 DIAGNOSIS — I10 Essential (primary) hypertension: Secondary | ICD-10-CM | POA: Diagnosis not present

## 2021-08-21 DIAGNOSIS — Z87891 Personal history of nicotine dependence: Secondary | ICD-10-CM | POA: Diagnosis not present

## 2021-08-21 DIAGNOSIS — J45909 Unspecified asthma, uncomplicated: Secondary | ICD-10-CM | POA: Diagnosis not present

## 2021-08-21 DIAGNOSIS — Z96642 Presence of left artificial hip joint: Secondary | ICD-10-CM | POA: Diagnosis not present

## 2021-08-21 DIAGNOSIS — Z859 Personal history of malignant neoplasm, unspecified: Secondary | ICD-10-CM | POA: Diagnosis not present

## 2021-08-21 DIAGNOSIS — Z96651 Presence of right artificial knee joint: Secondary | ICD-10-CM | POA: Diagnosis not present

## 2021-08-21 DIAGNOSIS — K219 Gastro-esophageal reflux disease without esophagitis: Secondary | ICD-10-CM | POA: Diagnosis not present

## 2021-08-21 MED ORDER — HYDROCODONE-ACETAMINOPHEN 5-325 MG PO TABS: 1.0000 | ORAL_TABLET | Freq: Four times a day (QID) | ORAL | 0 refills | Status: DC | PRN

## 2021-08-21 MED ORDER — PANTOPRAZOLE SODIUM 40 MG PO TBEC: 40.0000 mg | DELAYED_RELEASE_TABLET | Freq: Every day | ORAL | 0 refills | Status: DC

## 2021-08-21 MED ORDER — ALUM & MAG HYDROXIDE-SIMETH 200-200-20 MG/5ML PO SUSP
30.0000 mL | Freq: Four times a day (QID) | ORAL | Status: DC | PRN
  Administered 2021-08-21: 30 mL via ORAL
  Filled 2021-08-21: qty 30

## 2021-08-21 NOTE — Discharge Summary (Signed)
Physician Discharge Summary  [name] CWC:[id number] DOB: 21-Nov-1955 DOA: 08/20/2021  PCP: Orma Render, NP  Admit date: 08/20/2021 Discharge date: 08/21/2021  Recommendations for Outpatient Follow-up:   (include homehealth, outpatient follow-up instructions, specific recommendations for PCP to follow-up on, etc.)   Follow-up Information     Muad Noga, [name], MD Follow up on 09/21/2021.   Specialty: General Surgery Why: at 10:50 Contact information: [address] [address] [phone]                Discharge Diagnoses:  Principal Problem:   Hiatal hernia   Surgical Procedure: robotic hiatal hernia repair with partial fundoplication  Discharge Condition: Good Disposition: Home  Diet recommendation: full liquids for 2 weeks then bland   Hospital Course:  66 yo female presented for hiatal hernia repair. Post op she did well. She tolerated liquids without dysphagia. She had some chest soreness but minimal abdominal pain. She was discharged home POD 1.  Discharge Instructions  Discharge Instructions     Call MD for:  difficulty breathing, headache or visual disturbances   Complete by: As directed    Call MD for:  hives   Complete by: As directed    Call MD for:  persistant nausea and vomiting   Complete by: As directed    Call MD for:  redness, tenderness, or signs of infection (pain, swelling, redness, odor or green/yellow discharge around incision site)   Complete by: As directed    Call MD for:  severe uncontrolled pain   Complete by: As directed    Call MD for:  temperature >100.4   Complete by: As directed    Diet - low sodium heart healthy   Complete by: As directed    Discharge wound care:   Complete by: As directed    Ok to shower tomorrow. Glue will likely peel off in 1-3 weeks. No bandage required   Driving Restrictions   Complete by: As directed    No driving while on narcotics   Increase activity slowly    Complete by: As directed    Lifting restrictions   Complete by: As directed    No lifting greater than 20 pounds for 3 weeks      Allergies as of 08/21/2021       Reactions   Mango Flavor [flavoring Agent] Anaphylaxis   Specifically mango peel , patient can eat the fruit itself   Oxycontin [oxycodone]    Dizziness        Medication List     STOP taking these medications    calcium carbonate 1250 (500 Ca) MG tablet Commonly known as: OS-CAL - dosed in mg of elemental calcium   ferrous sulfate 325 (65 FE) MG EC tablet   omeprazole 20 MG capsule Commonly known as: PRILOSEC       TAKE these medications    albuterol 108 (90 Base) MCG/ACT inhaler Commonly known as: VENTOLIN HFA Inhale 2 puffs into the lungs every 6 (six) hours as needed for wheezing or shortness of breath.   Breztri Aerosphere 160-9-4.8 MCG/ACT Aero Generic drug: Budeson-Glycopyrrol-Formoterol Inhale 2 puffs into the lungs in the morning and at bedtime.   cetirizine 10 MG tablet Commonly known as: ZYRTEC Take 1 tablet (10 mg total) by mouth 2 (two) times daily. What changed:  when to take this reasons to take this   cholecalciferol 25 MCG (1000 UNIT) tablet Commonly known as: VITAMIN D3 Take 1,000 Units by mouth  daily.   fluticasone 50 MCG/ACT nasal spray Commonly known as: FLONASE Place 1 spray into both nostrils daily.   hydrochlorothiazide 25 MG tablet Commonly known as: HYDRODIURIL TAKE ONE TABLET BY MOUTH EVERY MORNING   HYDROcodone-acetaminophen 5-325 MG tablet Commonly known as: NORCO/VICODIN Take 1 tablet by mouth every 6 (six) hours as needed for moderate pain.   levothyroxine 112 MCG tablet Commonly known as: SYNTHROID Take 112 mcg by mouth daily. What changed: Another medication with the same name was removed. Continue taking this medication, and follow the directions you see here.   magnesium oxide 400 MG tablet Commonly known as: MAG-OX Take 400 mg by mouth daily.    pantoprazole 40 MG tablet Commonly known as: PROTONIX Take 1 tablet (40 mg total) by mouth daily for 30 doses.   sertraline 100 MG tablet Commonly known as: ZOLOFT TAKE 1.5 TABLETS BY MOUTH EVERY MORNING   SOLARCAINE EX Apply 1 application. topically daily as needed (bug bites).   vitamin C 1000 MG tablet Take 1,000 mg by mouth daily.               Discharge Care Instructions  (From admission, onward)           Start     Ordered   08/21/21 0000  Discharge wound care:       Comments: Ok to shower tomorrow. Glue will likely peel off in 1-3 weeks. No bandage required   08/21/21 1038            Follow-up Information     Ngozi Alvidrez, [name], MD Follow up on 09/21/2021.   Specialty: General Surgery Why: at 10:50 Contact information: [address] [place] [address] [phone]                  The results of significant diagnostics from this hospitalization (including imaging, microbiology, ancillary and laboratory) are listed below for reference.    Significant Diagnostic Studies: DG UGI W DOUBLE CM (HD BA)  Result Date: 08/02/2021 CLINICAL DATA:  Food sticking in chest.  Hiatal hernia. EXAM: UPPER GI SERIES WITH KUB TECHNIQUE: After obtaining a scout radiograph a routine upper GI series was performed using thin and high density barium. FLUOROSCOPY: Radiation Exposure Index (as provided by the fluoroscopic device): 58.1 mGy Kerma COMPARISON:  None Available. FINDINGS: Scout radiograph:  Unremarkable bowel gas pattern. Esophagus: A severe smooth stricture is seen involving the distal thoracic esophagus, just above the GE junction. No mucosal ulcerations are seen. A 13 mm barium tablet became stuck transiently at the site of the distal esophageal stricture, but did pass with subsequent swallows of water. Mild gastroesophageal reflux is seen to the level of the distal thoracic esophagus. Mild esophageal dysmotility is seen with intermittent  tertiary contractions in the distal thoracic esophagus. Stomach: A moderate size sliding hiatal hernia is seen. The remainder of the stomach is normal in appearance. No evidence of gastric masses or ulcers. Duodenum: Normal appearance of duodenal bulb and sweep. Normal position of ligament of Treitz. No ulcer, stricture, or other significant abnormality seen. Other:  None. IMPRESSION: Moderate sliding hiatal hernia. Mild gastroesophageal reflux. 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 A 13 mm barium tablet became stuck transiently at this site, but did pass with subsequent swallows of water. Mild esophageal dysmotility. Electronically Signed   By: Marlaine Hind M.D.   On: 08/02/2021 08:44    Labs: Basic Metabolic Panel: Recent Labs  Lab 08/20/21 1705  CREATININE 0.93   Liver Function Tests: No results for input(s): "AST", "ALT", "ALKPHOS", "BILITOT", "PROT", "ALBUMIN" in the last 168 hours.  CBC: Recent Labs  Lab 08/20/21 1705  WBC 12.1*  HGB 13.3  HCT 41.3  MCV 92.4  PLT 190    CBG: No results for input(s): "GLUCAP" in the last 168 hours.  Principal Problem:   Hiatal hernia   Time coordinating discharge: 15 min

## 2021-08-21 NOTE — Progress Notes (Signed)
Patient was given discharge instructions, and all questions were answered. Patient was stable for discharge and was walked to the main exit. 

## 2021-08-21 NOTE — Discharge Instructions (Signed)
EATING AFTER YOUR ESOPHAGEAL SURGERY (Stomach Fundoplication, Hiatal Hernia repair, Achalasia surgery, etc)  ######################################################################  EAT Start with a pureed / full liquid diet (see below) Gradually transition to a high fiber diet with a fiber supplement over the next month after discharge.    WALK Walk an hour a day.  Control your pain to do that.    CONTROL PAIN Control pain so that you can walk, sleep, tolerate sneezing/coughing, go up/down stairs.  HAVE A BOWEL MOVEMENT DAILY Keep your bowels regular to avoid problems.  OK to try a laxative to override constipation.  OK to use an antidairrheal to slow down diarrhea.  Call if not better after 2 tries  CALL IF YOU HAVE PROBLEMS/CONCERNS Call if you are still struggling despite following these instructions. Call if you have concerns not answered by these instructions  ######################################################################   After your esophageal surgery, expect some sticking with swallowing over the next 1-2 months.    If food sticks when you eat, it is called "dysphagia".  This is due to swelling around your esophagus at the wrap & hiatal diaphragm repair.  It will gradually ease off over the next few months.  To help you through this temporary phase, we start you out on a pureed (blenderized) diet.  Your first meal in the hospital was thin liquids.  You should have been given a pureed diet by the time you left the hospital.  We ask patients to stay on a pureed diet for the first 2-3 weeks to avoid anything getting "stuck" near your recent surgery.  Don't be alarmed if your ability to swallow doesn't progress according to this plan.  Everyone is different and some diets can advance more or less quickly.    It is often helpful to crush your medications or split them as they can sometimes stick, especially the first week or so.   Some BASIC RULES to follow  are:  Maintain an upright position whenever eating or drinking.  Take small bites - just a teaspoon size bite at a time.  Eat slowly.  It may also help to eat only one food at a time.  Consider nibbling through smaller, more frequent meals & avoid the urge to eat BIG meals  Do not push through feelings of fullness, nausea, or bloatedness  Do not mix solid foods and liquids in the same mouthful  Try not to "wash foods down" with large gulps of liquids.  Avoid carbonated (bubbly/fizzy) drinks.    Avoid foods that make you feel gassy or bloated.  Start with bland foods first.  Wait on trying greasy, fried, or spicy meals until you are tolerating more bland solids well.  Understand that it will be hard to burp and belch at first.  This gradually improves with time.  Expect to be more gassy/flatulent/bloated initially.  Walking will help your body manage it better.  Consider using medications for bloating that contain simethicone such as  Maalox or Gas-X   Consider crushing her medications, especially smaller pills.  The ability to swallow pills should get easier after a few weeks  Eat in a relaxed atmosphere & minimize distractions.  Avoid talking while eating.    Do not use straws.  Following each meal, sit in an upright position (90 degree angle) for 60 to 90 minutes.  Going for a short walk can help as well  If food does stick, don't panic.  Try to relax and let the food pass on its own.    Be gradual in changes & use common sense:  -If you easily tolerating a certain "level" of foods, advance to the next level gradually -If you are having trouble swallowing a particular food, then avoid it.   -If food is sticking when you advance your diet, go back to thinner previous diet (the lower LEVEL) for 1-2 days.  LEVEL 1 = PUREED DIET  Do for the first 2 WEEKS AFTER SURGERY  -Foods in this group are pureed or blenderized to a smooth, mashed  potato-like consistency.  -If necessary, the pureed foods can keep their shape with the addition of a thickening agent.   -Meat should be pureed to a smooth, pasty consistency.  Hot broth or gravy may be added to the pureed meat, approximately 1 oz. of liquid per 3 oz. serving of meat. -CAUTION:  If any foods do not puree into a smooth consistency, swallowing will be more difficult.  (For example, nuts or seeds sometimes do not blend well.)  Hot Foods Cold Foods  Pureed scrambled eggs and cheese Pureed cottage cheese  Baby cereals Thickened juices and nectars  Thinned cooked cereals (no lumps) Thickened milk or eggnog  Pureed [place] toast or pancakes Ensure  Mashed potatoes Ice cream  Pureed parsley, au gratin, scalloped potatoes, candied sweet potatoes Fruit or [place] ice, sherbet  Pureed buttered or alfredo noodles Plain yogurt  Pureed vegetables (no corn or peas) Instant breakfast  Pureed soups and creamed soups Smooth pudding, mousse, custard  Pureed scalloped apples Whipped gelatin  Gravies Sugar, syrup, honey, jelly  Sauces, cheese, tomato, barbecue, white, creamed Cream  Any baby food Creamer  Alcohol in moderation (not beer or champagne) Margarine  Coffee or tea Mayonnaise   Ketchup, mustard   Apple sauce   SAMPLE MENU:  PUREED DIET Breakfast Lunch Dinner  Orange juice, 1/2 cup Cream of wheat, 1/2 cup Pineapple juice, 1/2 cup Pureed [place], barley soup, 3/4 cup Pureed Hawaiian chicken, 3 oz  Scrambled eggs, mashed or blended with cheese, 1/2 cup Tea or coffee, 1 cup  Whole milk, 1 cup  Non-dairy creamer, 2 Tbsp. Mashed potatoes, 1/2 cup Pureed cooled broccoli, 1/2 cup Apple sauce, 1/2 cup Coffee or tea Mashed potatoes, 1/2 cup Pureed spinach, 1/2 cup Frozen yogurt, 1/2 cup Tea or coffee      LEVEL 2 = SOFT DIET  After your first 2 weeks, you can advance to a soft diet.   Keep on this diet until everything goes down easily.  Hot Foods Cold Foods  White fish  Cottage cheese  Stuffed fish Junior baby fruit  Baby food meals Semi thickened juices  Minced soft cooked, scrambled, poached eggs nectars  Souffle & omelets Ripe mashed bananas  Cooked cereals Canned fruit, pineapple sauce, milk  potatoes Milkshake  Buttered or Alfredo noodles Custard  Cooked cooled vegetable Puddings, including tapioca  Sherbet Yogurt  Vegetable soup or alphabet soup Fruit ice, [place] ice  Gravies Whipped gelatin  Sugar, syrup, honey, jelly Junior baby desserts  Sauces:  Cheese, creamed, barbecue, tomato, white Cream  Coffee or tea Margarine   SAMPLE MENU:  LEVEL 2 Breakfast Lunch Dinner  Orange juice, 1/2 cup Oatmeal, 1/2 cup Scrambled eggs with cheese, 1/2 cup Decaffeinated tea, 1 cup Whole milk, 1 cup Non-dairy creamer, 2 Tbsp Pineapple juice, 1/2 cup Minced beef, 3 oz Gravy, 2 Tbsp Mashed potatoes, 1/2 cup Minced fresh broccoli, 1/2 cup Applesauce, 1/2 cup Coffee, 1 cup [place], barley soup, 3/4 cup Minced Hawaiian chicken, 3 oz  Mashed potatoes, 1/2 cup Cooked spinach, 1/2 cup Frozen yogurt, 1/2 cup Non-dairy creamer, 2 Tbsp      LEVEL 3 = CHOPPED DIET  -After all the foods in level 2 (soft diet) are passing through well you should advance up to more chopped foods.  -It is still important to cut these foods into small pieces and eat slowly.  Hot Foods Cold Foods  Poultry Cottage cheese  Chopped Swedish meatballs Yogurt  Meat salads (ground or flaked meat) Milk  Flaked fish (tuna) Milkshakes  Poached or scrambled eggs Soft, cold, dry cereal  Souffles and omelets Fruit juices or nectars  Cooked cereals Chopped canned fruit  Chopped [place] toast or pancakes Canned fruit cocktail  Noodles or pasta (no rice) Pudding, mousse, custard  Cooked vegetables (no frozen peas, corn, or mixed vegetables) Green salad  Canned small sweet peas Ice cream  Creamed soup or vegetable soup Fruit ice, [place] ice  Pureed vegetable soup or alphabet soup  Non-dairy creamer  Ground scalloped apples Margarine  Gravies Mayonnaise  Sauces:  Cheese, creamed, barbecue, tomato, white Ketchup  Coffee or tea Mustard   SAMPLE MENU:  LEVEL 3 Breakfast Lunch Dinner  Orange juice, 1/2 cup Oatmeal, 1/2 cup Scrambled eggs with cheese, 1/2 cup Decaffeinated tea, 1 cup Whole milk, 1 cup Non-dairy creamer, 2 Tbsp Ketchup, 1 Tbsp Margarine, 1 tsp Salt, 1/4 tsp Sugar, 2 tsp Pineapple juice, 1/2 cup Ground beef, 3 oz Gravy, 2 Tbsp Mashed potatoes, 1/2 cup Cooked spinach, 1/2 cup Applesauce, 1/2 cup Decaffeinated coffee Whole milk Non-dairy creamer, 2 Tbsp Margarine, 1 tsp Salt, 1/4 tsp Pureed [place], barley soup, 3/4 cup Barbecue chicken, 3 oz Mashed potatoes, 1/2 cup Ground fresh broccoli, 1/2 cup Frozen yogurt, 1/2 cup Decaffeinated tea, 1 cup Non-dairy creamer, 2 Tbsp Margarine, 1 tsp Salt, 1/4 tsp Sugar, 1 tsp    LEVEL 4:  REGULAR FOODS  -Foods in this group are soft, moist, regularly textured foods.   -This level includes meat and breads, which tend to be the hardest things to swallow.   -Eat very slowly, chew well and continue to avoid carbonated drinks. -most people are at this level in 4-6 weeks  Hot Foods Cold Foods  Baked fish or skinned Soft cheeses - cottage cheese  Souffles and omelets Cream cheese  Eggs Yogurt  Stuffed shells Milk  Spaghetti with meat sauce Milkshakes  Cooked cereal Cold dry cereals (no nuts, dried fruit, coconut)  [place] toast or pancakes Crackers  Buttered toast Fruit juices or nectars  Noodles or pasta (no rice) Canned fruit  Potatoes (all types) Ripe bananas  Soft, cooked vegetables (no corn, lima, or baked beans) Peeled, ripe, fresh fruit  Creamed soups or vegetable soup Cakes (no nuts, dried fruit, coconut)  Canned chicken noodle soup Plain doughnuts  Gravies Ice cream  Bacon dressing Pudding, mousse, custard  Sauces:  Cheese, creamed, barbecue, tomato, white Fruit ice, [place] ice, sherbet   Decaffeinated tea or coffee Whipped gelatin  Pork chops Regular gelatin   Canned fruited gelatin molds   Sugar, syrup, honey, jam, jelly   Cream   Non-dairy   Margarine   Oil   Mayonnaise   Ketchup   Mustard   TROUBLESHOOTING IRREGULAR BOWELS  1) Avoid extremes of bowel movements (no bad constipation/diarrhea)  2) Miralax 17gm mixed in 8oz. water or juice-daily. May use BID as needed.  3) Gas-x,Phazyme, etc. as needed for gas & bloating.  4) Full liquid diet for 2 weeks. Then soft,bland diet.  No spicy,greasy,fried foods.  6) May hold gluten/wheat products from diet to see if symptoms improve.  7) May try probiotics (Align, Activa, etc) to help calm the bowels down  7) If symptoms become worse call back immediately.    If you have any questions please call our office at [place]: [phone].

## 2021-08-21 NOTE — Progress Notes (Signed)
24 hour chart audit completed 

## 2021-08-21 NOTE — Progress Notes (Signed)
Transition of Care [facility]) Screening Note  Patient Details  Name: [name] Date of Birth: 05-07-1955  Transition of Care [facility]) CM/SW Contact:    Sherie Don, LCSW Phone Number: 08/21/2021, 10:55 AM  Transition of Care Department [facility]) has reviewed patient and no TOC needs have been identified at this time. We will continue to monitor patient advancement through interdisciplinary progression rounds. If new patient transition needs arise, please place a TOC consult.

## 2021-09-10 NOTE — Progress Notes (Unsigned)
Synopsis: Referred for dyspnea by Early, Coralee Pesa, NP    Subjective:   PATIENT ID: [name] GENDER: female DOB: [birth year], MRN: [id number]  No chief complaint on file.  66yF with history of goiter, asthma on advair, HTN, large hiatal hernia referred for shortness of breath.   Never did get covid-19. She is vaccinated and boosted for covid-19.   She loves to garden, swim but is now very limited by her dyspnea. Has trouble even walking her dogs down the street. This has been essentially since June that she's noticed a decline. She has no orthopnea. She is unsure if she has worsening dyspnea walking into a pool with water up to her shoulders. She has no cough.   She has tried increasing dose of advair from 1 puff BID to 2 puffs BID with no improvement. To her this does not feel like asthma.   Not episodic. Feels this really whenever she tries to exert herself.   Last year had left hip and right knee replaced.   She has no family history of lung disease other than grandmother who had emphysema  Smoked for 4 years in distant past. MJ in distant past. No vaping. 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   Past Medical History:  Diagnosis Date   Anemia    Anxiety    Arthritis    Asthma    Cancer ([place])    Depression    Dyspnea    GERD (gastroesophageal reflux disease)    Goiter    History of hiatal hernia    Hypertension      Family History  Problem Relation Age of Onset   Heart disease Mother    Breast cancer Maternal Grandmother    Emphysema Paternal Grandmother        never smoked but chewed tobacco     Past Surgical History:  Procedure Laterality Date    CHOLECYSTECTOMY     FOOT SURGERY     KNEE SURGERY Left    TKA x2    1974, 2014   SHOULDER SURGERY     THYROIDECTOMY N/A 07/05/2021   Procedure: TOTAL THYROIDECTOMY;  Surgeon: [name], [name], MD;  Location: [facility];  Service: General;  Laterality: N/A;   TOTAL HIP ARTHROPLASTY Left    TOTAL KNEE ARTHROPLASTY Right 11/28/2020   Procedure: TOTAL KNEE ARTHROPLASTY;  Surgeon: Paralee Cancel, MD;  Location: [facility];  Service: Orthopedics;  Laterality: Right;   XI ROBOTIC ASSISTED HIATAL HERNIA REPAIR N/A 08/20/2021   Procedure: XI ROBOTIC ASSISTED HIATAL HERNIA REPAIR WITH PARTIAL FUNDOPLICATION AND EGD;  Surgeon: [name], [name], MD;  Location: [facility];  Service: General;  Laterality: N/A;    Social History   Socioeconomic History   Marital status: Married    Spouse name: Not on file   Number of children: Not on file   Years of education: Not on file   Highest education level: Not on file  Occupational History   Not on file  Tobacco Use   Smoking status: Former    Packs/day: 1.00    Years: 4.00    Total pack years: 4.00    Types: Cigarettes    Quit date: 42  Years since quitting: 49.5   Smokeless tobacco: Never  Vaping Use   Vaping Use: Never used  Substance and Sexual Activity   Alcohol use: Yes    Comment: Wine occasional   Drug use: No   Sexual activity: Not on file  Other Topics Concern   Not on file  Social History Narrative   Not on file   Social Determinants of Health   Financial Resource Strain: Not on file  Food Insecurity: Not on file  Transportation Needs: Not on file  Physical Activity: Not on file  Stress: Not on file  Social Connections: Not on file  Intimate Partner Violence: Not on file     Allergies  Allergen Reactions   Mango Flavor [Flavoring Agent] Anaphylaxis    Specifically mango peel , patient can eat the fruit itself   Oxycontin [Oxycodone]     Dizziness     Outpatient Medications Prior to Visit  Medication Sig Dispense  Refill   albuterol (VENTOLIN HFA) 108 (90 Base) MCG/ACT inhaler Inhale 2 puffs into the lungs every 6 (six) hours as needed for wheezing or shortness of breath. 1 each 3   Ascorbic Acid (VITAMIN C) 1000 MG tablet Take 1,000 mg by mouth daily.     Benzocaine (SOLARCAINE EX) Apply 1 application. topically daily as needed (bug bites).     Budeson-Glycopyrrol-Formoterol (BREZTRI AEROSPHERE) 160-9-4.8 MCG/ACT AERO Inhale 2 puffs into the lungs in the morning and at bedtime. 10.7 g 11   cetirizine (ZYRTEC) 10 MG tablet Take 1 tablet (10 mg total) by mouth 2 (two) times daily. (Patient taking differently: Take 10 mg by mouth 2 (two) times daily as needed for allergies.) 60 tablet 3   cholecalciferol (VITAMIN D3) 25 MCG (1000 UNIT) tablet Take 1,000 Units by mouth daily.     fluticasone (FLONASE) 50 MCG/ACT nasal spray Place 1 spray into both nostrils daily. 16 g 6   hydrochlorothiazide (HYDRODIURIL) 25 MG tablet TAKE ONE TABLET BY MOUTH EVERY MORNING 30 tablet 3   HYDROcodone-acetaminophen (NORCO/VICODIN) 5-325 MG tablet Take 1 tablet by mouth every 6 (six) hours as needed for moderate pain. 20 tablet 0   levothyroxine (SYNTHROID) 112 MCG tablet Take 112 mcg by mouth daily.     magnesium oxide (MAG-OX) 400 MG tablet Take 400 mg by mouth daily.     pantoprazole (PROTONIX) 40 MG tablet Take 1 tablet (40 mg total) by mouth daily for 30 doses. 30 tablet 0   sertraline (ZOLOFT) 100 MG tablet TAKE 1.5 TABLETS BY MOUTH EVERY MORNING 45 tablet 3   No facility-administered medications prior to visit.       Objective:   Physical Exam:  General appearance: 66 y.o., female, NAD, conversant  Eyes: anicteric sclerae; PERRL, tracking appropriately HENT: NCAT; MMM Neck: Trachea midline; no lymphadenopathy, no JVD Lungs: CTAB, no crackles, no wheeze, with normal respiratory effort CV: RRR, no murmur  Abdomen: Soft, non-tender; non-distended, BS present  Extremities: No peripheral edema, warm Skin: Normal  turgor and texture; no rash Psych: Appropriate affect Neuro: Alert and oriented to person and place, no focal deficit      There were no vitals filed for this visit.     on RA BMI Readings from Last 3 Encounters:  08/20/21 34.61 kg/m  08/08/21 34.61 kg/m  07/05/21 34.28 kg/m   Wt Readings from Last 3 Encounters:  08/20/21 208 lb (94.3 kg)  08/08/21 208 lb (94.3 kg)  07/05/21 206 lb (93.4 kg)     CBC  Component Value Date/Time   WBC 12.1 (H) 08/20/2021 1705   RBC 4.47 08/20/2021 1705   HGB 13.3 08/20/2021 1705   HGB 12.8 03/26/2021 1114   HGB 9.2 (L) 01/23/2021 0945   HCT 41.3 08/20/2021 1705   HCT 30.8 (L) 01/23/2021 0945   PLT 190 08/20/2021 1705   PLT 232 03/26/2021 1114   PLT 313 01/23/2021 0945   MCV 92.4 08/20/2021 1705   MCV 77 (L) 01/23/2021 0945   MCH 29.8 08/20/2021 1705   MCHC 32.2 08/20/2021 1705   RDW 14.3 08/20/2021 1705   RDW 15.2 01/23/2021 0945   LYMPHSABS 1.1 04/26/2021 1601   LYMPHSABS 1.2 01/23/2021 0945   MONOABS 0.8 04/26/2021 1601   EOSABS 0.3 04/26/2021 1601   EOSABS 0.4 01/23/2021 0945   BASOSABS 0.1 04/26/2021 1601   BASOSABS 0.1 01/23/2021 0945    Eos 200-400  IgE 1000  Chest Imaging: CT Chest 04/06/21 reviewed by me remarkable for large hiatal hernia, a little scar in RML, lingula, bases  CXR 06/26/21 stable relative to prior  CTA Coronaries 07/03/21 reviewed by me lung bases not significantly changed  Pulmonary Functions Testing Results:    Latest Ref Rng & Units 05/14/2021   12:50 PM  PFT Results  FVC-Pre L 2.53   FVC-Predicted Pre % 77   FVC-Post L 2.74   FVC-Predicted Post % 84   Pre FEV1/FVC % % 63   Post FEV1/FCV % % 71   FEV1-Pre L 1.60   FEV1-Predicted Pre % 64   FEV1-Post L 1.94   DLCO uncorrected ml/min/mmHg 23.36   DLCO UNC% % 114   DLCO corrected ml/min/mmHg 24.22   DLCO COR %Predicted % 118   DLVA Predicted % 114   TLC L 5.21   TLC % Predicted % 100   RV % Predicted % 114    Mild obstruction by  post BD FEV1, ++BD response, borderline elevated DLCO  Echocardiogram:  None      Assessment & Plan:   # DOE # Moderate persistent asthma Some improvement in DOE with addition of LAMA to treat her asthma. There are other potential contributors to her DOE. CT Chest with large hiatal hernia but she really doesn't have any evidence of fibrotic lung disease. Doesn't seem anatomically likely that she'd have phrenic nerve compression from hiatal hernia and she has no orthopnea. Goiter does not seem to be compressing trachea on ct chest and she has no hoarseness, breathiness, stridor. Exam was not suggestive of decompensated CHF at last visit. CAD possible, has not had stress testing. Deconditioning post orthopaedic surgery and untreated sleep apnea could also play roles.  Plan: - she'll send message after CTA coronaries obtained for [place] to review lung windows - referral made to allergy in setting elevated IgE for consideration of RAST - she will hold off on scheduling appointment for now since she's feeling a little better - breztri 2 puffs BID with spacer, rinse mouth after use - flonase 1 spray each nare daily after clearing nose of crusting following a shower - zyrtec 10 mg daily - next visit screen for OSA  I spent *** minutes dedicated to the care of this patient on the date of this encounter to include pre-visit review of records, face-to-face time with the patient discussing conditions above, clinical documentation with the electronic health record, and communicating necessary findings to members of the patients care team.     Maryjane Hurter, MD [facility] 09/10/2021 6:47 PM

## 2021-09-12 ENCOUNTER — Ambulatory Visit: Payer: Medicare PPO | Admitting: Internal Medicine

## 2021-09-12 ENCOUNTER — Encounter: Payer: Self-pay | Admitting: Internal Medicine

## 2021-09-12 ENCOUNTER — Ambulatory Visit: Payer: Medicare PPO | Admitting: Student

## 2021-09-12 ENCOUNTER — Encounter: Payer: Self-pay | Admitting: Student

## 2021-09-12 VITALS — BP 124/74 | HR 62 | Temp 98.1°F | Ht 65.0 in | Wt 197.0 lb

## 2021-09-12 VITALS — BP 120/76 | HR 70 | Ht 65.0 in | Wt 198.0 lb

## 2021-09-12 DIAGNOSIS — E890 Postprocedural hypothyroidism: Secondary | ICD-10-CM

## 2021-09-12 DIAGNOSIS — J4540 Moderate persistent asthma, uncomplicated: Secondary | ICD-10-CM | POA: Diagnosis not present

## 2021-09-12 DIAGNOSIS — C73 Malignant neoplasm of thyroid gland: Secondary | ICD-10-CM | POA: Diagnosis not present

## 2021-09-12 NOTE — Patient Instructions (Signed)

## 2021-09-12 NOTE — Patient Instructions (Addendum)
-   We will see you in October - Breztri 2 puffs twice daily with spacer - rinse mouth/gargle and rinse spacer after use - albuterol as needed, zyrtec daily, flonase

## 2021-09-12 NOTE — Progress Notes (Unsigned)
Name: [name]  MRN/ DOB: [id number], [birth year]    Age/ Sex: 66 y.o., female    PCP: Tollie Eth, NP   Reason for Endocrinology Evaluation: MNG     Date of Initial Endocrinology Evaluation: 09/12/2021     HPI: [name] is a 66 y.o. female with a past medical history of Asthma, MNG. The patient presented for initial endocrinology clinic visit on 09/12/2021 for consultative assistance with her MNG.    During evaluation for chronic anemia she was seen by GI and was noted to have a hiatal hernia.  A CT scan was ordered for further evaluation of hiatal hernia and the patient had an incidental 2.2 cm left thyroid nodule, which prompted a thyroid ultrasound showing multinodular goiter.  Per patient she has been diagnosed with multinodular goiter in 2002 She was on LT-for replacement for many years before this was stopped by a physician    She is S/P FNA of the right mid 4.5 cm nodule with a cytology report consistent with suspicious for malignancy ( [facility] Category V) . She is also S/P benign FNA of the left 2.9 cm nodule in  06/2021      SUBJECTIVE:   Today (09/12/21):  [name] is here for a follow up on PTC.  She is accompanied by her husband [name] She is S/P total thyroidectomy 07/05/2021 She is S/P hernia repair 08/20/2021 Denies post-op tingling , was started on calcium   Pt has been noted with weight loss  She has been on pureed food since the hernia sx   Denies constipation or diarrhea  Denies palpitations  Denies tremors   Mother with thyroid disease    Currently on Levothyroxine 112 mgf   HISTORY:  Past Medical History:  Past Medical History:  Diagnosis Date   Anemia    Anxiety    Arthritis    Asthma    Cancer ([facility])    Depression    Dyspnea    GERD (gastroesophageal reflux disease)    Goiter    History of hiatal hernia    Hypertension    Past Surgical History:  Past Surgical History:  Procedure Laterality Date    CHOLECYSTECTOMY     FOOT SURGERY     KNEE SURGERY Left    TKA x2    1974, 2014   SHOULDER SURGERY     THYROIDECTOMY N/A 07/05/2021   Procedure: TOTAL THYROIDECTOMY;  Surgeon: [name], De Blanch, MD;  Location: [facility];  Service: General;  Laterality: N/A;   TOTAL HIP ARTHROPLASTY Left    TOTAL KNEE ARTHROPLASTY Right 11/28/2020   Procedure: TOTAL KNEE ARTHROPLASTY;  Surgeon: Durene Romans, MD;  Location: [facility];  Service: Orthopedics;  Laterality: Right;   XI ROBOTIC ASSISTED HIATAL HERNIA REPAIR N/A 08/20/2021   Procedure: XI ROBOTIC ASSISTED HIATAL HERNIA REPAIR WITH PARTIAL FUNDOPLICATION AND EGD;  Surgeon: [name], De Blanch, MD;  Location: [facility];  Service: General;  Laterality: N/A;    Social History:  reports that she quit smoking about 49 years ago. Her smoking use included cigarettes. She has a 4.00 pack-year smoking history. She has never used smokeless tobacco. She reports current alcohol use. She reports that she does not use drugs. Family History: family history includes Breast cancer in her maternal grandmother; Emphysema in her paternal grandmother; Heart disease in her mother.   HOME MEDICATIONS: Allergies as of 09/12/2021       Reactions   Mango Flavor [flavoring Agent]  Anaphylaxis   Specifically mango peel , patient can eat the fruit itself   Oxycontin [oxycodone]    Dizziness        Medication List        Accurate as of September 12, 2021  2:45 PM. If you have any questions, ask your nurse or doctor.          albuterol 108 (90 Base) MCG/ACT inhaler Commonly known as: VENTOLIN HFA Inhale 2 puffs into the lungs every 6 (six) hours as needed for wheezing or shortness of breath.   Breztri Aerosphere 160-9-4.8 MCG/ACT Aero Generic drug: Budeson-Glycopyrrol-Formoterol Inhale 2 puffs into the lungs in the morning and at bedtime.   cetirizine 10 MG tablet Commonly known as: ZYRTEC Take 1 tablet (10 mg total) by mouth 2 (two) times daily. What changed:  when  to take this reasons to take this   cholecalciferol 25 MCG (1000 UNIT) tablet Commonly known as: VITAMIN D3 Take 1,000 Units by mouth daily.   fluticasone 50 MCG/ACT nasal spray Commonly known as: FLONASE Place 1 spray into both nostrils daily.   hydrochlorothiazide 25 MG tablet Commonly known as: HYDRODIURIL TAKE ONE TABLET BY MOUTH EVERY MORNING   HYDROcodone-acetaminophen 5-325 MG tablet Commonly known as: NORCO/VICODIN Take 1 tablet by mouth every 6 (six) hours as needed for moderate pain.   levothyroxine 112 MCG tablet Commonly known as: SYNTHROID Take 112 mcg by mouth daily.   magnesium oxide 400 MG tablet Commonly known as: MAG-OX Take 400 mg by mouth daily.   pantoprazole 40 MG tablet Commonly known as: PROTONIX Take 1 tablet (40 mg total) by mouth daily for 30 doses.   sertraline 100 MG tablet Commonly known as: ZOLOFT TAKE 1.5 TABLETS BY MOUTH EVERY MORNING   SOLARCAINE EX Apply 1 application. topically daily as needed (bug bites).   vitamin C 1000 MG tablet Take 1,000 mg by mouth daily.          REVIEW OF SYSTEMS: A comprehensive ROS was conducted with the patient and is negative except as per HPI    OBJECTIVE:  VS: BP 120/76 (BP Location: Left Arm, Patient Position: Sitting, Cuff Size: Small)   Pulse 70   Ht $R'5\' 5"'hm$  (1.651 m)   Wt 198 lb (89.8 kg)   LMP 08/08/2010   SpO2 95%   BMI 32.95 kg/m    Wt Readings from Last 3 Encounters:  09/12/21 198 lb (89.8 kg)  09/12/21 197 lb (89.4 kg)  08/20/21 208 lb (94.3 kg)     EXAM: General: Pt appears well and is in NAD  Eyes: External eye exam normal without stare, lid lag or exophthalmos.  EOM intact.    Neck: General: Supple without adenopathy. Thyroid: Prominent thyroid gland, left thyroid nodule palpated, but I was only able to palpate asymmetry on the right   Lungs: Clear with good BS bilat with no rales, rhonchi, or wheezes  Heart: Auscultation: RRR.  Abdomen: Normoactive bowel sounds,  soft, nontender, without masses or organomegaly palpable  Extremities:  BL LE: No pretibial edema normal ROM and strength.  Mental Status: Judgment, insight: Intact Orientation: Oriented to time, place, and person Mood and affect: No depression, anxiety, or agitation     DATA REVIEWED:  Latest Reference Range & Units 06/07/21 15:23  TSH 0.40 - 4.50 mIU/L 0.64  Triiodothyronine (T3) 76 - 181 ng/dL 106  T4,Free(Direct) 0.8 - 1.8 ng/dL 0.9    SURGICAL PATHOLOGY  CASE: [id number]  PATIENT: [name]  Surgical Pathology Report  Clinical History: Suspicious for malignancy ([facility])      FINAL MICROSCOPIC DIAGNOSIS:   A. THYROID, TOTAL THYROIDECTOMY:  Papillary carcinoma, oncocytic variant, of right lobe (see comment)  Tumor measures 3.8 x 3.1 x 2.1 cm  Margins free  Background of multinodular goiter with dominant nodule within the left  lobe   Comment:  Sections of the right thyroid show a lobulated circumscribed thinly  encapsulated nodule composed of variably sized follicular groups showing  follicular cells with abundant granular cytoplasm all within a fibrotic  focally hyalinized fibrovascular stroma.  These cells show a variable  but moderate nuclear enlargement and cytologic atypia.  Many of the  cells show enlarged nuclei with clearing and margination of the nuclear  chromatin and scattered small inconspicuous nucleoli.  Although no  overlap is seen, the cells show frequent nuclear grooves, and nuclear  pseudoinclusions are readily identified.  There is scant associated  colloid.  Scattered calcifications and psammoma bodies are seen.  These  findings are consistent with a papillary carcinoma with oncocytic  features.   ONCOLOGY TABLE   THYROID GLAND, CARCINOMA: Resection   Procedure: Total thyroidectomy  Tumor Focality: Unifocal  Tumor Site: Right lobe  Tumor Size: 3.8 x 3.1 x 2.1 cm  Histologic Type: Papillary carcinoma, oncocytic variant   Angioinvasion: Not identified  Lymphatic Invasion: Not identified  Extrathyroidal Extension: Not identified  Margin Status: All margins negative for invasive carcinoma  Regional Lymph Node Status: Not applicable (no regional lymph nodes  submitted or found)  Distant Metastasis: Not applicable  Pathologic Stage Classification (pTNM, AJCC 8th Edition): pT2, pN n/a    Old records , labs and images have been reviewed.     ASSESSMENT/PLAN/RECOMMENDATIONS:   Papillary thyroid carcinoma:   -Patient is status post total thyroidectomy on Jul 05, 2021, oncocytic variant, unifocal, 3.8 cm at max diameter, angioinvasion not identified, NO lymph nodes where submitted -At this time the patient is at low risk for recurrence -Given oncocytic variant and in review of the literature there has been significant controversy about the biological behavior and optimal management of oncocytic- PTC variant but overall they have similar prognosis to non- oncocytic PTC.  - RAI ablation use in the setting of oncocytic PTC is variable due to the suggestion that there is limited uptake of RAI and therefore limited benefit -Given the above information and tumor size <4 centimeter there is no indication for our AI at this time -TSH goal 0.5-2 u IU/mL -We will proceed with TG, TG AB -We will consider thyroid bed ultrasound at the 35-month mark postoperatively   2.  Postoperative hypothyroidism  -TSH above goal -- Pt educated extensively on the correct way to take levothyroxine (first thing in the morning with water, 30 minutes before eating or taking other medications). - Pt encouraged to double dose the following day if she were to miss a dose given long half-life of levothyroxine. -We will adjust levothyroxine dose as below   Medication Stop levothyroxine 112 mcg Start levothyroxine 150 mcg daily   Repeat TSH in 6 weeks     Signed electronically by: Mack Guise, MD  [facility]  Endocrinology  [facility] [address] [place], [place] [address] Phone: [phone] FAX: [phone]   CC: Orma Render, NP [address] [place] [address] Phone: [phone] Fax: [phone]   Return to Endocrinology clinic as below: Future Appointments  Date Time Provider [place]  09/12/2021  3:00 PM Nnenna Meador, Mammie Lorenzo  Amedeo Kinsman, MD [facility] None

## 2021-09-13 ENCOUNTER — Telehealth: Payer: Self-pay | Admitting: Internal Medicine

## 2021-09-13 LAB — TSH: TSH: 9.29 u[IU]/mL — ABNORMAL HIGH (ref 0.35–5.50)

## 2021-09-13 MED ORDER — LEVOTHYROXINE SODIUM 150 MCG PO TABS: 150.0000 ug | ORAL_TABLET | Freq: Every day | ORAL | 3 refills | Status: DC

## 2021-09-13 NOTE — Telephone Encounter (Signed)
Please let the patient know that her thyroid test is too high, which means she is not on enough levothyroxine.   Please asked the patient to stop current dose of levothyroxine 112 and start levothyroxine 150 mcg daily   Please schedule her for a lab appointment in 6 weeks    Thanks

## 2021-09-14 LAB — THYROGLOBULIN LEVEL: Thyroglobulin: 0.1 ng/mL — ABNORMAL LOW

## 2021-09-14 LAB — THYROGLOBULIN ANTIBODY: Thyroglobulin Ab: <1 IU/mL (ref <=1)

## 2021-10-01 ENCOUNTER — Encounter: Payer: Self-pay | Admitting: Internal Medicine

## 2021-10-03 DIAGNOSIS — H2513 Age-related nuclear cataract, bilateral: Secondary | ICD-10-CM | POA: Diagnosis not present

## 2021-10-03 DIAGNOSIS — H353131 Nonexudative age-related macular degeneration, bilateral, early dry stage: Secondary | ICD-10-CM | POA: Diagnosis not present

## 2021-10-03 DIAGNOSIS — H43811 Vitreous degeneration, right eye: Secondary | ICD-10-CM | POA: Diagnosis not present

## 2021-10-10 ENCOUNTER — Other Ambulatory Visit (HOSPITAL_BASED_OUTPATIENT_CLINIC_OR_DEPARTMENT_OTHER): Payer: Self-pay | Admitting: Nurse Practitioner

## 2021-10-25 ENCOUNTER — Encounter (HOSPITAL_BASED_OUTPATIENT_CLINIC_OR_DEPARTMENT_OTHER): Payer: Self-pay | Admitting: Nurse Practitioner

## 2021-10-25 ENCOUNTER — Other Ambulatory Visit (INDEPENDENT_AMBULATORY_CARE_PROVIDER_SITE_OTHER): Payer: Medicare PPO

## 2021-10-25 DIAGNOSIS — R5383 Other fatigue: Secondary | ICD-10-CM

## 2021-10-25 DIAGNOSIS — E890 Postprocedural hypothyroidism: Secondary | ICD-10-CM

## 2021-10-25 LAB — TSH: TSH: 2.87 u[IU]/mL (ref 0.35–5.50)

## 2021-10-26 ENCOUNTER — Other Ambulatory Visit (HOSPITAL_BASED_OUTPATIENT_CLINIC_OR_DEPARTMENT_OTHER): Payer: Self-pay | Admitting: Nurse Practitioner

## 2021-10-26 DIAGNOSIS — R5383 Other fatigue: Secondary | ICD-10-CM | POA: Diagnosis not present

## 2021-10-27 LAB — CBC WITH DIFFERENTIAL/PLATELET
Basophils Absolute: 0.1 10*3/uL (ref 0.0–0.2)
Basos: 1 %
EOS (ABSOLUTE): 0.3 10*3/uL (ref 0.0–0.4)
Eos: 4 %
Hematocrit: 40.2 % (ref 34.0–46.6)
Hemoglobin: 13.4 g/dL (ref 11.1–15.9)
Immature Grans (Abs): 0.1 10*3/uL (ref 0.0–0.1)
Immature Granulocytes: 1 %
Lymphocytes Absolute: 1.2 10*3/uL (ref 0.7–3.1)
Lymphs: 14 %
MCH: 29.4 pg (ref 26.6–33.0)
MCHC: 33.3 g/dL (ref 31.5–35.7)
MCV: 88 fL (ref 79–97)
Monocytes Absolute: 0.6 10*3/uL (ref 0.1–0.9)
Monocytes: 7 %
Neutrophils Absolute: 6.3 10*3/uL (ref 1.4–7.0)
Neutrophils: 73 %
Platelets: 215 10*3/uL (ref 150–450)
RBC: 4.56 x10E6/uL (ref 3.77–5.28)
RDW: 13.5 % (ref 11.7–15.4)
WBC: 8.5 10*3/uL (ref 3.4–10.8)

## 2021-10-27 LAB — IRON AND TIBC
Iron Saturation: 20 % (ref 15–55)
Iron: 78 ug/dL (ref 27–139)
Total Iron Binding Capacity: 400 ug/dL (ref 250–450)
UIBC: 322 ug/dL (ref 118–369)

## 2021-10-27 LAB — VITAMIN B12: Vitamin B-12: 536 pg/mL (ref 232–1245)

## 2021-10-27 LAB — FERRITIN: Ferritin: 57 ng/mL (ref 15–150)

## 2021-10-27 LAB — FOLATE: Folate: 10.7 ng/mL (ref >3.0)

## 2021-10-27 LAB — VITAMIN D 25 HYDROXY (VIT D DEFICIENCY, FRACTURES): Vit D, 25-Hydroxy: 40.8 ng/mL (ref 30.0–100.0)

## 2021-10-31 NOTE — Telephone Encounter (Signed)
Labs drawn and resulted.

## 2021-11-03 ENCOUNTER — Other Ambulatory Visit (HOSPITAL_BASED_OUTPATIENT_CLINIC_OR_DEPARTMENT_OTHER): Payer: Self-pay | Admitting: Nurse Practitioner

## 2021-11-16 ENCOUNTER — Encounter (HOSPITAL_BASED_OUTPATIENT_CLINIC_OR_DEPARTMENT_OTHER): Payer: Self-pay | Admitting: Nurse Practitioner

## 2021-11-16 ENCOUNTER — Ambulatory Visit (INDEPENDENT_AMBULATORY_CARE_PROVIDER_SITE_OTHER): Payer: Medicare PPO | Admitting: Nurse Practitioner

## 2021-11-16 VITALS — BP 116/68 | HR 69 | Ht 64.0 in | Wt 200.0 lb

## 2021-11-16 DIAGNOSIS — F413 Other mixed anxiety disorders: Secondary | ICD-10-CM

## 2021-11-16 DIAGNOSIS — Z0000 Encounter for general adult medical examination without abnormal findings: Secondary | ICD-10-CM

## 2021-11-16 DIAGNOSIS — Z23 Encounter for immunization: Secondary | ICD-10-CM

## 2021-11-16 MED ORDER — ALPRAZOLAM 0.25 MG PO TABS: ORAL_TABLET | ORAL | 2 refills | Status: DC

## 2021-11-16 MED ORDER — BUSPIRONE HCL 7.5 MG PO TABS: 7.5000 mg | ORAL_TABLET | Freq: Two times a day (BID) | ORAL | 3 refills | Status: DC

## 2021-11-16 NOTE — Patient Instructions (Addendum)
I have sent in the xanax for you to take at times when you feel like your anxiety is out of control or in situations when you know that anxiety is going to be provoked.  You can take this up to twice in a day.   I have also sent in [place] [facility]. You can start this twice a day (once in the morning and once in the evening) every day for best management. I want to check in on you in about 4-6 weeks and see how you are feeling.   If you have any concerns please let me know.

## 2021-11-16 NOTE — Progress Notes (Signed)
BP 116/68   Pulse 69   Ht '5\' 4"'$  (1.626 m)   Wt 200 lb (90.7 kg)   LMP 08/08/2010   SpO2 98%   BMI 34.33 kg/m    Subjective:    Patient ID: [name], female    DOB: 08-03-1955, 66 y.o.   MRN: [id number]  HPI: [name] is a 66 y.o. female presenting on 11/16/2021 for comprehensive medical examination.   Current medical concerns include: Anxiety - she tells me that when she is driving or a passenger in a car she tells me she feels very anxious and worried about accidents.  - she also tells me that she get anxious when she is in large groups - she tells me she feels like the anxiety is underlying all the time, but it does not manifest itself as bad until she is in the above circumstances.  - she is taking sertraline daily at '150mg'$  - she is planning on going to [place] this fall on vacation.   She reports regular vision exams q1-5y: yes She reports regular dental exams q 65m yes Her diet consists of:  healthy She endorses exercise and/or activity of:  none She works in:  Retired professor  She endorses ETOH use ( 2/wk ) She denies nictoine use  She denies illegal substance use   She is not having menstrual periods.  She  denies abnormal bleeding. She  denies menopausal symptoms.  She denies concerns today about STI: testing ordered: no  She denies concerns about skin changes today  She denies concerns about bowel changes today  She denies concerns about bladder changes today   Most Recent Depression Screen:     11/16/2021    8:43 AM 01/23/2021   12:11 PM  Depression screen PHQ 2/9  Decreased Interest 0 0  Down, Depressed, Hopeless 0 0  PHQ - 2 Score 0 0  Altered sleeping 0 1  Tired, decreased energy 0 1  Change in appetite  1  Feeling bad or failure about yourself  0 0  Trouble concentrating 0 0  Moving slowly or fidgety/restless 0 0  Suicidal thoughts 0 0  PHQ-9 Score 0 3  Difficult doing work/chores  Not difficult at all   Most Recent Anxiety  Screen:     01/23/2021   12:11 PM  GAD 7 : Generalized Anxiety Score  Nervous, Anxious, on Edge 2  Control/stop worrying 0  Worry too much - different things 0  Trouble relaxing 0  Restless 0  Easily annoyed or irritable 2  Afraid - awful might happen 0  Total GAD 7 Score 4  Anxiety Difficulty Somewhat difficult   Most Recent Fall Screen:    11/16/2021    8:43 AM 01/23/2021   12:11 PM  Fall Risk   Falls in the past year? 0 1  Number falls in past yr: 0 0  Injury with Fall? 0 0  Risk for fall due to : No Fall Risks Orthopedic patient  Follow up Falls evaluation completed;Education provided Falls evaluation completed;Education provided    All ROS negative except what is listed above and in the HPI.   Past medical history, surgical history, medications, allergies, family history and social history reviewed with patient today and changes made to appropriate areas of the chart.  Past Medical History:  Past Medical History:  Diagnosis Date   Anemia    Anxiety    Arthritis    Asthma    [place]  Depression    Dyspnea    GERD (gastroesophageal reflux disease)    Goiter    History of hiatal hernia    Hypertension    Medications:  Current Outpatient Medications on File Prior to Visit  Medication Sig   albuterol (VENTOLIN HFA) 108 (90 Base) MCG/ACT inhaler Inhale 2 puffs into the lungs every 6 (six) hours as needed for wheezing or shortness of breath.   Ascorbic Acid (VITAMIN C) 1000 MG tablet Take 1,000 mg by mouth daily.   Benzocaine (SOLARCAINE EX) Apply 1 application. topically daily as needed (bug bites).   Budeson-Glycopyrrol-Formoterol (BREZTRI AEROSPHERE) 160-9-4.8 MCG/ACT AERO Inhale 2 puffs into the lungs in the morning and at bedtime.   cetirizine (ZYRTEC) 10 MG tablet TAKE ONE TABLET BY MOUTH TWICE A DAY   cholecalciferol (VITAMIN D3) 25 MCG (1000 UNIT) tablet Take 1,000 Units by mouth daily.   fluticasone (FLONASE) 50 MCG/ACT nasal spray Place 1 spray  into both nostrils daily.   hydrochlorothiazide (HYDRODIURIL) 25 MG tablet TAKE ONE TABLET BY MOUTH EVERY MORNING   levothyroxine (SYNTHROID) 150 MCG tablet Take 1 tablet (150 mcg total) by mouth daily.   magnesium oxide (MAG-OX) 400 MG tablet Take 400 mg by mouth daily.   sertraline (ZOLOFT) 100 MG tablet TAKE 1.5 TABLETS BY MOUTH EVERY MORNING   pantoprazole (PROTONIX) 40 MG tablet Take 1 tablet (40 mg total) by mouth daily for 30 doses.   No current facility-administered medications on file prior to visit.   Surgical History:  Past Surgical History:  Procedure Laterality Date   CHOLECYSTECTOMY     FOOT SURGERY     KNEE SURGERY Left    TKA x2    1974, 2014   SHOULDER SURGERY     THYROIDECTOMY N/A 07/05/2021   Procedure: TOTAL THYROIDECTOMY;  Surgeon: [name], [name], MD;  Location: [facility];  Service: General;  Laterality: N/A;   TOTAL HIP ARTHROPLASTY Left    TOTAL KNEE ARTHROPLASTY Right 11/28/2020   Procedure: TOTAL KNEE ARTHROPLASTY;  Surgeon: Paralee Cancel, MD;  Location: [facility];  Service: Orthopedics;  Laterality: Right;   XI ROBOTIC ASSISTED HIATAL HERNIA REPAIR N/A 08/20/2021   Procedure: XI ROBOTIC ASSISTED HIATAL HERNIA REPAIR WITH PARTIAL FUNDOPLICATION AND EGD;  Surgeon: [name], [name], MD;  Location: [facility];  Service: General;  Laterality: N/A;   Allergies:  Allergies  Allergen Reactions   Mango Flavor [Flavoring Agent] Anaphylaxis    Specifically mango peel , patient can eat the fruit itself   Oxycontin [Oxycodone]     Dizziness   Social History:  Social History   Socioeconomic History   Marital status: Married    Spouse name: Not on file   Number of children: Not on file   Years of education: Not on file   Highest education level: Not on file  Occupational History   Not on file  Tobacco Use   Smoking status: Former    Packs/day: 1.00    Years: 4.00    Total pack years: 4.00    Types: Cigarettes    Quit date: 30    Years since quitting:  49.7   Smokeless tobacco: Never  Vaping Use   Vaping Use: Never used  Substance and Sexual Activity   Alcohol use: Yes    Comment: Wine occasional   Drug use: No   Sexual activity: Not on file  Other Topics Concern   Not on file  Social History Narrative   Not on file   Social  Determinants of Health   Financial Resource Strain: Not on file  Food Insecurity: Not on file  Transportation Needs: Not on file  Physical Activity: Not on file  Stress: Not on file  Social Connections: Not on file  Intimate Partner Violence: Not on file   Social History   Tobacco Use  Smoking Status Former   Packs/day: 1.00   Years: 4.00   Total pack years: 4.00   Types: Cigarettes   Quit date: 47   Years since quitting: 49.7  Smokeless Tobacco Never   Social History   Substance and Sexual Activity  Alcohol Use Yes   Comment: Wine occasional   Family History:  Family History  Problem Relation Age of Onset   Heart disease Mother    Breast cancer Maternal Grandmother    Emphysema Paternal Grandmother        never smoked but chewed tobacco       Objective:    BP 116/68   Pulse 69   Ht '5\' 4"'$  (1.626 m)   Wt 200 lb (90.7 kg)   LMP 08/08/2010   SpO2 98%   BMI 34.33 kg/m   Wt Readings from Last 3 Encounters:  11/16/21 200 lb (90.7 kg)  09/12/21 198 lb (89.8 kg)  09/12/21 197 lb (89.4 kg)    Physical Exam Vitals and nursing note reviewed.  Constitutional:      General: She is not in acute distress.    Appearance: Normal appearance.  HENT:     Head: Normocephalic and atraumatic.     Right Ear: Hearing, tympanic membrane, ear canal and external ear normal.     Left Ear: Hearing, tympanic membrane, ear canal and external ear normal.     Nose: Nose normal.     Right Sinus: No maxillary sinus tenderness or frontal sinus tenderness.     Left Sinus: No maxillary sinus tenderness or frontal sinus tenderness.     Mouth/Throat:     Lips: Pink.     Mouth: Mucous membranes are  moist.     Pharynx: Oropharynx is clear.  Eyes:     General: Lids are normal. Vision grossly intact.     Extraocular Movements: Extraocular movements intact.     Conjunctiva/sclera: Conjunctivae normal.     Pupils: Pupils are equal, round, and reactive to light.     Funduscopic exam:    Right eye: Red reflex present.        Left eye: Red reflex present.    Visual Fields: Right eye visual fields normal and left eye visual fields normal.  Neck:     Thyroid: No thyromegaly.     Vascular: No carotid bruit.  Cardiovascular:     Rate and Rhythm: Normal rate and regular rhythm.     Chest Wall: PMI is not displaced.     Pulses: Normal pulses.          Dorsalis pedis pulses are 2+ on the right side and 2+ on the left side.       Posterior tibial pulses are 2+ on the right side and 2+ on the left side.     Heart sounds: Normal heart sounds. No murmur heard. Pulmonary:     Effort: Pulmonary effort is normal. No respiratory distress.     Breath sounds: Normal breath sounds.  Abdominal:     General: Abdomen is flat. Bowel sounds are normal. There is no distension.     Palpations: Abdomen is soft. There is no hepatomegaly, splenomegaly or  mass.     Tenderness: There is no abdominal tenderness. There is no right CVA tenderness, left CVA tenderness, guarding or rebound.  Musculoskeletal:        General: Normal range of motion.     Cervical back: Full passive range of motion without pain, normal range of motion and neck supple. No tenderness.     Right lower leg: No edema.     Left lower leg: No edema.  Feet:     Left foot:     Toenail Condition: Left toenails are normal.  Lymphadenopathy:     Cervical: No cervical adenopathy.     Upper Body:     Right upper body: No supraclavicular adenopathy.     Left upper body: No supraclavicular adenopathy.  Skin:    General: Skin is warm and dry.     Capillary Refill: Capillary refill takes less than 2 seconds.     Nails: There is no clubbing.   Neurological:     General: No focal deficit present.     Mental Status: She is alert and oriented to person, place, and time.     GCS: GCS eye subscore is 4. GCS verbal subscore is 5. GCS motor subscore is 6.     Sensory: Sensation is intact.     Motor: Motor function is intact.     Coordination: Coordination is intact.     Gait: Gait is intact.     Deep Tendon Reflexes: Reflexes are normal and symmetric.  Psychiatric:        Attention and Perception: Attention normal.        Mood and Affect: Mood normal.        Speech: Speech normal.        Behavior: Behavior normal. Behavior is cooperative.        Thought Content: Thought content normal.        Cognition and Memory: Cognition and memory normal.        Judgment: Judgment normal.     Results for orders placed or performed in visit on 10/26/21  CBC with Differential/Platelet  Result Value Ref Range   WBC 8.5 3.4 - 10.8 x10E3/uL   RBC 4.56 3.77 - 5.28 x10E6/uL   Hemoglobin 13.4 11.1 - 15.9 g/dL   Hematocrit 40.2 34.0 - 46.6 %   MCV 88 79 - 97 fL   MCH 29.4 26.6 - 33.0 pg   MCHC 33.3 31.5 - 35.7 g/dL   RDW 13.5 11.7 - 15.4 %   Platelets 215 150 - 450 x10E3/uL   Neutrophils 73 Not Estab. %   Lymphs 14 Not Estab. %   Monocytes 7 Not Estab. %   Eos 4 Not Estab. %   Basos 1 Not Estab. %   Neutrophils Absolute 6.3 1.4 - 7.0 x10E3/uL   Lymphocytes Absolute 1.2 0.7 - 3.1 x10E3/uL   Monocytes Absolute 0.6 0.1 - 0.9 x10E3/uL   EOS (ABSOLUTE) 0.3 0.0 - 0.4 x10E3/uL   Basophils Absolute 0.1 0.0 - 0.2 x10E3/uL   Immature Granulocytes 1 Not Estab. %   Immature Grans (Abs) 0.1 0.0 - 0.1 x10E3/uL  Iron and TIBC  Result Value Ref Range   Total Iron Binding Capacity 400 250 - 450 ug/dL   UIBC 322 118 - 369 ug/dL   Iron 78 27 - 139 ug/dL   Iron Saturation 20 15 - 55 %  Folate  Result Value Ref Range   Folate 10.7 >3.0 ng/mL  VITAMIN D 25 Hydroxy (Vit-D Deficiency,  Fractures)  Result Value Ref Range   Vit D, 25-Hydroxy 40.8 30.0 -  100.0 ng/mL  Vitamin B12  Result Value Ref Range   Vitamin B-12 536 232 - 1,245 pg/mL  Ferritin  Result Value Ref Range   Ferritin 57 15 - 150 ng/mL    MMUNIZATIONS:   - Tdap: Tetanus vaccination status reviewed: tetanus status unknown to the patient. - Influenza: Administered today - Pneumovax: Declined - Prevnar: Not applicable - HPV: Not applicable - Zostavax vaccine: Declined  SCREENING COMPLETED: - Pap smear: Not Applicable - STI testing:Declined -Mammogram:  utd - Colonoscopy: { utd - Bone Density: { utd -Hearing Test: Not Applicable -Spirometry: Not Applicable     Assessment & Plan:   Problem List Items Addressed This Visit     Annual physical exam - Primary    CPE today. Anxiety noted and treatment changed.  Labs completed Health maintenance recommendations discussed. Anticipatory guidance provided.  Will plan to f/u in 1 year with CPE and sooner if needed for acute issues.       Other mixed anxiety disorders    Increased anxiety particularly centered around riding in the car and when in groups. No clear trigger. She is on sertraline and doing fairly well at baseline. Discussion with addition of buspar as PRN or daily medication to help with daily sx and PRN xanax for when she is in a vehicle as passenger. She would like to try these. Recommend not taking xanax on daily basis and only in severe situations due to increased risk of sleepiness, falls, memory issues, and dependence. Will f/u if sx do not improve       Relevant Medications   ALPRAZolam (XANAX) 0.25 MG tablet   busPIRone (BUSPAR) 7.5 MG tablet   Other Visit Diagnoses     Healthcare maintenance       Flu vaccine need       Relevant Orders   Flu Vaccine QUAD 6+ mos PF IM (Fluarix Quad PF) (Completed)          Follow up plan: Return in about 6 weeks (around 12/28/2021) for virtual visit check-in for mood.  NEXT PREVENTATIVE PHYSICAL DUE IN 1 YEAR.  PATIENT COUNSELING PROVIDED FOR ALL  ADULT PATIENTS:  Consume a well balanced diet low in saturated fats, cholesterol, and moderation in carbohydrates.   This can be as simple as monitoring portion sizes and cutting back on sugary beverages such as soda and juice to start with.    Daily water consumption of at least 64 ounces.  Physical activity at least 180 minutes per week, if just starting out.   This can be as simple as taking the stairs instead of the elevator and walking 2-3 laps around the office  purposefully every day.   STD protection, partner selection, and regular testing if high risk.  Limited consumption of alcoholic beverages if alcohol is consumed.  For women, I recommend no more than 7 alcoholic beverages per week, spread out throughout the week.  Avoid "binge" drinking or consuming large quantities of alcohol in one setting.   Please let me know if you feel you may need help with reduction or quitting alcohol consumption.   Avoidance of nicotine, if used.  Please let me know if you feel you may need help with reduction or quitting nicotine use.   Daily mental health attention.  This can be in the form of 5 minute daily meditation, prayer, journaling, yoga, reflection, etc.   Purposeful attention to your  emotions and mental state can significantly improve your overall wellbeing and Health.  Please know that I am here to help you with all of your health care goals and am happy to work with you to find a solution that works best for you.  The greatest advice I have received with any changes in life are to take it one step at a time, that even means if all you can focus on is the next 60 seconds, then do that and celebrate your victories.  With any changes in life, you will have set backs, and that is OK. The important thing to remember is, if you have a set back, it is not a failure, it is an opportunity to try again!  Health Maintenance Recommendations Screening Testing Mammogram Every 1 -2 years based on  history and risk factors Starting at age 61 Pap Smear Ages 21-39 every 3 years Ages 79-65 every 5 years with HPV testing More frequent testing may be required based on results and history Colon Cancer Screening Every 1-10 years based on test performed, risk factors, and history Starting at age 24 Bone Density Screening Every 2-10 years based on history Starting at age 37 for women Recommendations for men differ based on medication usage, history, and risk factors AAA Screening One time ultrasound Men 80-40 years old who have every smoked Lung Cancer Screening Low Dose Lung CT every 12 months Age 19-80 years with a 30 pack-year smoking history who still smoke or who have quit within the last 15 years  Screening Labs Routine  Labs: Complete Blood Count (CBC), Complete Metabolic Panel (CMP), Cholesterol (Lipid Panel) Every 6-12 months based on history and medications May be recommended more frequently based on current conditions or previous results Hemoglobin A1c Lab Every 3-12 months based on history and previous results Starting at age 54 or earlier with diagnosis of diabetes, high cholesterol, BMI >26, and/or risk factors Frequent monitoring for patients with diabetes to ensure blood sugar control Thyroid Panel (TSH w/ T3 & T4) Every 6 months based on history, symptoms, and risk factors May be repeated more often if on medication HIV One time testing for all patients 48 and older May be repeated more frequently for patients with increased risk factors or exposure Hepatitis C One time testing for all patients 74 and older May be repeated more frequently for patients with increased risk factors or exposure Gonorrhea, Chlamydia Every 12 months for all sexually active persons 13-24 years Additional monitoring may be recommended for those who are considered high risk or who have symptoms PSA Men 40-34 years old with risk factors Additional screening may be recommended from age  39-69 based on risk factors, symptoms, and history  Vaccine Recommendations Tetanus Booster All adults every 10 years Flu Vaccine All patients 6 months and older every year COVID Vaccine All patients 12 years and older Initial dosing with booster May recommend additional booster based on age and health history HPV Vaccine 2 doses all patients age 28-26 Dosing may be considered for patients over 26 Shingles Vaccine (Shingrix) 2 doses all adults 5 years and older Pneumonia (Pneumovax 23) All adults 58 years and older May recommend earlier dosing based on health history Pneumonia (Prevnar 52) All adults 82 years and older Dosed 1 year after Pneumovax 23  Additional Screening, Testing, and Vaccinations may be recommended on an individualized basis based on family history, health history, risk factors, and/or exposure.

## 2021-11-29 DIAGNOSIS — F413 Other mixed anxiety disorders: Secondary | ICD-10-CM | POA: Insufficient documentation

## 2021-11-29 NOTE — Assessment & Plan Note (Signed)
CPE today. Anxiety noted and treatment changed.  Labs completed Health maintenance recommendations discussed. Anticipatory guidance provided.  Will plan to f/u in 1 year with CPE and sooner if needed for acute issues.

## 2021-11-29 NOTE — Assessment & Plan Note (Signed)
Increased anxiety particularly centered around riding in the car and when in groups. No clear trigger. She is on sertraline and doing fairly well at baseline. Discussion with addition of buspar as PRN or daily medication to help with daily sx and PRN xanax for when she is in a vehicle as passenger. She would like to try these. Recommend not taking xanax on daily basis and only in severe situations due to increased risk of sleepiness, falls, memory issues, and dependence. Will f/u if sx do not improve

## 2021-12-07 ENCOUNTER — Ambulatory Visit: Payer: Medicare PPO | Admitting: Internal Medicine

## 2021-12-11 ENCOUNTER — Other Ambulatory Visit: Payer: Self-pay | Admitting: Orthopedic Surgery

## 2021-12-11 DIAGNOSIS — M25512 Pain in left shoulder: Secondary | ICD-10-CM

## 2021-12-11 HISTORY — DX: Pain in left shoulder: M25.512

## 2021-12-19 ENCOUNTER — Ambulatory Visit: Payer: Medicare PPO | Admitting: Student

## 2021-12-21 ENCOUNTER — Ambulatory Visit
Admission: RE | Admit: 2021-12-21 | Discharge: 2021-12-21 | Disposition: A | Discharge status: Home / Self Care | Payer: Medicare PPO | Source: Ambulatory Visit | Attending: Orthopedic Surgery | Admitting: Orthopedic Surgery

## 2021-12-21 DIAGNOSIS — M25512 Pain in left shoulder: Secondary | ICD-10-CM | POA: Diagnosis not present

## 2021-12-25 ENCOUNTER — Other Ambulatory Visit: Payer: Medicare PPO

## 2021-12-28 ENCOUNTER — Ambulatory Visit (HOSPITAL_BASED_OUTPATIENT_CLINIC_OR_DEPARTMENT_OTHER): Payer: Medicare PPO | Admitting: Nurse Practitioner

## 2022-01-03 ENCOUNTER — Telehealth: Payer: Self-pay

## 2022-01-03 ENCOUNTER — Encounter (HOSPITAL_BASED_OUTPATIENT_CLINIC_OR_DEPARTMENT_OTHER): Payer: Self-pay | Admitting: Nurse Practitioner

## 2022-01-03 ENCOUNTER — Encounter: Payer: Self-pay | Admitting: Student

## 2022-01-03 ENCOUNTER — Other Ambulatory Visit: Payer: Self-pay | Admitting: Nurse Practitioner

## 2022-01-03 DIAGNOSIS — Z1231 Encounter for screening mammogram for malignant neoplasm of breast: Secondary | ICD-10-CM

## 2022-01-03 NOTE — Telephone Encounter (Signed)
Pt lvm requesting surgery clearance paperwork. Pt contacted and advised Dr Kelton Pillar is out of the office 01/03/22-01/04/22 and will not be back in the office until 01/07/22 to review paperwork.

## 2022-01-04 ENCOUNTER — Ambulatory Visit (INDEPENDENT_AMBULATORY_CARE_PROVIDER_SITE_OTHER): Payer: Medicare PPO

## 2022-01-04 ENCOUNTER — Encounter (HOSPITAL_BASED_OUTPATIENT_CLINIC_OR_DEPARTMENT_OTHER): Payer: Self-pay

## 2022-01-04 DIAGNOSIS — Z0000 Encounter for general adult medical examination without abnormal findings: Secondary | ICD-10-CM

## 2022-01-04 NOTE — Progress Notes (Signed)
I connected with  [name] on 01/04/22 by a audio enabled telemedicine application and verified that I am speaking with the correct person using two identifiers.  Patient Location: Home  Provider Location: Office/Clinic  I discussed the limitations of evaluation and management by telemedicine. The patient expressed understanding and agreed to proceed.  Subjective:   [name] is a 66 y.o. female who presents for an Initial Medicare Annual Wellness Visit.       Objective:    Today's Vitals   01/04/22 1106  PainSc: 5    There is no height or weight on file to calculate BMI.     08/20/2021   10:49 AM 08/08/2021   10:11 AM 07/05/2021    3:10 PM 07/05/2021    3:00 PM 06/26/2021    7:59 AM 03/26/2021   11:33 AM 02/12/2021    2:15 PM  Advanced Directives  Does Patient Have a Medical Advance Directive? Yes Yes  No Yes Yes No  Type of Paramedic of [place];Living will Living will;Healthcare Power of [place];Living will [place]   Does patient want to make changes to medical advance directive? No - Patient declined        Copy of [place] in Chart? No - copy requested        Would patient like information on creating a medical advance directive?   No - Patient declined    No - Patient declined    Current Medications (verified) Outpatient Encounter Medications as of 01/04/2022  Medication Sig   albuterol (VENTOLIN HFA) 108 (90 Base) MCG/ACT inhaler Inhale 2 puffs into the lungs every 6 (six) hours as needed for wheezing or shortness of breath.   ALPRAZolam (XANAX) 0.25 MG tablet Take 1/2 tab to 1 tab as needed for panic/anxiety up to two times in a day.   Ascorbic Acid (VITAMIN C) 1000 MG tablet Take 1,000 mg by mouth daily.   Benzocaine (SOLARCAINE EX) Apply 1 application. topically daily as needed (bug bites).   Budeson-Glycopyrrol-Formoterol (BREZTRI AEROSPHERE) 160-9-4.8 MCG/ACT  AERO Inhale 2 puffs into the lungs in the morning and at bedtime.   busPIRone (BUSPAR) 7.5 MG tablet Take 1 tablet (7.5 mg total) by mouth 2 (two) times daily.   cetirizine (ZYRTEC) 10 MG tablet TAKE ONE TABLET BY MOUTH TWICE A DAY   cholecalciferol (VITAMIN D3) 25 MCG (1000 UNIT) tablet Take 1,000 Units by mouth daily.   fluticasone (FLONASE) 50 MCG/ACT nasal spray Place 1 spray into both nostrils daily.   hydrochlorothiazide (HYDRODIURIL) 25 MG tablet TAKE ONE TABLET BY MOUTH EVERY MORNING   levothyroxine (SYNTHROID) 150 MCG tablet Take 1 tablet (150 mcg total) by mouth daily.   magnesium oxide (MAG-OX) 400 MG tablet Take 400 mg by mouth daily.   pantoprazole (PROTONIX) 40 MG tablet Take 1 tablet (40 mg total) by mouth daily for 30 doses.   sertraline (ZOLOFT) 100 MG tablet TAKE 1.5 TABLETS BY MOUTH EVERY MORNING   No facility-administered encounter medications on file as of 01/04/2022.    Allergies (verified) Mango flavor [flavoring agent] and Oxycontin [oxycodone]   History: Past Medical History:  Diagnosis Date   Anemia    Anxiety    Arthritis    Asthma    Cancer ([place])    Depression    Dyspnea    GERD (gastroesophageal reflux disease)    Goiter    History of hiatal hernia  Hypertension    Past Surgical History:  Procedure Laterality Date   CHOLECYSTECTOMY     FOOT SURGERY     KNEE SURGERY Left    TKA x2    1974, 2014   SHOULDER SURGERY     THYROIDECTOMY N/A 07/05/2021   Procedure: TOTAL THYROIDECTOMY;  Surgeon: [name], [name], MD;  Location: [facility];  Service: General;  Laterality: N/A;   TOTAL HIP ARTHROPLASTY Left    TOTAL KNEE ARTHROPLASTY Right 11/28/2020   Procedure: TOTAL KNEE ARTHROPLASTY;  Surgeon: Paralee Cancel, MD;  Location: [facility];  Service: Orthopedics;  Laterality: Right;   XI ROBOTIC ASSISTED HIATAL HERNIA REPAIR N/A 08/20/2021   Procedure: XI ROBOTIC ASSISTED HIATAL HERNIA REPAIR WITH PARTIAL FUNDOPLICATION AND EGD;  Surgeon: [name], [name], MD;  Location: [facility];  Service: General;  Laterality: N/A;   Family History  Problem Relation Age of Onset   Heart disease Mother    Breast cancer Maternal Grandmother    Emphysema Paternal Grandmother        never smoked but chewed tobacco   Social History   Socioeconomic History   Marital status: Married    Spouse name: Not on file   Number of children: Not on file   Years of education: Not on file   Highest education level: Not on file  Occupational History   Not on file  Tobacco Use   Smoking status: Former    Packs/day: 1.00    Years: 4.00    Total pack years: 4.00    Types: Cigarettes    Quit date: 52    Years since quitting: 49.8   Smokeless tobacco: Never  Vaping Use   Vaping Use: Never used  Substance and Sexual Activity   Alcohol use: Yes    Comment: Wine occasional   Drug use: No   Sexual activity: Not on file  Other Topics Concern   Not on file  Social History Narrative   Not on file   Social Determinants of Health   Financial Resource Strain: Low Risk  (01/04/2022)   Overall Financial Resource Strain (CARDIA)    Difficulty of Paying Living Expenses: Not very hard  Food Insecurity: No Food Insecurity (01/04/2022)   Hunger Vital Sign    Worried About Running Out of Food in the Last Year: Never true    Ran Out of Food in the Last Year: Never true  Transportation Needs: No Transportation Needs (01/04/2022)   PRAPARE - Hydrologist (Medical): No    Lack of Transportation (Non-Medical): No  Physical Activity: Sufficiently Active (01/04/2022)   Exercise Vital Sign    Days of Exercise per Week: 3 days    Minutes of Exercise per Session: 60 min  Stress: No Stress Concern Present (01/04/2022)   [place]    Feeling of Stress : Not at all  Social Connections: Moderately Isolated (01/04/2022)   Social Connection and Isolation Panel [NHANES]    Frequency of  Communication with Friends and Family: More than three times a week    Frequency of Social Gatherings with Friends and Family: Three times a week    Attends Religious Services: Never    Active Member of Clubs or Organizations: No    Attends Archivist Meetings: Never    Marital Status: Married    Tobacco Counseling Counseling given: Not Answered   Clinical Intake:  Pre-visit preparation completed: No  Pain :  0-10 Pain Score: 5  Pain Type: Chronic pain Pain Location: Shoulder Pain Orientation: Left Pain Descriptors / Indicators: Aching, Sharp Pain Onset: More than a month ago Pain Frequency: Constant     Nutritional Risks: None Diabetes: No  How often do you need to have someone help you when you read instructions, pamphlets, or other written materials from your doctor or pharmacy?: (P) 2 - Rarely What is the last grade level you completed in school?: PhD  Diabetic?no  Interpreter Needed?: No  Information entered by :: Y.Jannette Cotham RMA   Activities of Daily Living    12/31/2021   10:04 AM 11/16/2021    8:43 AM  In your present state of health, do you have any difficulty performing the following activities:  Hearing? 0 0  Vision? 1 0  Difficulty concentrating or making decisions? 0 0  Walking or climbing stairs? 0 0  Dressing or bathing? 0 0  Doing errands, shopping? 0 0  Preparing Food and eating ? N   Using the Toilet? N   In the past six months, have you accidently leaked urine? N   Do you have problems with loss of bowel control? N   Managing your Medications? N   Managing your Finances? N   Housekeeping or managing your Housekeeping? N     Patient Care Team: Early, Coralee Pesa, NP as PCP - General (Nurse Practitioner) Buford Dresser, MD as PCP - Cardiology (Cardiology)  Indicate any recent Medical Services you may have received from other than [facility] providers in the past year (date may be approximate).     Assessment:   This is a  routine wellness examination for [place].  Hearing/Vision screen No results found.  Dietary issues and exercise activities discussed:     Goals Addressed   None   Depression Screen    01/04/2022   11:10 AM 11/16/2021    8:43 AM 01/23/2021   12:11 PM  PHQ 2/9 Scores  PHQ - 2 Score 0 0 0  PHQ- 9 Score 0 0 3    Fall Risk    12/31/2021   10:04 AM 11/16/2021    8:43 AM 01/23/2021   12:11 PM  Fall Risk   Falls in the past year? 0 0 1  Number falls in past yr: 0 0 0  Injury with Fall? 0 0 0  Risk for fall due to :  No Fall Risks Orthopedic patient  Follow up  Falls evaluation completed;Education provided Falls evaluation completed;Education provided    [place]:  Any stairs in or around the home? Yes  If so, are there any without handrails? Yes  Home free of loose throw rugs in walkways, pet beds, electrical cords, etc? Yes  Adequate lighting in your home to reduce risk of falls? Yes   ASSISTIVE DEVICES UTILIZED TO PREVENT FALLS:  Life alert? No  Use of a cane, walker or w/c? No  Grab bars in the bathroom? No  Shower chair or bench in shower? No  Elevated toilet seat or a handicapped toilet? No    Cognitive Function:        01/04/2022   11:17 AM  6CIT Screen  What Year? 0 points  What month? 0 points  What time? 0 points  Count back from 20 0 points  Months in reverse 0 points  Repeat phrase 0 points  Total Score 0 points    Immunizations Immunization History  Administered Date(s) Administered   Influenza,inj,Quad PF,6+  Mos 11/16/2021   Influenza-Unspecified 12/18/2020   Pfizer Covid-19 Vaccine Bivalent Booster 25yr & up 12/18/2020    TDAP status: Due, Education has been provided regarding the importance of this vaccine. Advised may receive this vaccine at local pharmacy or Health Dept. Aware to provide a copy of the vaccination record if obtained from local pharmacy or Health Dept. Verbalized acceptance and  understanding.  Flu Vaccine status: Up to date  Pneumococcal vaccine status: Due, Education has been provided regarding the importance of this vaccine. Advised may receive this vaccine at local pharmacy or Health Dept. Aware to provide a copy of the vaccination record if obtained from local pharmacy or Health Dept. Verbalized acceptance and understanding.  Covid-19 vaccine status: Information provided on how to obtain vaccines.   Qualifies for Shingles Vaccine? Yes   Zostavax completed No   Shingrix Completed?: No.    Education has been provided regarding the importance of this vaccine. Patient has been advised to call insurance company to determine out of pocket expense if they have not yet received this vaccine. Advised may also receive vaccine at local pharmacy or Health Dept. Verbalized acceptance and understanding.  Screening Tests Health Maintenance  Topic Date Due   COVID-19 Vaccine (2 - Pfizer risk series) 01/08/2021   Zoster Vaccines- Shingrix (1 of 2) 02/28/2022 (Originally 06/09/1974)   Pneumonia Vaccine 67 Years old (1 - PCV) 11/30/2022 (Originally 06/10/2019)   TETANUS/TDAP  11/30/2022 (Originally [birth year]09/1974)   Medicare Annual Wellness (AWV)  01/05/2023   MAMMOGRAM  01/30/2023   COLONOSCOPY (Pts 45-447yrInsurance coverage will need to be confirmed)  03/23/2031   INFLUENZA VACCINE  Completed   DEXA SCAN  Completed   HPV VACCINES  Aged Out   Hepatitis C Screening  Discontinued    Health Maintenance  Health Maintenance Due  Topic Date Due   COVID-19 Vaccine (2 - Pfizer risk series) 01/08/2021    Colorectal cancer screening: Type of screening: Colonoscopy. Completed 03/22/21. Repeat every 10 years  Mammogram status: Completed 01/29/21. Repeat every year is schedule for another one in January.  Bone Density status: Completed 02/03/20. Results reflect: Bone density results: OSTEOPOROSIS. Repeat every 2 years.  Lung Cancer Screening: (Low Dose CT Chest recommended if Age  67-80ears, 30 pack-year currently smoking OR have quit w/in 15years.) does not qualify.   Lung Cancer Screening Referral: n/a  Additional Screening:  Hepatitis C Screening: does qualify; Completed n/a  Vision Screening: Recommended annual ophthalmology exams for early detection of glaucoma and other disorders of the eye. Is the patient up to date with their annual eye exam?  Yes  Who is the provider or what is the name of the office in which the patient attends annual eye exams? [place] is not established with a provider, would they like to be referred to a provider to establish care? No .   Dental Screening: Recommended annual dental exams for proper oral hygiene  Community Resource Referral / Chronic Care Management: CRR required this visit?  No   CCM required this visit?  No      Plan:     I have personally reviewed and noted the following in the patient's chart:   Medical and social history Use of alcohol, tobacco or illicit drugs  Current medications and supplements including opioid prescriptions. Patient is not currently taking opioid prescriptions. Functional ability and status Nutritional status Physical activity Advanced directives List of other physicians Hospitalizations, surgeries, and ER visits in previous 12 months Vitals Screenings  to include cognitive, depression, and falls Referrals and appointments  In addition, I have reviewed and discussed with patient certain preventive protocols, quality metrics, and best practice recommendations. A written personalized care plan for preventive services as well as general preventive health recommendations were provided to patient.     [place], CMA   01/04/2022   Nurse Notes: Patient stated she has received a tdap and pneumonia vaccine at her previous doctor's office she was unable to provide dates and I did not see it on the registry.

## 2022-01-06 ENCOUNTER — Encounter (HOSPITAL_BASED_OUTPATIENT_CLINIC_OR_DEPARTMENT_OTHER): Payer: Self-pay | Admitting: Nurse Practitioner

## 2022-01-08 ENCOUNTER — Other Ambulatory Visit (HOSPITAL_BASED_OUTPATIENT_CLINIC_OR_DEPARTMENT_OTHER): Payer: Self-pay

## 2022-01-08 ENCOUNTER — Telehealth: Payer: Self-pay | Admitting: Primary Care

## 2022-01-08 DIAGNOSIS — F413 Other mixed anxiety disorders: Secondary | ICD-10-CM

## 2022-01-08 MED ORDER — SERTRALINE HCL 100 MG PO TABS: ORAL_TABLET | ORAL | 3 refills | Status: DC

## 2022-01-08 NOTE — Telephone Encounter (Signed)
Fax received from Dr. Esmond Plants with [facility] to perform a Left Shoulder Revision to TSA on patient.  Patient needs surgery clearance. Surgery is PENDING. Patient was seen on 09/12/2021. Office protocol is a risk assessment can be sent to surgeon if patient has been seen in 60 days or less.   Sending to Derl Barrow for risk assessment or recommendations if patient needs to be seen in office prior to surgical procedure.    Patient has office visit on 01/15/2022 with [facility] for surgical clearance!

## 2022-01-14 ENCOUNTER — Ambulatory Visit: Payer: Medicare PPO | Admitting: Internal Medicine

## 2022-01-14 ENCOUNTER — Encounter: Payer: Self-pay | Admitting: Internal Medicine

## 2022-01-14 VITALS — BP 116/74 | HR 76 | Ht 64.0 in | Wt 206.0 lb

## 2022-01-14 DIAGNOSIS — E890 Postprocedural hypothyroidism: Secondary | ICD-10-CM

## 2022-01-14 DIAGNOSIS — C73 Malignant neoplasm of thyroid gland: Secondary | ICD-10-CM | POA: Diagnosis not present

## 2022-01-14 NOTE — Progress Notes (Unsigned)
Name: [name]  MRN/ DOB: [id number], January 28, 1956    Age/ Sex: 66 y.o., female    PCP: Orma Render, NP   Reason for Endocrinology Evaluation: MNG     Date of Initial Endocrinology Evaluation: 01/14/2022     HPI: [name] is a 66 y.o. female with a past medical history of Asthma, MNG. The patient presented for initial endocrinology clinic visit on 01/14/2022 for consultative assistance with her MNG.    During evaluation for chronic anemia she was seen by GI and was noted to have a hiatal hernia.  A CT scan was ordered for further evaluation of hiatal hernia and the patient had an incidental 2.2 cm left thyroid nodule, which prompted a thyroid ultrasound showing multinodular goiter.  Per patient she has been diagnosed with multinodular goiter in 2002 She was on LT-for replacement for many years before this was stopped by a physician    She is S/P FNA of the right mid 4.5 cm nodule with a cytology report consistent with suspicious for malignancy ( Bethesda Category V) . She is also S/P benign FNA of the left 2.9 cm nodule in  06/2021    Mother with thyroid disease   SUBJECTIVE:   Today (01/14/22):  [name] is here for a follow up on PTC.  She is S/P total thyroidectomy 07/05/2021  Pending left shoulder sx  Denies constipation or diarrhea  Denies palpitations  Denies tremors  Denies hoarseness  No biotin    Levothyroxine 150 mg   HISTORY:  Past Medical History:  Past Medical History:  Diagnosis Date   Anemia    Anxiety    Arthritis    Asthma    Cancer ([place])    Depression    Dyspnea    GERD (gastroesophageal reflux disease)    Goiter    History of hiatal hernia    Hypertension    Past Surgical History:  Past Surgical History:  Procedure Laterality Date   CHOLECYSTECTOMY     FOOT SURGERY     KNEE SURGERY Left    TKA x2    1974, 2014   SHOULDER SURGERY     THYROIDECTOMY N/A 07/05/2021   Procedure: TOTAL THYROIDECTOMY;  Surgeon:  [name], [name], MD;  Location: [facility];  Service: General;  Laterality: N/A;   TOTAL HIP ARTHROPLASTY Left    TOTAL KNEE ARTHROPLASTY Right 11/28/2020   Procedure: TOTAL KNEE ARTHROPLASTY;  Surgeon: Paralee Cancel, MD;  Location: [facility];  Service: Orthopedics;  Laterality: Right;   XI ROBOTIC ASSISTED HIATAL HERNIA REPAIR N/A 08/20/2021   Procedure: XI ROBOTIC ASSISTED HIATAL HERNIA REPAIR WITH PARTIAL FUNDOPLICATION AND EGD;  Surgeon: [name], [name], MD;  Location: [facility];  Service: General;  Laterality: N/A;    Social History:  reports that she quit smoking about 49 years ago. Her smoking use included cigarettes. She has a 4.00 pack-year smoking history. She has never used smokeless tobacco. She reports current alcohol use. She reports that she does not use drugs. Family History: family history includes Breast cancer in her maternal grandmother; Emphysema in her paternal grandmother; Heart disease in her mother.   HOME MEDICATIONS: Allergies as of 01/14/2022       Reactions   Mango Flavor [flavoring Agent] Anaphylaxis   Specifically mango peel , patient can eat the fruit itself   Oxycontin [oxycodone]    Dizziness        Medication List  Accurate as of January 14, 2022 12:04 PM. If you have any questions, ask your nurse or doctor.          albuterol 108 (90 Base) MCG/ACT inhaler Commonly known as: VENTOLIN HFA Inhale 2 puffs into the lungs every 6 (six) hours as needed for wheezing or shortness of breath.   ALPRAZolam 0.25 MG tablet Commonly known as: XANAX Take 1/2 tab to 1 tab as needed for panic/anxiety up to two times in a day.   Breztri Aerosphere 160-9-4.8 MCG/ACT Aero Generic drug: Budeson-Glycopyrrol-Formoterol Inhale 2 puffs into the lungs in the morning and at bedtime.   busPIRone 7.5 MG tablet Commonly known as: BUSPAR Take 1 tablet (7.5 mg total) by mouth 2 (two) times daily.   cetirizine 10 MG tablet Commonly known as: ZYRTEC TAKE  ONE TABLET BY MOUTH TWICE A DAY   cholecalciferol 25 MCG (1000 UNIT) tablet Commonly known as: VITAMIN D3 Take 1,000 Units by mouth daily.   fluticasone 50 MCG/ACT nasal spray Commonly known as: FLONASE Place 1 spray into both nostrils daily.   hydrochlorothiazide 25 MG tablet Commonly known as: HYDRODIURIL TAKE ONE TABLET BY MOUTH EVERY MORNING   levothyroxine 150 MCG tablet Commonly known as: SYNTHROID Take 1 tablet (150 mcg total) by mouth daily.   magnesium oxide 400 MG tablet Commonly known as: MAG-OX Take 400 mg by mouth daily.   pantoprazole 40 MG tablet Commonly known as: PROTONIX Take 1 tablet (40 mg total) by mouth daily for 30 doses.   sertraline 100 MG tablet Commonly known as: ZOLOFT TAKE 1.5 TABLETS BY MOUTH EVERY MORNING   SOLARCAINE EX Apply 1 application. topically daily as needed (bug bites).   vitamin C 1000 MG tablet Take 1,000 mg by mouth daily.          REVIEW OF SYSTEMS: A comprehensive ROS was conducted with the patient and is negative except as per HPI    OBJECTIVE:  VS:BP 116/74 (BP Location: Left Arm, Patient Position: Sitting, Cuff Size: Large)   Pulse 76   Ht _0  (1.626 m)   Wt 206 lb (93.4 kg)   LMP 08/08/2010   SpO2 94%   BMI 35.36 kg/m    Wt Readings from Last 3 Encounters:  11/16/21 200 lb (90.7 kg)  09/12/21 198 lb (89.8 kg)  09/12/21 197 lb (89.4 kg)     EXAM: General: Pt appears well and is in NAD  Neck: General: Supple without adenopathy. Thyroid: no nodules appreciated   Lungs: Clear with good BS bilat with no rales, rhonchi, or wheezes  Heart: Auscultation: RRR.  Abdomen: Normoactive bowel sounds, soft, nontender, without masses or organomegaly palpable  Extremities:  BL LE: No pretibial edema normal ROM and strength.  Mental Status: Judgment, insight: Intact Orientation: Oriented to time, place, and person Mood and affect: No depression, anxiety, or agitation     DATA REVIEWED:   Latest Reference  Range & Units 01/14/22 15:28  TSH 0.35 - 5.50 uIU/mL 5.55 (H)    Tg- pending  Tg Ab - pending     SURGICAL PATHOLOGY 07/05/221  A. THYROID, TOTAL THYROIDECTOMY:  Papillary carcinoma, oncocytic variant, of right lobe (see comment)  Tumor measures 3.8 x 3.1 x 2.1 cm  Margins free  Background of multinodular goiter with dominant nodule within the left  lobe   Comment:  Sections of the right thyroid show a lobulated circumscribed thinly  encapsulated nodule composed of variably sized follicular groups showing  follicular cells with abundant granular  cytoplasm all within a fibrotic  focally hyalinized fibrovascular stroma.  These cells show a variable  but moderate nuclear enlargement and cytologic atypia.  Many of the  cells show enlarged nuclei with clearing and margination of the nuclear  chromatin and scattered small inconspicuous nucleoli.  Although no  overlap is seen, the cells show frequent nuclear grooves, and nuclear  pseudoinclusions are readily identified.  There is scant associated  colloid.  Scattered calcifications and psammoma bodies are seen.  These  findings are consistent with a papillary carcinoma with oncocytic  features.   ONCOLOGY TABLE   THYROID GLAND, CARCINOMA: Resection   Procedure: Total thyroidectomy  Tumor Focality: Unifocal  Tumor Site: Right lobe  Tumor Size: 3.8 x 3.1 x 2.1 cm  Histologic Type: Papillary carcinoma, oncocytic variant  Angioinvasion: Not identified  Lymphatic Invasion: Not identified  Extrathyroidal Extension: Not identified  Margin Status: All margins negative for invasive carcinoma  Regional Lymph Node Status: Not applicable (no regional lymph nodes  submitted or found)  Distant Metastasis: Not applicable  Pathologic Stage Classification (pTNM, AJCC 8th Edition): pT2, pN n/a    Old records , labs and images have been reviewed.     ASSESSMENT/PLAN/RECOMMENDATIONS:   Papillary thyroid carcinoma:   -Patient is status  post total thyroidectomy on Jul 05, 2021, oncocytic variant, unifocal, 3.8 cm at max diameter, angioinvasion not identified, NO lymph nodes where submitted -Given oncocytic variant and in review of the literature there has been significant controversy about the biological behavior and optimal management of oncocytic- PTC variant but overall they have similar prognosis to non- oncocytic PTC.  - RAI ablation use in the setting of oncocytic PTC is variable due to the suggestion that there is limited uptake of RAI and therefore limited benefit -Given the above information and tumor size <4 centimeter there is no indication for our AI at this time -TSH goal 0.5-2 u IU/mL -We will proceed with TG, TG AB -We proceed with thyroid bed ultrasound    2.  Postoperative hypothyroidism  -TSH has increased, will increase levothyroxine as below -- Pt educated extensively on the correct way to take levothyroxine (first thing in the morning with water, 30 minutes before eating or taking other medications). - Pt encouraged to double dose the following day if she were to miss a dose given long half-life of levothyroxine. -We will adjust levothyroxine dose as below   Medication  Stop levothyroxine 150 mcg daily Start levothyroxine 175 mcg daily   F/U in 6 months  Labs in 8 weeks  Signed electronically by: Mack Guise, MD  [facility]  [facility] [address] [place], [place] [address] Phone: [phone] FAX: [phone]   CC: Orma Render, NP [address] [place] [address] Phone: [phone] Fax: [phone]   Return to Endocrinology clinic as below: Future Appointments  Date Time Provider [place]  01/14/2022  3:00 PM Loranda Mastel, Melanie Crazier, MD [facility] None  01/15/2022  8:30 AM Martyn Ehrich, NP [facility] None  03/05/2022  3:50 PM [facility] MM 2 [facility]

## 2022-01-14 NOTE — Patient Instructions (Signed)

## 2022-01-14 NOTE — Progress Notes (Unsigned)
_0  ID: [name], female    DOB: Jul 13, 1955, 66 y.o.   MRN: [id number]  No chief complaint on file.   Referring provider: Orma Render, NP  HPI: 66 year old female, former light smoker quit 1974 (4 pack year hx).  Past medical history significant for moderate persistent asthma, thyroid cancer that is post thyroidectomy, hiatal hernia.  Patient of Dr. Verlee Monte, last seen in office on 09/12/2021.    01/15/2022 Patient presents today for surgical clearance. Patient follows with our office for history of asthma. Planning for left shoulder revision with Dr. Netta Cedars with [facility], date to be determined. She is maintained on Breztri, Flonase and Zyrtec.           Eos 200-400   IgE 1000   Chest Imaging: CT Chest 04/06/21 reviewed by me remarkable for large hiatal hernia, a little scar in RML, lingula, bases   CXR 06/26/21 stable relative to prior   CTA Coronaries 07/03/21 reviewed by me lung bases not significantly changed   Allergies  Allergen Reactions   Mango Flavor [Flavoring Agent] Anaphylaxis    Specifically mango peel , patient can eat the fruit itself   Oxycontin [Oxycodone]     Dizziness    Immunization History  Administered Date(s) Administered   Influenza,inj,Quad PF,6+ Mos 11/16/2021   Influenza-Unspecified 12/18/2020   Pfizer Covid-19 Vaccine Bivalent Booster 71yr & up 12/18/2020    Past Medical History:  Diagnosis Date   Anemia    Anxiety    Arthritis    Asthma    Cancer ([facility]    Depression    Dyspnea    GERD (gastroesophageal reflux disease)    Goiter    History of hiatal hernia    Hypertension     Tobacco History: Social History   Tobacco Use  Smoking Status Former   Packs/day: 1.00   Years: 4.00   Total pack years: 4.00   Types: Cigarettes   Quit date: 1974   Years since quitting: 49.8  Smokeless Tobacco Never   Counseling given: Not Answered   Outpatient Medications Prior to Visit  Medication Sig Dispense  Refill   albuterol (VENTOLIN HFA) 108 (90 Base) MCG/ACT inhaler Inhale 2 puffs into the lungs every 6 (six) hours as needed for wheezing or shortness of breath. 1 each 3   ALPRAZolam (XANAX) 0.25 MG tablet Take 1/2 tab to 1 tab as needed for panic/anxiety up to two times in a day. 20 tablet 2   Ascorbic Acid (VITAMIN C) 1000 MG tablet Take 1,000 mg by mouth daily.     Benzocaine (SOLARCAINE EX) Apply 1 application. topically daily as needed (bug bites).     Budeson-Glycopyrrol-Formoterol (BREZTRI AEROSPHERE) 160-9-4.8 MCG/ACT AERO Inhale 2 puffs into the lungs in the morning and at bedtime. 10.7 g 11   busPIRone (BUSPAR) 7.5 MG tablet Take 1 tablet (7.5 mg total) by mouth 2 (two) times daily. 60 tablet 3   cetirizine (ZYRTEC) 10 MG tablet TAKE ONE TABLET BY MOUTH TWICE A DAY 60 tablet 3   cholecalciferol (VITAMIN D3) 25 MCG (1000 UNIT) tablet Take 1,000 Units by mouth daily.     fluticasone (FLONASE) 50 MCG/ACT nasal spray Place 1 spray into both nostrils daily. 16 g 6   hydrochlorothiazide (HYDRODIURIL) 25 MG tablet TAKE ONE TABLET BY MOUTH EVERY MORNING 90 tablet 1   levothyroxine (SYNTHROID) 150 MCG tablet Take 1 tablet (150 mcg total) by mouth daily. 90 tablet 3   magnesium oxide (MAG-OX) 400 MG  tablet Take 400 mg by mouth daily.     sertraline (ZOLOFT) 100 MG tablet TAKE 1.5 TABLETS BY MOUTH EVERY MORNING 45 tablet 3   No facility-administered medications prior to visit.      Review of Systems  Review of Systems   Physical Exam  LMP 08/08/2010  Physical Exam   Lab Results:  CBC    Component Value Date/Time   WBC 8.5 10/26/2021 1419   WBC 12.1 (H) 08/20/2021 1705   RBC 4.56 10/26/2021 1419   RBC 4.47 08/20/2021 1705   HGB 13.4 10/26/2021 1419   HCT 40.2 10/26/2021 1419   PLT 215 10/26/2021 1419   MCV 88 10/26/2021 1419   MCH 29.4 10/26/2021 1419   MCH 29.8 08/20/2021 1705   MCHC 33.3 10/26/2021 1419   MCHC 32.2 08/20/2021 1705   RDW 13.5 10/26/2021 1419   LYMPHSABS  1.2 10/26/2021 1419   MONOABS 0.8 04/26/2021 1601   EOSABS 0.3 10/26/2021 1419   BASOSABS 0.1 10/26/2021 1419    BMET    Component Value Date/Time   NA 140 08/08/2021 1022   NA 140 06/21/2021 0934   K 4.8 08/08/2021 1022   CL 106 08/08/2021 1022   CO2 27 08/08/2021 1022   GLUCOSE 91 08/08/2021 1022   BUN 20 08/08/2021 1022   BUN 23 06/21/2021 0934   CREATININE 0.93 08/20/2021 1705   CALCIUM 9.2 08/08/2021 1022   GFRNONAA >60 08/20/2021 1705   GFRAA  07/22/2010 0810    >60        The eGFR has been calculated using the MDRD equation. This calculation has not been validated in all clinical situations. eGFR's persistently <60 mL/min signify possible Chronic Kidney Disease.    BNP No results found for: "BNP"  ProBNP    Component Value Date/Time   PROBNP 64.0 04/26/2021 1101    Imaging: CT SHOULDER LEFT WO CONTRAST  Result Date: 12/24/2021 CLINICAL DATA:  Left shoulder pain for 8 months. History of hemiarthroplasty. EXAM: CT OF THE UPPER LEFT EXTREMITY WITHOUT CONTRAST TECHNIQUE: Multidetector CT imaging of the upper left extremity was performed according to the standard protocol. RADIATION DOSE REDUCTION: This exam was performed according to the departmental dose-optimization program which includes automated exposure control, adjustment of the mA and/or kV according to patient size and/or use of iterative reconstruction technique. COMPARISON:  None Available. FINDINGS: Bones/Joint/Cartilage Left shoulder hemiarthroplasty. Articular surface irregularity involving the glenoid. No hardware failure or complication. No acute fracture or dislocation. Normal alignment. No joint effusion. No periarticular fluid collection. Ligaments Ligaments are suboptimally evaluated by CT. Muscles and Tendons Muscles are normal. No muscle atrophy. No intramuscular fluid collection or hematoma. Soft tissue No fluid collection or hematoma. No soft tissue mass. Visualized left lung is clear.  IMPRESSION: 1. Left shoulder hemiarthroplasty without hardware failure or complication. Articular surface irregularity involving the glenoid. Electronically Signed   By: Kathreen Devoid M.D.   On: 12/24/2021 11:35     Assessment & Plan:   No problem-specific Assessment & Plan notes found for this encounter.     Martyn Ehrich, NP 01/14/2022

## 2022-01-15 ENCOUNTER — Ambulatory Visit: Payer: Medicare PPO | Admitting: Primary Care

## 2022-01-15 ENCOUNTER — Encounter: Payer: Self-pay | Admitting: Primary Care

## 2022-01-15 VITALS — BP 128/78 | HR 56 | Temp 97.9°F | Ht 64.0 in | Wt 203.8 lb

## 2022-01-15 DIAGNOSIS — Z01811 Encounter for preprocedural respiratory examination: Secondary | ICD-10-CM

## 2022-01-15 DIAGNOSIS — J4540 Moderate persistent asthma, uncomplicated: Secondary | ICD-10-CM

## 2022-01-15 DIAGNOSIS — J45909 Unspecified asthma, uncomplicated: Secondary | ICD-10-CM | POA: Insufficient documentation

## 2022-01-15 LAB — THYROGLOBULIN ANTIBODY: Thyroglobulin Ab: <1 IU/mL (ref <=1)

## 2022-01-15 LAB — TSH: TSH: 5.55 u[IU]/mL — ABNORMAL HIGH (ref 0.35–5.50)

## 2022-01-15 LAB — THYROGLOBULIN LEVEL: Thyroglobulin: 0.2 ng/mL — ABNORMAL LOW

## 2022-01-15 MED ORDER — LEVOTHYROXINE SODIUM 175 MCG PO TABS: 175.0000 ug | ORAL_TABLET | Freq: Every day | ORAL | 3 refills | Status: DC

## 2022-01-15 NOTE — Telephone Encounter (Signed)
OV notes and clearance form have been faxed back to [facility]. Nothing further needed at this time. ?

## 2022-01-15 NOTE — Assessment & Plan Note (Signed)
-   Patient is considered low risk for prolonged mechanical ventilation and/or post pulmonary complications. Asthma is well controlled on ICS/LABA/LAMA. No recent respiratory infections. Exam was benign. Pulmonary function testing in March 2023 showed mild obstructive lung disease. She is not on oxygen. Ultimate clearance will be decided upon by surgeon.   Major Pulmonary risks identified in the multifactorial risk analysis are but not limited to a) pneumonia; b) recurrent intubation risk; c) prolonged or recurrent acute respiratory failure needing mechanical ventilation; d) prolonged hospitalization; e) DVT/Pulmonary embolism; f) Acute Pulmonary edema  Recommend 1. Short duration of surgery as much as possible and avoid paralytic if possible 2. Outpatient surgery ok. Recovery in step down or ICU with Pulmonary consultation only if needed  3. DVT prophylaxis 4. Aggressive pulmonary toilet with o2, bronchodilatation, and incentive spirometry and early ambulation

## 2022-01-15 NOTE — Assessment & Plan Note (Addendum)
-   Stable; No recent recent exacerbations. ACT score 20. Daily Albuterol use with seasonal allergies.  - Continue Breztri Aerosphere two puffs twice daily and prn Albuterol 2 puffs every 4-6 hours for breakthrough sob/wheezing - Follow up in 6 months or sooner

## 2022-01-15 NOTE — Patient Instructions (Signed)
Low risk for prolonged mechanical ventilation and/or post op complications including respiratory failure, aspiration/pneumonia, pulmonary embolism   Recommend 1. Short duration of surgery as much as possible and avoid paralytic if possible 2. DVT prophylaxis 3. Aggressive pulmonary toilet with o2, bronchodilatation, and incentive spirometry and early ambulation  Recommendations: Continue Breztri Aerosphere- take two puffs morning and evening Use Albuterol 2 puffs every 4-6 hours for breakthrough shortness of breath or wheezing Continue Flonase and Zyrtec Call if you develop worsening shortness of breath, wheezing, purulent mucus or fevers   Follow-up: 6 months with Dr. Verlee Monte

## 2022-01-16 ENCOUNTER — Ambulatory Visit
Admission: RE | Admit: 2022-01-16 | Discharge: 2022-01-16 | Disposition: A | Discharge status: Home / Self Care | Payer: Medicare PPO | Source: Ambulatory Visit | Attending: Internal Medicine | Admitting: Internal Medicine

## 2022-01-16 DIAGNOSIS — Z85850 Personal history of malignant neoplasm of thyroid: Secondary | ICD-10-CM | POA: Diagnosis not present

## 2022-01-16 DIAGNOSIS — C73 Malignant neoplasm of thyroid gland: Secondary | ICD-10-CM

## 2022-01-16 DIAGNOSIS — E890 Postprocedural hypothyroidism: Secondary | ICD-10-CM | POA: Diagnosis not present

## 2022-01-17 DIAGNOSIS — M25551 Pain in right hip: Secondary | ICD-10-CM | POA: Diagnosis not present

## 2022-01-21 NOTE — H&P (Signed)
Patient's anticipated LOS is less than 2 midnights, meeting these requirements: - Younger than 28 - Lives within 1 hour of care - Has a competent adult at home to recover with post-op recover - NO history of  - Chronic pain requiring opiods  - Diabetes  - Coronary Artery Disease  - Heart failure  - Heart attack  - Stroke  - DVT/VTE  - Cardiac arrhythmia  - Respiratory Failure/COPD  - Renal failure  - Anemia  - Advanced Liver disease     [name] is an 66 y.o. female.    Chief Complaint: left shoulder pain  HPI: Pt is a 66 y.o. female complaining of left shoulder pain for multiple years. Pain had continually increased since the beginning. X-rays in the clinic show previous hemiarthroplasty. Various options are discussed with the patient. Risks, benefits and expectations were discussed with the patient. Patient understand the risks, benefits and expectations and wishes to proceed with surgery.   PCP:  Orma Render, NP  D/C Plans: Home  PMH: Past Medical History:  Diagnosis Date   Anemia    Anxiety    Arthritis    Asthma    Cancer ([place])    Depression    Dyspnea    GERD (gastroesophageal reflux disease)    Goiter    History of hiatal hernia    Hypertension     PSH: Past Surgical History:  Procedure Laterality Date   CHOLECYSTECTOMY     FOOT SURGERY     KNEE SURGERY Left    TKA x2    1974, 2014   SHOULDER SURGERY     THYROIDECTOMY N/A 07/05/2021   Procedure: TOTAL THYROIDECTOMY;  Surgeon: Kieth Brightly [name], MD;  Location: [facility];  Service: General;  Laterality: N/A;   TOTAL HIP ARTHROPLASTY Left    TOTAL KNEE ARTHROPLASTY Right 11/28/2020   Procedure: TOTAL KNEE ARTHROPLASTY;  Surgeon: Paralee Cancel, MD;  Location: [facility];  Service: Orthopedics;  Laterality: Right;   XI ROBOTIC ASSISTED HIATAL HERNIA REPAIR N/A 08/20/2021   Procedure: XI ROBOTIC ASSISTED HIATAL HERNIA REPAIR WITH PARTIAL FUNDOPLICATION AND EGD;  Surgeon: [name], [name], MD;   Location: [facility];  Service: General;  Laterality: N/A;    Social History:  reports that she quit smoking about 49 years ago. Her smoking use included cigarettes. She has a 4.00 pack-year smoking history. She has never used smokeless tobacco. She reports current alcohol use. She reports that she does not use drugs. BMI: Estimated body mass index is 34.98 kg/m as calculated from the following:   Height as of 01/15/22: '5\' 4"'$  (1.626 m).   Weight as of 01/15/22: 92.4 kg.  No results found for: "ALBUMIN" Diabetes: Patient does not have a diagnosis of diabetes.     Smoking Status:      Allergies:  Allergies  Allergen Reactions   Mango Flavor [Flavoring Agent] Anaphylaxis    Specifically mango peel , patient can eat the fruit itself   Oxycontin [Oxycodone]     Dizziness    Medications: No current facility-administered medications for this encounter.   Current Outpatient Medications  Medication Sig Dispense Refill   albuterol (VENTOLIN HFA) 108 (90 Base) MCG/ACT inhaler Inhale 2 puffs into the lungs every 6 (six) hours as needed for wheezing or shortness of breath. 1 each 3   ALPRAZolam (XANAX) 0.25 MG tablet Take 1/2 tab to 1 tab as needed for panic/anxiety up to two times in a day. 20 tablet 2  Ascorbic Acid (VITAMIN C) 1000 MG tablet Take 1,000 mg by mouth daily.     Benzocaine (SOLARCAINE EX) Apply 1 application. topically daily as needed (bug bites).     Budeson-Glycopyrrol-Formoterol (BREZTRI AEROSPHERE) 160-9-4.8 MCG/ACT AERO Inhale 2 puffs into the lungs in the morning and at bedtime. 10.7 g 11   busPIRone (BUSPAR) 7.5 MG tablet Take 1 tablet (7.5 mg total) by mouth 2 (two) times daily. 60 tablet 3   cetirizine (ZYRTEC) 10 MG tablet TAKE ONE TABLET BY MOUTH TWICE A DAY 60 tablet 3   cholecalciferol (VITAMIN D3) 25 MCG (1000 UNIT) tablet Take 1,000 Units by mouth daily.     fluticasone (FLONASE) 50 MCG/ACT nasal spray Place 1 spray into both nostrils daily. 16 g 6    hydrochlorothiazide (HYDRODIURIL) 25 MG tablet TAKE ONE TABLET BY MOUTH EVERY MORNING 90 tablet 1   levothyroxine (SYNTHROID) 175 MCG tablet Take 1 tablet (175 mcg total) by mouth daily. 90 tablet 3   magnesium oxide (MAG-OX) 400 MG tablet Take 400 mg by mouth daily.     sertraline (ZOLOFT) 100 MG tablet TAKE 1.5 TABLETS BY MOUTH EVERY MORNING 45 tablet 3    No results found for this or any previous visit (from the past 48 hour(s)). No results found.  ROS: Pain with rom of the left upper extremity  Physical Exam: Alert and oriented 66 y.o. female in no acute distress Cranial nerves 2-12 intact Cervical spine: full rom with no tenderness, nv intact distally Chest: active breath sounds bilaterally, no wheeze rhonchi or rales Heart: regular rate and rhythm, no murmur Abd: non tender non distended with active bowel sounds Hip is stable with rom  Left shoulder painful rom  Strength 4.5/5 with ER and IR No rashes or edema distally  Assessment/Plan Assessment: left shoulder painful hemiarthroplasty  Plan:  Patient will undergo a revision left shoulder to total shoulder by Dr. Veverly Fells at [facility] Risks benefits and expectations were discussed with the patient. Patient understand risks, benefits and expectations and wishes to proceed. Preoperative templating of the joint replacement has been completed, documented, and submitted to the Operating Room personnel in order to optimize intra-operative equipment management.   Merla Riches PA-C, MPAS [facility] is now [employer] [address], [place], [place], [place] [address] Phone: [phone] [email or web address] Facebook  [employer]

## 2022-01-29 NOTE — Progress Notes (Signed)
COVID Vaccine received:  _0  No _1  Yes Date of any COVID positive Test in last 90 days: None  PCP - Jacolyn Reedy, NP Cardiologist - Dr. Buford Dresser Pulmonology- Dr. Daryl Eastern, NP - clearance in 01-15-22 epic notes  Chest x-ray - 06-26-21 EKG - 06-21-2021  Stress Test -  ECHO - 06-29-2021 Cardiac Cath -  Cardiac CT- 07-02-2021  PCR screen: _2  Ordered & Completed                      _3   No Order but Needs PROFEND                      _4   N/A for this surgery  Surgery Plan:  _5  Ambulatory                            _6  Outpatient in bed                            _7  Admit  Anesthesia:    _8  General  _9  Spinal                           _10   Choice _11   MAC  Pacemaker / ICD device _12  No _13  Yes        Device order form faxed _14  No    _15   Yes      Faxed to:  History of Sleep Apnea? _16  No _17  Yes   CPAP used?- _18  No _19  Yes    Does the patient monitor blood sugar? _20  No _21  Yes  _22  N/A  Blood Thinner / Instructions: none Aspirin Instructions: None  ERAS Protocol Ordered: _23  No  _24  Yes PRE-SURGERY _25  ENSURE  _26  G2  Patient is to be NPO after: 11:15 am  Comments:  patient will bring her Inhalers with her the DOS.   Activity level: Patient can climb a flight of stairs without difficulty; _27  No CP  _28  No SOB.  Patient can perform ADLs without assistance.   Anesthesia review: HTN, SOB, Asthma, anemia, anxiety  Patient denies shortness of breath, fever, cough and chest pain at PAT appointment.  Patient verbalized understanding and agreement to the Pre-Surgical Instructions that were given to them at this PAT appointment. Patient was also educated of the need to review these PAT instructions again prior to his/her surgery.I reviewed the appropriate phone numbers to call if they have any and questions or concerns.

## 2022-01-29 NOTE — Patient Instructions (Signed)
SURGICAL WAITING ROOM VISITATION Patients having surgery or a procedure may have no more than 2 support people in the waiting area - these visitors may rotate in the visitor waiting room.   Children under the age of 60 must have an adult with them who is not the patient. If the patient needs to stay at the hospital during part of their recovery, the visitor guidelines for inpatient rooms apply.  PRE-OP VISITATION  Pre-op nurse will coordinate an appropriate time for 1 support person to accompany the patient in pre-op.  This support person may not rotate.  This visitor will be contacted when the time is appropriate for the visitor to come back in the pre-op area.  Please refer to the [facility] website for the visitor guidelines for Inpatients (after your surgery is over and you are in a regular room).  You are not required to quarantine at this time prior to your surgery. However, you must do this: Hand Hygiene often Do NOT share personal items Notify your provider if you are in close contact with someone who has COVID or you develop fever 100.4 or greater, new onset of sneezing, cough, sore throat, shortness of breath or body aches.  If you test positive for Covid or have been in contact with anyone that has tested positive in the last 10 days please notify you surgeon.    Your procedure is scheduled on: Friday  February 08, 2022   Report to [facility] Main Entrance: [place] entrance where the [employer] is available.   Report to admitting at:  11:45   AM  +++++Call this number if you have any questions or problems the morning of surgery [phone]  Do not eat food after Midnight the night prior to your surgery/procedure.  After Midnight you may have the following liquids until  11:15  AM  DAY OF SURGERY  Clear Liquid Diet Water Black Coffee (sugar ok, NO MILK/CREAM OR CREAMERS)  Tea (sugar ok, NO MILK/CREAM OR CREAMERS) regular and decaf                              Plain Jell-O  with no fruit (NO RED)                                           Fruit ices (not with fruit pulp, NO RED)                                     Popsicles (NO RED)                                                                  Juice: apple, WHITE grape, WHITE cranberry Sports drinks like Gatorade or Powerade (NO RED)                    The day of surgery:  Drink ONE (1) Pre-Surgery Clear Ensure at 11;15 AM the morning of surgery. Drink in one sitting. Do not sip.  This drink was  given to you during your hospital pre-op appointment visit. Nothing else to drink after completing the Pre-Surgery Clear Ensure : No candy, chewing gum or throat lozenges.    FOLLOW ANY ADDITIONAL PRE OP INSTRUCTIONS YOU RECEIVED FROM YOUR SURGEON'S OFFICE!!!   Oral Hygiene is also important to reduce your risk of infection.        Remember - BRUSH YOUR TEETH THE MORNING OF SURGERY WITH YOUR REGULAR TOOTHPASTE  Take ONLY these medicines the morning of surgery with A SIP OF WATER: Buspirone (Buspar), sertraline (Zoloft), fluticasone (Flonase), Levothyroxine (Synthroid). If needed, you may take Alprazolam (Xanax) and you may use your Albuterol Inhaler.                  You may not have any metal on your body including hair pins, jewelry, and body piercing  Do not wear make-up, lotions, powders, perfumes  or deodorant  Do not wear nail polish including gel and S&S, artificial / acrylic nails, or any other type of covering on natural nails including finger and toenails. If you have artificial nails, gel coating, etc., that needs to be removed by a nail salon, Please have this removed prior to surgery. Not doing so may mean that your surgery could be cancelled or delayed if the Surgeon or anesthesia staff feels like they are unable to monitor you safely.   Do not shave 48 hours prior to surgery to avoid nicks in your skin which may contribute to postoperative infections.   Contacts, Hearing Aids,  dentures or bridgework may not be worn into surgery.   You may bring a small overnight bag with you on the day of surgery, only pack items that are not valuable .[facility] IS NOT RESPONSIBLE   FOR VALUABLES THAT ARE LOST OR STOLEN.   Do not bring your home medications to the hospital, Except for your ALBUTEROL INHALER. The Pharmacy will dispense medications listed on your medication list to you during your admission in the Hospital.  Special Instructions: Bring a copy of your healthcare power of attorney and living will documents the day of surgery, if you wish to have them scanned into your [facility] Records- EPIC  Please read over the following fact sheets you were given: IF YOU HAVE QUESTIONS ABOUT YOUR PRE-OP INSTRUCTIONS, PLEASE CALL [phone]  ([place])   [facility] - Preparing for Surgery Before surgery, you can play an important role.  Because skin is not sterile, your skin needs to be as free of germs as possible.  You can reduce the number of germs on your skin by washing with CHG (chlorahexidine gluconate) soap before surgery.  CHG is an antiseptic cleaner which kills germs and bonds with the skin to continue killing germs even after washing. Please DO NOT use if you have an allergy to CHG or antibacterial soaps.  If your skin becomes reddened/irritated stop using the CHG and inform your nurse when you arrive at Short Stay. Do not shave (including legs and underarms) for at least 48 hours prior to the first CHG shower.  You may shave your face/neck.  Please follow these instructions carefully:  1.  Shower with CHG Soap the night before surgery and the  morning of surgery.  2.  If you choose to wash your hair, wash your hair first as usual with your normal  shampoo.  3.  After you shampoo, rinse your hair and body thoroughly to remove the shampoo.  4.  Use CHG as you would any other liquid soap.  You can apply chg directly to the skin and wash.   Gently with a scrungie or clean washcloth.  5.  Apply the CHG Soap to your body ONLY FROM THE NECK DOWN.   Do not use on face/ open                           Wound or open sores. Avoid contact with eyes, ears mouth and genitals (private parts).                       Wash face,  Genitals (private parts) with your normal soap.             6.  Wash thoroughly, paying special attention to the area where your  surgery  will be performed.  7.  Thoroughly rinse your body with warm water from the neck down.  8.  DO NOT shower/wash with your normal soap after using and rinsing off the CHG Soap.            9.  Pat yourself dry with a clean towel.            10.  Wear clean pajamas.            11.  Place clean sheets on your bed the night of your first shower and do not  sleep with pets.  ON THE DAY OF SURGERY : Do not apply any lotions/deodorants the morning of surgery.  Please wear clean clothes to the hospital/surgery center.   Preparing for Total Shoulder Arthroplasty ================================================================= Please follow these instructions carefully, in addition to any other special Bathing information that was explained to you at the Presurgical Appointment:  BENZOYL PEROXIDE 5% GEL: Used to kill bacteria on the skin which could cause an infection at the surgery site.   Please do not use if you have an allergy to benzoyl peroxide. If your skin becomes reddened/irritated stop using the benzoyl peroxide and inform your Doctor.   Starting two days before surgery, apply as follows:  1. Apply benzoyl peroxide gel in the morning and at night. Apply after taking a shower. If you are not taking a shower, clean entire shoulder front, back, and side, along with the armpit with a clean wet washcloth.  2. Place a quarter-sized dollop of the gel on your SHOULDER and rub in thoroughly, making sure to cover the front, back, and side of your shoulder, along with the armpit.   2 Days  prior to Surgery     [facility]  02-06-2022 First Dose  _______ Morning Second Dose  _______ Night  Day Before Surgery           THURSDAY   02-07-2022 First Dose  ______ Morning  On the night before surgery, wash your entire body (except hair, face and private areas) with CHG Soap. THEN, rub in the LAST application of the Benzoyl Peroxide Gel on your shoulder.   3. On the Morning of Surgery wash your BODY AGAIN with CHG Soap (except hair, face and private areas)  4. DO NOT USE THE BENZOYL PEROXIDE GEL ON THE DAY OF YOUR SURGERY    FAILURE TO FOLLOW THESE INSTRUCTIONS MAY RESULT IN THE CANCELLATION OF YOUR SURGERY  PATIENT SIGNATURE_________________________________  NURSE SIGNATURE__________________________________  ________________________________________________________________________        [name]    An incentive spirometer is a  tool that can help keep your lungs clear and active. This tool measures how well you are filling your lungs with each breath. Taking long deep breaths may help reverse or decrease the chance of developing breathing (pulmonary) problems (especially infection) following: A long period of time when you are unable to move or be active. BEFORE THE PROCEDURE  If the spirometer includes an indicator to show your best effort, your nurse or respiratory therapist will set it to a desired goal. If possible, sit up straight or lean slightly forward. Try not to slouch. Hold the incentive spirometer in an upright position. INSTRUCTIONS FOR USE  Sit on the edge of your bed if possible, or sit up as far as you can in bed or on a chair. Hold the incentive spirometer in an upright position. Breathe out normally. Place the mouthpiece in your mouth and seal your lips tightly around it. Breathe in slowly and as deeply as possible, raising the piston or the ball toward the top of the column. Hold your breath for 3-5 seconds or for as long as possible.  Allow the piston or ball to fall to the bottom of the column. Remove the mouthpiece from your mouth and breathe out normally. Rest for a few seconds and repeat Steps 1 through 7 at least 10 times every 1-2 hours when you are awake. Take your time and take a few normal breaths between deep breaths. The spirometer may include an indicator to show your best effort. Use the indicator as a goal to work toward during each repetition. After each set of 10 deep breaths, practice coughing to be sure your lungs are clear. If you have an incision (the cut made at the time of surgery), support your incision when coughing by placing a pillow or rolled up towels firmly against it. Once you are able to get out of bed, walk around indoors and cough well. You may stop using the incentive spirometer when instructed by your caregiver.  RISKS AND COMPLICATIONS Take your time so you do not get dizzy or light-headed. If you are in pain, you may need to take or ask for pain medication before doing incentive spirometry. It is harder to take a deep breath if you are having pain. AFTER USE Rest and breathe slowly and easily. It can be helpful to keep track of a log of your progress. Your caregiver can provide you with a simple table to help with this. If you are using the spirometer at home, follow these instructions: [place] IF:  You are having difficultly using the spirometer. You have trouble using the spirometer as often as instructed. Your pain medication is not giving enough relief while using the spirometer. You develop fever of 100.5 F (38.1 C) or higher.                                                                                                    SEEK IMMEDIATE MEDICAL CARE IF:  You cough up bloody sputum that had not been present before. You develop fever of 102 F (38.9 C)  or greater. You develop worsening pain at or near the incision site. MAKE SURE YOU:  Understand these  instructions. Will watch your condition. Will get help right away if you are not doing well or get worse. Document Released: 07/01/2006 Document Revised: 05/13/2011 Document Reviewed: 09/01/2006 [facility] Patient Information [address], [place].

## 2022-01-31 ENCOUNTER — Other Ambulatory Visit: Payer: Self-pay

## 2022-01-31 ENCOUNTER — Encounter (HOSPITAL_COMMUNITY)
Admission: RE | Admit: 2022-01-31 | Discharge: 2022-01-31 | Disposition: A | Discharge status: Home / Self Care | Payer: Medicare PPO | Source: Ambulatory Visit | Attending: Orthopedic Surgery | Admitting: Orthopedic Surgery

## 2022-01-31 ENCOUNTER — Encounter (HOSPITAL_COMMUNITY): Payer: Self-pay

## 2022-01-31 VITALS — BP 112/63 | HR 58 | Temp 98.0°F | Resp 16 | Ht 64.5 in | Wt 200.0 lb

## 2022-01-31 DIAGNOSIS — I10 Essential (primary) hypertension: Secondary | ICD-10-CM | POA: Insufficient documentation

## 2022-01-31 DIAGNOSIS — Z01818 Encounter for other preprocedural examination: Secondary | ICD-10-CM

## 2022-01-31 DIAGNOSIS — Z01812 Encounter for preprocedural laboratory examination: Secondary | ICD-10-CM | POA: Diagnosis not present

## 2022-01-31 HISTORY — DX: Thyrotoxicosis, unspecified without thyrotoxic crisis or storm: E05.90

## 2022-01-31 LAB — BASIC METABOLIC PANEL
Anion gap: 7 (ref 5–15)
BUN: 24 mg/dL — ABNORMAL HIGH (ref 8–23)
CO2: 29 mmol/L (ref 22–32)
Calcium: 8.7 mg/dL — ABNORMAL LOW (ref 8.9–10.3)
Chloride: 104 mmol/L (ref 98–111)
Creatinine, Ser: 0.85 mg/dL (ref 0.44–1.00)
GFR, Estimated: >60 mL/min (ref >60)
Glucose, Bld: 95 mg/dL (ref 70–99)
Potassium: 4.4 mmol/L (ref 3.5–5.1)
Sodium: 140 mmol/L (ref 135–145)

## 2022-01-31 LAB — CBC
HCT: 43.4 % (ref 36.0–46.0)
Hemoglobin: 14.2 g/dL (ref 12.0–15.0)
MCH: 29.7 pg (ref 26.0–34.0)
MCHC: 32.7 g/dL (ref 30.0–36.0)
MCV: 90.8 fL (ref 80.0–100.0)
Platelets: 221 10*3/uL (ref 150–400)
RBC: 4.78 MIL/uL (ref 3.87–5.11)
RDW: 14.6 % (ref 11.5–15.5)
WBC: 10.2 10*3/uL (ref 4.0–10.5)
nRBC: 0 % (ref 0.0–0.2)

## 2022-01-31 LAB — SURGICAL PCR SCREEN
MRSA, PCR: NEGATIVE
Staphylococcus aureus: NEGATIVE

## 2022-02-08 ENCOUNTER — Other Ambulatory Visit: Payer: Self-pay

## 2022-02-08 ENCOUNTER — Encounter (HOSPITAL_COMMUNITY)
Admission: AD | Disposition: A | Discharge status: Home / Self Care | Payer: Self-pay | Source: Home / Self Care | Attending: Orthopedic Surgery

## 2022-02-08 ENCOUNTER — Encounter (HOSPITAL_COMMUNITY): Payer: Self-pay | Admitting: Orthopedic Surgery

## 2022-02-08 ENCOUNTER — Inpatient Hospital Stay (HOSPITAL_COMMUNITY): Payer: Medicare PPO | Admitting: Anesthesiology

## 2022-02-08 ENCOUNTER — Inpatient Hospital Stay (HOSPITAL_COMMUNITY): Payer: Medicare PPO

## 2022-02-08 ENCOUNTER — Inpatient Hospital Stay (HOSPITAL_COMMUNITY)
Admission: AD | Admit: 2022-02-08 | Discharge: 2022-02-10 | DRG: 483 | Disposition: A | Discharge status: Home / Self Care | Payer: Medicare PPO | Attending: Orthopedic Surgery | Admitting: Orthopedic Surgery

## 2022-02-08 DIAGNOSIS — R531 Weakness: Secondary | ICD-10-CM | POA: Diagnosis not present

## 2022-02-08 DIAGNOSIS — Z87891 Personal history of nicotine dependence: Secondary | ICD-10-CM

## 2022-02-08 DIAGNOSIS — M75102 Unspecified rotator cuff tear or rupture of left shoulder, not specified as traumatic: Secondary | ICD-10-CM | POA: Diagnosis not present

## 2022-02-08 DIAGNOSIS — Z96612 Presence of left artificial shoulder joint: Secondary | ICD-10-CM

## 2022-02-08 DIAGNOSIS — M24112 Other articular cartilage disorders, left shoulder: Secondary | ICD-10-CM

## 2022-02-08 DIAGNOSIS — Z85850 Personal history of malignant neoplasm of thyroid: Secondary | ICD-10-CM

## 2022-02-08 DIAGNOSIS — T8484XA Pain due to internal orthopedic prosthetic devices, implants and grafts, initial encounter: Secondary | ICD-10-CM | POA: Diagnosis present

## 2022-02-08 DIAGNOSIS — G8918 Other acute postprocedural pain: Secondary | ICD-10-CM | POA: Diagnosis not present

## 2022-02-08 DIAGNOSIS — T84068A Wear of articular bearing surface of other internal prosthetic joint, initial encounter: Secondary | ICD-10-CM | POA: Diagnosis not present

## 2022-02-08 DIAGNOSIS — R110 Nausea: Secondary | ICD-10-CM | POA: Diagnosis not present

## 2022-02-08 DIAGNOSIS — Z96642 Presence of left artificial hip joint: Secondary | ICD-10-CM | POA: Diagnosis not present

## 2022-02-08 DIAGNOSIS — Y792 Prosthetic and other implants, materials and accessory orthopedic devices associated with adverse incidents: Secondary | ICD-10-CM | POA: Diagnosis present

## 2022-02-08 DIAGNOSIS — I10 Essential (primary) hypertension: Secondary | ICD-10-CM | POA: Diagnosis present

## 2022-02-08 DIAGNOSIS — Z471 Aftercare following joint replacement surgery: Secondary | ICD-10-CM | POA: Diagnosis not present

## 2022-02-08 HISTORY — PX: TOTAL SHOULDER ARTHROPLASTY: SHX126

## 2022-02-08 LAB — TYPE AND SCREEN
ABO/RH(D): O NEG
Antibody Screen: NEGATIVE

## 2022-02-08 SURGERY — ARTHROPLASTY, SHOULDER, TOTAL
Anesthesia: Regional | Site: Shoulder | Laterality: Left

## 2022-02-08 MED ORDER — FENTANYL CITRATE PF 50 MCG/ML IJ SOSY
PREFILLED_SYRINGE | INTRAMUSCULAR | Status: AC
  Filled 2022-02-08: qty 2

## 2022-02-08 MED ORDER — MAGNESIUM OXIDE -MG SUPPLEMENT 400 (240 MG) MG PO TABS
400.0000 mg | ORAL_TABLET | Freq: Every day | ORAL | Status: DC
  Administered 2022-02-09: 400 mg via ORAL
  Filled 2022-02-08: qty 1

## 2022-02-08 MED ORDER — B COMPLEX-C PO TABS
1.0000 | ORAL_TABLET | Freq: Every day | ORAL | Status: DC
  Administered 2022-02-09: 1 via ORAL
  Filled 2022-02-08: qty 1

## 2022-02-08 MED ORDER — METHOCARBAMOL 500 MG IVPB - SIMPLE MED
INTRAVENOUS | Status: AC
  Filled 2022-02-08: qty 55

## 2022-02-08 MED ORDER — ALPRAZOLAM 0.25 MG PO TABS: 0.2500 mg | ORAL_TABLET | Freq: Two times a day (BID) | ORAL | Status: DC | PRN

## 2022-02-08 MED ORDER — UMECLIDINIUM BROMIDE 62.5 MCG/ACT IN AEPB
1.0000 | INHALATION_SPRAY | Freq: Every day | RESPIRATORY_TRACT | Status: DC
  Filled 2022-02-08: qty 7

## 2022-02-08 MED ORDER — CEFAZOLIN SODIUM-DEXTROSE 2-4 GM/100ML-% IV SOLN
2.0000 g | INTRAVENOUS | Status: AC
  Administered 2022-02-08: 2 g via INTRAVENOUS
  Filled 2022-02-08: qty 100

## 2022-02-08 MED ORDER — METOCLOPRAMIDE HCL 5 MG PO TABS: 5.0000 mg | ORAL_TABLET | Freq: Three times a day (TID) | ORAL | Status: DC | PRN

## 2022-02-08 MED ORDER — SODIUM CHLORIDE 0.9 % IR SOLN: Status: DC | PRN

## 2022-02-08 MED ORDER — PHENYLEPHRINE HCL (PRESSORS) 10 MG/ML IV SOLN
INTRAVENOUS | Status: AC
  Filled 2022-02-08: qty 1

## 2022-02-08 MED ORDER — BUSPIRONE HCL 5 MG PO TABS
7.5000 mg | ORAL_TABLET | Freq: Two times a day (BID) | ORAL | Status: DC
  Administered 2022-02-08 – 2022-02-09 (×3): 7.5 mg via ORAL
  Filled 2022-02-08 (×3): qty 2

## 2022-02-08 MED ORDER — METHOCARBAMOL 500 MG IVPB - SIMPLE MED
500.0000 mg | Freq: Four times a day (QID) | INTRAVENOUS | Status: DC | PRN
  Administered 2022-02-08: 500 mg via INTRAVENOUS

## 2022-02-08 MED ORDER — SUCCINYLCHOLINE CHLORIDE 200 MG/10ML IV SOSY
PREFILLED_SYRINGE | INTRAVENOUS | Status: DC | PRN
  Administered 2022-02-08: 120 mg via INTRAVENOUS

## 2022-02-08 MED ORDER — DEXAMETHASONE SODIUM PHOSPHATE 10 MG/ML IJ SOLN
INTRAMUSCULAR | Status: AC
  Filled 2022-02-08: qty 1

## 2022-02-08 MED ORDER — THROMBIN (RECOMBINANT) 5000 UNITS EX SOLR
CUTANEOUS | Status: AC
  Filled 2022-02-08: qty 5000

## 2022-02-08 MED ORDER — CHLORHEXIDINE GLUCONATE 0.12 % MT SOLN
15.0000 mL | Freq: Once | OROMUCOSAL | Status: AC
  Administered 2022-02-08: 15 mL via OROMUCOSAL

## 2022-02-08 MED ORDER — VITAMIN D 25 MCG (1000 UNIT) PO TABS
5000.0000 [IU] | ORAL_TABLET | Freq: Every day | ORAL | Status: DC
  Administered 2022-02-09: 5000 [IU] via ORAL
  Filled 2022-02-08: qty 5

## 2022-02-08 MED ORDER — PHENYLEPHRINE 80 MCG/ML (10ML) SYRINGE FOR IV PUSH (FOR BLOOD PRESSURE SUPPORT)
PREFILLED_SYRINGE | INTRAVENOUS | Status: DC | PRN
  Administered 2022-02-08: 160 ug via INTRAVENOUS

## 2022-02-08 MED ORDER — EPHEDRINE SULFATE-NACL 50-0.9 MG/10ML-% IV SOSY
PREFILLED_SYRINGE | INTRAVENOUS | Status: DC | PRN
  Administered 2022-02-08: 5 mg via INTRAVENOUS
  Administered 2022-02-08 (×2): 10 mg via INTRAVENOUS

## 2022-02-08 MED ORDER — STERILE WATER FOR IRRIGATION IR SOLN
Status: DC | PRN
  Administered 2022-02-08: 1000 mL

## 2022-02-08 MED ORDER — TRANEXAMIC ACID-NACL 1000-0.7 MG/100ML-% IV SOLN
1000.0000 mg | Freq: Once | INTRAVENOUS | Status: AC
  Administered 2022-02-08: 1000 mg via INTRAVENOUS
  Filled 2022-02-08: qty 100

## 2022-02-08 MED ORDER — PHENYLEPHRINE HCL-NACL 20-0.9 MG/250ML-% IV SOLN
INTRAVENOUS | Status: DC | PRN
  Administered 2022-02-08: 40 ug/min via INTRAVENOUS

## 2022-02-08 MED ORDER — HYDROCODONE-ACETAMINOPHEN 5-325 MG PO TABS
ORAL_TABLET | ORAL | Status: AC
  Filled 2022-02-08: qty 2

## 2022-02-08 MED ORDER — SODIUM CHLORIDE 0.9 % IV SOLN: INTRAVENOUS | Status: DC

## 2022-02-08 MED ORDER — PHENYLEPHRINE 80 MCG/ML (10ML) SYRINGE FOR IV PUSH (FOR BLOOD PRESSURE SUPPORT)
PREFILLED_SYRINGE | INTRAVENOUS | Status: AC
  Filled 2022-02-08: qty 30

## 2022-02-08 MED ORDER — 0.9 % SODIUM CHLORIDE (POUR BTL) OPTIME
TOPICAL | Status: DC | PRN
  Administered 2022-02-08: 1000 mL

## 2022-02-08 MED ORDER — FENTANYL CITRATE (PF) 100 MCG/2ML IJ SOLN
INTRAMUSCULAR | Status: DC | PRN
  Administered 2022-02-08: 25 ug via INTRAVENOUS
  Administered 2022-02-08: 50 ug via INTRAVENOUS
  Administered 2022-02-08: 25 ug via INTRAVENOUS

## 2022-02-08 MED ORDER — CEFAZOLIN SODIUM-DEXTROSE 2-4 GM/100ML-% IV SOLN
2.0000 g | Freq: Four times a day (QID) | INTRAVENOUS | Status: AC
  Administered 2022-02-08 – 2022-02-09 (×3): 2 g via INTRAVENOUS
  Filled 2022-02-08 (×3): qty 100

## 2022-02-08 MED ORDER — ONDANSETRON HCL 4 MG/2ML IJ SOLN
INTRAMUSCULAR | Status: AC
  Filled 2022-02-08: qty 2

## 2022-02-08 MED ORDER — FENTANYL CITRATE PF 50 MCG/ML IJ SOSY
50.0000 ug | PREFILLED_SYRINGE | INTRAMUSCULAR | Status: DC
  Administered 2022-02-08: 50 ug via INTRAVENOUS
  Filled 2022-02-08: qty 2

## 2022-02-08 MED ORDER — MENTHOL 3 MG MT LOZG: 1.0000 | LOZENGE | OROMUCOSAL | Status: DC | PRN

## 2022-02-08 MED ORDER — BISACODYL 10 MG RE SUPP: 10.0000 mg | Freq: Every day | RECTAL | Status: DC | PRN

## 2022-02-08 MED ORDER — ACETAMINOPHEN 500 MG PO TABS: 1000.0000 mg | ORAL_TABLET | Freq: Once | ORAL | Status: DC

## 2022-02-08 MED ORDER — LIDOCAINE 2% (20 MG/ML) 5 ML SYRINGE
INTRAMUSCULAR | Status: DC | PRN
  Administered 2022-02-08: 60 mg via INTRAVENOUS

## 2022-02-08 MED ORDER — PROPOFOL 10 MG/ML IV BOLUS
INTRAVENOUS | Status: DC | PRN
  Administered 2022-02-08: 150 mg via INTRAVENOUS

## 2022-02-08 MED ORDER — ACETAMINOPHEN 325 MG PO TABS: 325.0000 mg | ORAL_TABLET | Freq: Four times a day (QID) | ORAL | Status: DC | PRN

## 2022-02-08 MED ORDER — LACTATED RINGERS IV SOLN: INTRAVENOUS | Status: DC

## 2022-02-08 MED ORDER — BUDESON-GLYCOPYRROL-FORMOTEROL 160-9-4.8 MCG/ACT IN AERO: 2.0000 | INHALATION_SPRAY | Freq: Two times a day (BID) | RESPIRATORY_TRACT | Status: DC

## 2022-02-08 MED ORDER — BUPIVACAINE-EPINEPHRINE (PF) 0.5% -1:200000 IJ SOLN
INTRAMUSCULAR | Status: AC
  Filled 2022-02-08: qty 30

## 2022-02-08 MED ORDER — HYDROCODONE-ACETAMINOPHEN 5-325 MG PO TABS
1.0000 | ORAL_TABLET | ORAL | Status: DC | PRN
  Administered 2022-02-08 (×2): 2 via ORAL
  Administered 2022-02-09 (×2): 1 via ORAL
  Administered 2022-02-09 – 2022-02-10 (×5): 2 via ORAL
  Filled 2022-02-08 (×7): qty 2
  Filled 2022-02-08: qty 1

## 2022-02-08 MED ORDER — ONDANSETRON HCL 4 MG PO TABS: 4.0000 mg | ORAL_TABLET | Freq: Three times a day (TID) | ORAL | 1 refills | Status: DC | PRN

## 2022-02-08 MED ORDER — FLUTICASONE FUROATE-VILANTEROL 200-25 MCG/ACT IN AEPB
1.0000 | INHALATION_SPRAY | Freq: Every day | RESPIRATORY_TRACT | Status: DC
  Filled 2022-02-08: qty 28

## 2022-02-08 MED ORDER — PROPOFOL 10 MG/ML IV BOLUS
INTRAVENOUS | Status: AC
  Filled 2022-02-08: qty 20

## 2022-02-08 MED ORDER — HYDROCHLOROTHIAZIDE 25 MG PO TABS
25.0000 mg | ORAL_TABLET | Freq: Every morning | ORAL | Status: DC
  Administered 2022-02-09: 25 mg via ORAL
  Filled 2022-02-08: qty 1

## 2022-02-08 MED ORDER — SERTRALINE HCL 100 MG PO TABS
150.0000 mg | ORAL_TABLET | Freq: Every day | ORAL | Status: DC
  Administered 2022-02-09: 150 mg via ORAL
  Filled 2022-02-08: qty 2

## 2022-02-08 MED ORDER — VITAMIN C 500 MG PO TABS
1000.0000 mg | ORAL_TABLET | Freq: Every day | ORAL | Status: DC
  Administered 2022-02-09: 1000 mg via ORAL
  Filled 2022-02-08: qty 2

## 2022-02-08 MED ORDER — FENTANYL CITRATE (PF) 100 MCG/2ML IJ SOLN
INTRAMUSCULAR | Status: AC
  Filled 2022-02-08: qty 2

## 2022-02-08 MED ORDER — ONDANSETRON HCL 4 MG/2ML IJ SOLN
4.0000 mg | Freq: Four times a day (QID) | INTRAMUSCULAR | Status: DC | PRN
  Administered 2022-02-08: 4 mg via INTRAVENOUS

## 2022-02-08 MED ORDER — DOCUSATE SODIUM 100 MG PO CAPS
100.0000 mg | ORAL_CAPSULE | Freq: Two times a day (BID) | ORAL | Status: DC
  Administered 2022-02-08 – 2022-02-09 (×3): 100 mg via ORAL
  Filled 2022-02-08 (×3): qty 1

## 2022-02-08 MED ORDER — BUPIVACAINE HCL (PF) 0.5 % IJ SOLN
INTRAMUSCULAR | Status: DC | PRN
  Administered 2022-02-08: 15 mL via PERINEURAL

## 2022-02-08 MED ORDER — METHOCARBAMOL 500 MG PO TABS: 500.0000 mg | ORAL_TABLET | Freq: Three times a day (TID) | ORAL | 1 refills | Status: DC | PRN

## 2022-02-08 MED ORDER — LEVOTHYROXINE SODIUM 50 MCG PO TABS
175.0000 ug | ORAL_TABLET | Freq: Every day | ORAL | Status: DC
  Administered 2022-02-09 – 2022-02-10 (×2): 175 ug via ORAL
  Filled 2022-02-08 (×2): qty 1

## 2022-02-08 MED ORDER — MIDAZOLAM HCL 2 MG/2ML IJ SOLN
1.0000 mg | INTRAMUSCULAR | Status: DC
  Administered 2022-02-08: 2 mg via INTRAVENOUS
  Filled 2022-02-08: qty 2

## 2022-02-08 MED ORDER — ROCURONIUM BROMIDE 10 MG/ML (PF) SYRINGE
PREFILLED_SYRINGE | INTRAVENOUS | Status: AC
  Filled 2022-02-08: qty 10

## 2022-02-08 MED ORDER — LIDOCAINE-EPINEPHRINE (PF) 1 %-1:200000 IJ SOLN: INTRAMUSCULAR | Status: DC | PRN

## 2022-02-08 MED ORDER — ORAL CARE MOUTH RINSE: 15.0000 mL | Freq: Once | OROMUCOSAL | Status: AC

## 2022-02-08 MED ORDER — ONDANSETRON HCL 4 MG/2ML IJ SOLN
INTRAMUSCULAR | Status: DC | PRN
  Administered 2022-02-08: 4 mg via INTRAVENOUS

## 2022-02-08 MED ORDER — FENTANYL CITRATE PF 50 MCG/ML IJ SOSY
PREFILLED_SYRINGE | INTRAMUSCULAR | Status: AC
  Filled 2022-02-08: qty 1

## 2022-02-08 MED ORDER — DEXAMETHASONE SODIUM PHOSPHATE 10 MG/ML IJ SOLN
INTRAMUSCULAR | Status: DC | PRN
  Administered 2022-02-08: 8 mg via INTRAVENOUS

## 2022-02-08 MED ORDER — FENTANYL CITRATE PF 50 MCG/ML IJ SOSY
25.0000 ug | PREFILLED_SYRINGE | INTRAMUSCULAR | Status: DC | PRN
  Administered 2022-02-08 (×3): 50 ug via INTRAVENOUS

## 2022-02-08 MED ORDER — POLYETHYLENE GLYCOL 3350 17 G PO PACK: 17.0000 g | PACK | Freq: Every day | ORAL | Status: DC | PRN

## 2022-02-08 MED ORDER — METHOCARBAMOL 500 MG PO TABS
500.0000 mg | ORAL_TABLET | Freq: Four times a day (QID) | ORAL | Status: DC | PRN
  Administered 2022-02-09 – 2022-02-10 (×3): 500 mg via ORAL
  Filled 2022-02-08 (×3): qty 1

## 2022-02-08 MED ORDER — PHENOL 1.4 % MT LIQD: 1.0000 | OROMUCOSAL | Status: DC | PRN

## 2022-02-08 MED ORDER — BUPIVACAINE LIPOSOME 1.3 % IJ SUSP
INTRAMUSCULAR | Status: DC | PRN
  Administered 2022-02-08: 10 mL via PERINEURAL

## 2022-02-08 MED ORDER — METOCLOPRAMIDE HCL 5 MG/ML IJ SOLN: 5.0000 mg | Freq: Three times a day (TID) | INTRAMUSCULAR | Status: DC | PRN

## 2022-02-08 MED ORDER — LORATADINE 10 MG PO TABS
10.0000 mg | ORAL_TABLET | Freq: Every day | ORAL | Status: DC
  Administered 2022-02-09: 10 mg via ORAL
  Filled 2022-02-08: qty 1

## 2022-02-08 MED ORDER — BUPIVACAINE-EPINEPHRINE 0.5% -1:200000 IJ SOLN
INTRAMUSCULAR | Status: DC | PRN
  Administered 2022-02-08: 20 mL

## 2022-02-08 MED ORDER — ONDANSETRON HCL 4 MG PO TABS
4.0000 mg | ORAL_TABLET | Freq: Four times a day (QID) | ORAL | Status: DC | PRN
  Administered 2022-02-09 – 2022-02-10 (×2): 4 mg via ORAL
  Filled 2022-02-08 (×2): qty 1

## 2022-02-08 MED ORDER — ALBUTEROL SULFATE (2.5 MG/3ML) 0.083% IN NEBU: 2.5000 mg | INHALATION_SOLUTION | Freq: Four times a day (QID) | RESPIRATORY_TRACT | Status: DC | PRN

## 2022-02-08 MED ORDER — FLUTICASONE PROPIONATE 50 MCG/ACT NA SUSP
1.0000 | Freq: Every day | NASAL | Status: DC
  Filled 2022-02-08: qty 16

## 2022-02-08 MED ORDER — MORPHINE SULFATE (PF) 2 MG/ML IV SOLN
0.5000 mg | INTRAVENOUS | Status: DC | PRN
  Administered 2022-02-09: 0.5 mg via INTRAVENOUS
  Filled 2022-02-08: qty 1

## 2022-02-08 MED ORDER — HYDROCODONE-ACETAMINOPHEN 5-325 MG PO TABS: 1.0000 | ORAL_TABLET | Freq: Four times a day (QID) | ORAL | 0 refills | Status: DC | PRN

## 2022-02-08 SURGICAL SUPPLY — 80 items
AID PSTN UNV HD RSTRNT DISP (MISCELLANEOUS) ×1
BAG COUNTER SPONGE SURGICOUNT (BAG) IMPLANT
BAG SPEC THK2 15X12 ZIP CLS (MISCELLANEOUS)
BAG SPNG CNTER NS LX DISP (BAG) ×1
BAG ZIPLOCK 12X15 (MISCELLANEOUS) IMPLANT
BIT DRILL 1.6MX128 (BIT) ×1 IMPLANT
BIT DRILL 170X2.5X (BIT) IMPLANT
BIT DRL 170X2.5X (BIT) ×1
BLADE SAG 18X100X1.27 (BLADE) ×1 IMPLANT
CEMENT HV SMART SET (Cement) ×1 IMPLANT
CHIP CORTICOCANCELLOUS 30CC (Bone Implant) ×1 IMPLANT
CHIPS CORTICOCANCELLOUS 30CC (Bone Implant) ×1 IMPLANT
CONTROL EPI SZ1 (Orthopedic Implant) IMPLANT
COVER BACK TABLE 60X90IN (DRAPES) ×1 IMPLANT
COVER SURGICAL LIGHT HANDLE (MISCELLANEOUS) ×1 IMPLANT
CTR EPI SZ1 (Orthopedic Implant) ×1 IMPLANT
DRAPE INCISE IOBAN 66X45 STRL (DRAPES) ×1 IMPLANT
DRAPE ORTHO SPLIT 77X108 STRL (DRAPES) ×2
DRAPE SHEET LG 3/4 BI-LAMINATE (DRAPES) ×1 IMPLANT
DRAPE SURG ORHT 6 SPLT 77X108 (DRAPES) ×2 IMPLANT
DRAPE U-SHAPE 47X51 STRL (DRAPES) ×1 IMPLANT
DRILL 2.5 (BIT) ×1
DRSG ADAPTIC 3X8 NADH LF (GAUZE/BANDAGES/DRESSINGS) ×1 IMPLANT
DURAPREP 26ML APPLICATOR (WOUND CARE) ×1 IMPLANT
ELECT BLADE TIP CTD 4 INCH (ELECTRODE) ×1 IMPLANT
ELECT NDL TIP 2.8 STRL (NEEDLE) ×1 IMPLANT
ELECT NEEDLE TIP 2.8 STRL (NEEDLE) ×1 IMPLANT
ELECT REM PT RETURN 15FT ADLT (MISCELLANEOUS) ×1 IMPLANT
FACESHIELD WRAPAROUND (MASK) ×3 IMPLANT
FACESHIELD WRAPAROUND OR TEAM (MASK) IMPLANT
GAUZE PAD ABD 8X10 STRL (GAUZE/BANDAGES/DRESSINGS) ×1 IMPLANT
GAUZE SPONGE 4X4 12PLY STRL (GAUZE/BANDAGES/DRESSINGS) ×1 IMPLANT
GLENOSPHERE DELTA XTEND LAT 38 (Miscellaneous) IMPLANT
GLOVE BIOGEL PI IND STRL 7.5 (GLOVE) ×1 IMPLANT
GLOVE BIOGEL PI IND STRL 8.5 (GLOVE) ×1 IMPLANT
GLOVE ORTHO TXT STRL SZ7.5 (GLOVE) ×1 IMPLANT
GLOVE SURG ORTHO 8.5 STRL (GLOVE) ×1 IMPLANT
GOWN STRL REUS W/ TWL XL LVL3 (GOWN DISPOSABLE) ×2 IMPLANT
GOWN STRL REUS W/TWL XL LVL3 (GOWN DISPOSABLE) ×2
GRAFT BNE CORT CANC CHIPS 30CC (Bone Implant) IMPLANT
HOOD PEEL AWAY FLYTE STAYCOOL (MISCELLANEOUS) IMPLANT
KIT BASIN OR (CUSTOM PROCEDURE TRAY) ×1 IMPLANT
KIT TURNOVER KIT A (KITS) IMPLANT
MANIFOLD NEPTUNE II (INSTRUMENTS) ×1 IMPLANT
METAGLENE DELTA EXTEND (Trauma) IMPLANT
METAGLENE DXTEND (Trauma) ×1 IMPLANT
NDL MAYO CATGUT SZ4 TPR NDL (NEEDLE) ×1 IMPLANT
NEEDLE MAYO CATGUT SZ4 (NEEDLE) ×1 IMPLANT
PACK SHOULDER (CUSTOM PROCEDURE TRAY) ×1 IMPLANT
PAD ORTHO SHOULDER 7X19 LRG (SOFTGOODS) IMPLANT
PASSER SUT SWANSON 36MM LOOP (INSTRUMENTS) IMPLANT
PIN GUIDE 1.2 (PIN) IMPLANT
PIN GUIDE GLENOPHERE 1.5MX300M (PIN) IMPLANT
PIN METAGLENE 2.5 (PIN) IMPLANT
PROTECTOR NERVE ULNAR (MISCELLANEOUS) ×1 IMPLANT
RESTRAINT HEAD UNIVERSAL NS (MISCELLANEOUS) ×1 IMPLANT
SCREW 4.5X18MM (Screw) ×1 IMPLANT
SCREW BN 18X4.5XSTRL SHLDR (Screw) IMPLANT
SCREW LOCK DELTA XTEND 4.5X30 (Screw) IMPLANT
SLING ARM FOAM STRAP LRG (SOFTGOODS) ×1 IMPLANT
SMARTMIX MINI TOWER (MISCELLANEOUS) ×1
SPACER 38 PLUS 3 (Spacer) IMPLANT
SPIKE FLUID TRANSFER (MISCELLANEOUS) ×1 IMPLANT
SPONGE SURGIFOAM ABS GEL 12-7 (HEMOSTASIS) ×1 IMPLANT
SPONGE T-LAP 4X18 ~~LOC~~+RFID (SPONGE) ×2 IMPLANT
STEM STANDARD SZ 10 113MM (Stem) ×1 IMPLANT
STEM STD SZ 10 113MM (Stem) IMPLANT
STRIP CLOSURE SKIN 1/2X4 (GAUZE/BANDAGES/DRESSINGS) ×1 IMPLANT
SUCTION FRAZIER HANDLE 12FR (TUBING) ×1
SUCTION TUBE FRAZIER 12FR DISP (TUBING) ×1 IMPLANT
SUT FIBERWIRE #2 38 T-5 BLUE (SUTURE) ×4
SUT MNCRL AB 4-0 PS2 18 (SUTURE) ×1 IMPLANT
SUT VIC AB 0 CT1 36 (SUTURE) ×1 IMPLANT
SUT VIC AB 0 CT2 27 (SUTURE) ×1 IMPLANT
SUT VIC AB 2-0 CT1 27 (SUTURE) ×1
SUT VIC AB 2-0 CT1 TAPERPNT 27 (SUTURE) ×1 IMPLANT
SUTURE FIBERWR #2 38 T-5 BLUE (SUTURE) ×4 IMPLANT
TAPE PAPER 3X10 WHT MICROPORE (GAUZE/BANDAGES/DRESSINGS) IMPLANT
TOWEL OR 17X26 10 PK STRL BLUE (TOWEL DISPOSABLE) ×1 IMPLANT
TOWER SMARTMIX MINI (MISCELLANEOUS) ×1 IMPLANT

## 2022-02-08 NOTE — Anesthesia Procedure Notes (Signed)
Procedure Name: Intubation Date/Time: 02/08/2022 2:55 PM  Performed by: Niel Hummer, CRNAPre-anesthesia Checklist: Patient identified, Suction available, Emergency Drugs available and Patient being monitored Patient Re-evaluated:Patient Re-evaluated prior to induction Oxygen Delivery Method: Circle system utilized Preoxygenation: Pre-oxygenation with 100% oxygen Induction Type: IV induction Laryngoscope Size: Mac and 4 Grade View: Grade II Tube type: Oral Tube size: 7.0 mm Number of attempts: 1 Airway Equipment and Method: Stylet Placement Confirmation: ETT inserted through vocal cords under direct vision, positive ETCO2 and breath sounds checked- equal and bilateral Secured at: 24 cm Tube secured with: Tape Dental Injury: Teeth and Oropharynx as per pre-operative assessment

## 2022-02-08 NOTE — Transfer of Care (Signed)
Immediate Anesthesia Transfer of Care Note  Patient: [name]  Procedure(s) Performed: SHOULDER REVISION TO TOTAL SHOULDER ARTHROPLASTY (Left: Shoulder)  Patient Location: PACU  Anesthesia Type:General  Level of Consciousness: awake, alert , oriented, and patient cooperative  Airway & Oxygen Therapy: Patient Spontanous Breathing and Patient connected to face mask oxygen  Post-op Assessment: Report given to RN, Post -op Vital signs reviewed and stable, and Patient moving all extremities X 4  Post vital signs: Reviewed and stable  Last Vitals:  Vitals Value Taken Time  BP 150/69 02/08/22 1705  Temp    Pulse 77 02/08/22 1708  Resp 13 02/08/22 1708  SpO2 94 % 02/08/22 1708  Vitals shown include unvalidated device data.  Last Pain:  Vitals:   02/08/22 1420  TempSrc:   PainSc: 0-No pain         Complications: No notable events documented.

## 2022-02-08 NOTE — Interval H&P Note (Signed)
History and Physical Interval Note:  02/08/2022 2:32 PM  [name]  has presented today for surgery, with the diagnosis of left shoulder hemi arthoplasty.  The various methods of treatment have been discussed with the patient and family. After consideration of risks, benefits and other options for treatment, the patient has consented to  Procedure(s) with comments: [name] (Left) - 120 MIN CHOICE WITH INTERSCALENE BLOCK as a surgical intervention.  The patient's history has been reviewed, patient examined, no change in status, stable for surgery.  I have reviewed the patient's chart and labs.  Questions were answered to the patient's satisfaction.     Augustin Schooling

## 2022-02-08 NOTE — Discharge Instructions (Signed)
Ice to the shoulder constantly.  Keep the incision covered and clean and dry for one week, then ok to get it wet in the shower. Leave the incision uncovered after one week and open to air.   Do exercise as instructed several times per day. Go slow and do not push through pain.   DO NOT reach behind your back or push up out of a chair with the operative arm.  Use a sling while you are up and around for comfort, may remove while seated.  Keep pillow propped behind the operative elbow. Make sure that you can see your elbow at all times.   Follow up with Dr Veverly Fells in two weeks in the office, call [phone] for appt  Call Dr Veverly Fells at [phone] (cell) with any questions or concerns

## 2022-02-08 NOTE — Anesthesia Procedure Notes (Signed)
Anesthesia Regional Block: Interscalene brachial plexus block   Pre-Anesthetic Checklist: , timeout performed,  Correct Patient, Correct Site, Correct Laterality,  Correct Procedure, Correct Position, site marked,  Risks and benefits discussed,  Pre-op evaluation,  At surgeon's request and post-op pain management  Laterality: Left  Prep: Maximum Sterile Barrier Precautions used, chloraprep       Needles:  Injection technique: Single-shot  Needle Type: Echogenic Stimulator Needle     Needle Length: 5cm  Needle Gauge: 21     Additional Needles:   Procedures:,,,, ultrasound used (permanent image in chart),,    Narrative:  Start time: 02/08/2022 2:18 PM End time: 02/08/2022 2:21 PM Injection made incrementally with aspirations every 5 mL. Anesthesiologist: Freddrick March, MD

## 2022-02-08 NOTE — Anesthesia Preprocedure Evaluation (Addendum)
Anesthesia Evaluation  Patient identified by MRN, date of birth, ID band Patient awake    Reviewed: Allergy & Precautions, NPO status , Patient's Chart, lab work & pertinent test results  Airway Mallampati: I  TM Distance: >3 FB Neck ROM: Full    Dental  (+) Teeth Intact, Dental Advisory Given   Pulmonary shortness of breath, asthma , former smoker   Pulmonary exam normal breath sounds clear to auscultation       Cardiovascular hypertension, Normal cardiovascular exam Rhythm:Regular Rate:Normal     Neuro/Psych  PSYCHIATRIC DISORDERS Anxiety Depression    negative neurological ROS     GI/Hepatic Neg liver ROS, hiatal hernia,GERD  ,,  Endo/Other  negative endocrine ROS    Renal/GU negative Renal ROS  negative genitourinary   Musculoskeletal  (+) Arthritis ,    Abdominal   Peds  Hematology  (+) Blood dyscrasia, anemia   Anesthesia Other Findings   Reproductive/Obstetrics                             Anesthesia Physical Anesthesia Plan  ASA: 2  Anesthesia Plan: General and Regional   Post-op Pain Management: Regional block* and Tylenol PO (pre-op)*   Induction: Intravenous  PONV Risk Score and Plan: 3 and Midazolam, Dexamethasone and Ondansetron  Airway Management Planned: Oral ETT  Additional Equipment:   Intra-op Plan:   Post-operative Plan: Extubation in OR  Informed Consent: I have reviewed the patients History and Physical, chart, labs and discussed the procedure including the risks, benefits and alternatives for the proposed anesthesia with the patient or authorized representative who has indicated his/her understanding and acceptance.     Dental advisory given  Plan Discussed with: CRNA  Anesthesia Plan Comments:        Anesthesia Quick Evaluation

## 2022-02-08 NOTE — Brief Op Note (Signed)
02/08/2022  5:03 PM  PATIENT:  [name]  66 y.o. female  PRE-OPERATIVE DIAGNOSIS:  left shoulder hemi arthoplasty, with glenoid wear/arthritis  POST-OPERATIVE DIAGNOSIS:  left shoulder hemi arthoplasty, with glenoid wear and rotator cuff insufficiency   PROCEDURE:  Left shoulder revision from hemiarthroplasty to reverse total shoulder replacement, NO subscap repair   SURGEON:  Surgeon(s) and Role:    Netta Cedars, MD - Primary  PHYSICIAN ASSISTANT:   ASSISTANTS: Ventura Bruns, PA-C   ANESTHESIA:   regional and general  EBL:  300 mL   BLOOD ADMINISTERED:none  DRAINS: none   LOCAL MEDICATIONS USED:  MARCAINE     SPECIMEN:  No Specimen  DISPOSITION OF SPECIMEN:  N/A  COUNTS:  YES  TOURNIQUET:  * No tourniquets in log *  DICTATION: .Other Dictation: Dictation Number [id number]  PLAN OF CARE: Admit for overnight observation  PATIENT DISPOSITION:  PACU - hemodynamically stable.   Delay start of Pharmacological VTE agent (>24hrs) due to surgical blood loss or risk of bleeding: not applicable

## 2022-02-08 NOTE — Op Note (Unsigned)
NAMEKIRSTA, [name] MEDICAL RECORD NO: [id number] ACCOUNT NO: [id number] DATE OF BIRTH: January 23, 1956 FACILITY: [facility] LOCATION: [facility] PHYSICIAN: Doran Heater. Veverly Fells, MD  Operative Report   DATE OF PROCEDURE: 02/08/2022  PREOPERATIVE DIAGNOSIS:  Left shoulder hemiarthroplasty with progressive pain and glenoid wear.  POSTOPERATIVE DIAGNOSIS:  Left shoulder hemiarthroplasty with progressive pain and glenoid wear as well as rotator cuff insufficiency.  PROCEDURES PERFORMED:  Left revision of hemiarthroplasty to reverse total shoulder arthroplasty utilizing DePuy Delta Xtend prosthesis with no subscapularis repair.  ATTENDING SURGEON:  Doran Heater. Veverly Fells, MD  ASSISTANT:  Charletta Cousin [place], [place], who was scrubbed during the entire procedure, and necessary for satisfactory completion of surgery.  ANESTHESIA:  General anesthesia plus interscalene block and local was used.  ESTIMATED BLOOD LOSS:  300 mL.  FLUID REPLACEMENT:  1500 mL crystalloid.  COUNTS:  Instrument counts correct.  COMPLICATIONS:  No complications.  ANTIBIOTICS:  Perioperative antibiotics were given.  INDICATIONS:  The patient is a 66 year old female who presents with a history of a prior left shoulder hemiarthroplasty for arthritis done 18 years ago.  The patient had done well until recently when she began having progressive pain.  The patient's  imaging, x-ray and CT had showed progressive cartilage loss on the glenoid side to where she is now metal-on-bone.  The patient did have well preserved function, but just worsening pain interfering with sleep and activities of daily living.  We discussed  options for management and discussed revision to either anatomic total shoulder arthroplasty versus reverse shoulder arthroplasty.  The patient did have a decreased acromiohumeral interval, which was worrisome for pronounced thinning of the rotator cuff  as well as a superior engagement of the humeral component on the glenoid itself,  also worrisome for rotator cuff insufficiency and despite her excellent function, our concern was for her rotator cuff and we will be inspecting this prior to converting to  an anatomic replacement, but we did discuss both options and she agreed to move forward with conversion to total shoulder reverse versus anatomic depending on what will be found in surgery.  Informed consent obtained.  DESCRIPTION OF PROCEDURE:  After an adequate level of anesthesia was achieved, the patient was positioned in the modified beach chair position.  Left shoulder correctly identified and sterilely prepped and draped in the usual manner.  Timeout called,  verifying correct patient, correct site. We entered the patient's shoulder using a deltopectoral incision starting at the coracoid process, extending down to the anterior humerus.  Dissection down through subcutaneous tissues using Bovie.  Cephalic vein  was not identified.  We went ahead and identified the proper deltopectoral interval and developed that down to the subdeltoid space.  We identified the subscapularis and the conjoined tendon.  We were able to free the conjoined off the subscap, which was  extremely thin.  I could almost see through it where the metal head was and thus, we knew that we would not be able to move forward with an anatomic replacement.  We went ahead and released the subscapularis remnant and tagged for protection of the  axillary nerve and released the inferior capsule and progressed to externally rotating and then released the very thin rotator cuff anteriorly back to the infraspinatus.  We kept the infraspinatus and teres minor.  We used a tuning fork to remove the  humeral head.  We then went ahead and used flexible osteotomes proximally to free up the implant from around the proximal humerus and then I took  the broach in and then put that onto the stem and then using the mallet, was able to back the stem out of  the humerus.  Once the stem  was out, there was no signs of any infection during this approach to the shoulder.  The fluid that was in there was clear fluid.  At this point, we used a trial stem to protect the bone and then subluxed the humerus  posteriorly, and that gave [place] good exposure of the glenoid, which was devoid of any cartilage.  I was able to remove the capsule and the labrum.  The labrum had been calcified.  We had all that released and removed and had good exposure of the glenoid,  so we placed our guide pin, centered low on the glenoid and then reamed for the metaglene baseplate.  We then did our peripheral hand reaming and then drilled our central peg hole and impacted the metaglene into position.  We placed a 36 screw  inferiorly, a 36 screw superiorly to the base of the coracoid, we just used 2 screws.  With those 2 screws in place, we had excellent baseplate security.  I selected a 38 standard glenosphere and attached that to the baseplate.  I did a finger sweep to make sure we had no soft  tissue caught around the implant.  Next, we went to the humeral side.  The patient had a fairly small AP diameter.  We removed that trial stem and then we placed our reamer guide and then reamed for the 1 centered metaphysis and then we trialled with a  10 stem and a 1 centered.  I did have to break through the patient's pedestal distally with a 6 mm hand reamer, T-handled reamer and then an 8 and then a 10, but that allowed for full seating of the patient's humeral component.  So we used a 10 stem with  a 1 centered metaphysis and trialed that.  We felt like with a 38+3 poly, we had appropriate length, humeral length and tensioning.  Conjoined was appropriately tensioned. No gapping with inferior pole or external rotation.  We removed all the trial  components from the humeral side.  I did wind up as we did not have available bone graft from the head, we selected some cancellous allograft and used that with impaction grafting  technique for the [place] 10 stem and the HA press-fit 1 centered  metaphysis and we had that set on the 0 setting.  We placed in 20 degrees of retroversion.  We really packed in lot of bone graft and then impacted the stem into place. The stem was very stable.  It did wind up kicking out the greater tuberosity slightly  on the proximal side to where there was a little bony ridge which I used a rongeur and rongeured that off but the tuberosity was stable and we selected the real 38+3 poly and impacted that on the humeral tray and reduced the shoulder.  We had a nice  stable shoulder, excellent range of motion, did a finger sweep on the bursal side of the cuff to make sure there were no adhesions.  We then resected the subscap remnant, irrigated thoroughly and then repaired the deltopectoral interval with 0 Vicryl  suture followed by 2-0 Vicryl for subcutaneous closure and 4-0 Monocryl for skin.  Steri-Strips applied followed by a sterile dressing.  The patient tolerated surgery well.   MUK D: 02/08/2022 5:17:23 pm T: 02/08/2022 9:52:00 pm  JOB: [id number]

## 2022-02-09 DIAGNOSIS — R110 Nausea: Secondary | ICD-10-CM | POA: Diagnosis not present

## 2022-02-09 DIAGNOSIS — T8484XA Pain due to internal orthopedic prosthetic devices, implants and grafts, initial encounter: Secondary | ICD-10-CM | POA: Diagnosis present

## 2022-02-09 DIAGNOSIS — Z96612 Presence of left artificial shoulder joint: Secondary | ICD-10-CM | POA: Diagnosis present

## 2022-02-09 DIAGNOSIS — I10 Essential (primary) hypertension: Secondary | ICD-10-CM | POA: Diagnosis present

## 2022-02-09 DIAGNOSIS — Z87891 Personal history of nicotine dependence: Secondary | ICD-10-CM | POA: Diagnosis not present

## 2022-02-09 DIAGNOSIS — Y792 Prosthetic and other implants, materials and accessory orthopedic devices associated with adverse incidents: Secondary | ICD-10-CM | POA: Diagnosis present

## 2022-02-09 DIAGNOSIS — R531 Weakness: Secondary | ICD-10-CM | POA: Diagnosis not present

## 2022-02-09 DIAGNOSIS — Z85850 Personal history of malignant neoplasm of thyroid: Secondary | ICD-10-CM | POA: Diagnosis not present

## 2022-02-09 LAB — BASIC METABOLIC PANEL
Anion gap: 9 (ref 5–15)
BUN: 18 mg/dL (ref 8–23)
CO2: 26 mmol/L (ref 22–32)
Calcium: 8.3 mg/dL — ABNORMAL LOW (ref 8.9–10.3)
Chloride: 104 mmol/L (ref 98–111)
Creatinine, Ser: 0.83 mg/dL (ref 0.44–1.00)
GFR, Estimated: >60 mL/min (ref >60)
Glucose, Bld: 140 mg/dL — ABNORMAL HIGH (ref 70–99)
Potassium: 3.6 mmol/L (ref 3.5–5.1)
Sodium: 139 mmol/L (ref 135–145)

## 2022-02-09 LAB — HEMOGLOBIN AND HEMATOCRIT, BLOOD
HCT: 36.2 % (ref 36.0–46.0)
Hemoglobin: 11.6 g/dL — ABNORMAL LOW (ref 12.0–15.0)

## 2022-02-09 NOTE — Evaluation (Signed)
Occupational Therapy Evaluation Patient Details Name: [name] MRN: [id number] DOB: 1955/05/17 Today's Date: 02/09/2022   History of Present Illness Patient is a 66 year old female who s/p left total reverse shoulder on 02/08/22 with PMH significant for OA, asthma, depression, HTN, Lt THA.   Clinical Impression   s/p shoulder replacement without functional use of right dominant upper extremity secondary to effects of surgery and interscalene block and shoulder precautions. Therapist provided education and instruction to patient  in regards to exercises, precautions, positioning, donning upper extremity clothing and bathing while maintaining shoulder precautions, ice and edema management and donning/doffing sling. Patient verbalized understanding and demonstrated as needed. Patient needed assistance to donn shirt, underwear, pants, socks and shoes and provided with instruction on compensatory strategies to perform ADLs. Patient to follow up with MD for further therapy needs.        Recommendations for follow up therapy are one component of a multi-disciplinary discharge planning process, led by the attending physician.  Recommendations may be updated based on patient status, additional functional criteria and insurance authorization.   Follow Up Recommendations  Follow physician's recommendations for discharge plan and follow up therapies     Assistance Recommended at Discharge Intermittent Supervision/Assistance  Patient can return home with the following Assistance with cooking/housework;Assist for transportation;Help with stairs or ramp for entrance;A little help with bathing/dressing/bathroom    Functional Status Assessment  Patient has had a recent decline in their functional status and demonstrates the ability to make significant improvements in function in a reasonable and predictable amount of time.  Equipment Recommendations  Other (comment) (toileting buddy)     Recommendations for Other Services       Precautions / Restrictions Precautions Precautions: Shoulder Type of Shoulder Precautions: NO ROM shoulder, OK for hand wrist and elbow Shoulder Interventions: Shoulder sling/immobilizer;At all times;Off for dressing/bathing/exercises Precaution Booklet Issued: Yes (comment) (handouts) Restrictions Weight Bearing Restrictions: Yes LUE Weight Bearing: Non weight bearing      Mobility Bed Mobility Overal bed mobility: Modified Independent             General bed mobility comments: to exit to R side of bed. patient plans to sleep on this side at home as well        Balance Overall balance assessment: No apparent balance deficits (not formally assessed)                 ADL either performed or assessed with clinical judgement   ADL           General ADL Comments: patient was able to don pants wiht min A to pull up on L side with increased time and education provided for compensatory strategies. patient was mod A to don shirt with increased time with patient able to instruct caregiver as needed during session. patient endorsed feeling ready to d/c home. patien expresed concerns over stair navigation at home. patient was educated on proper technique to navigate stairs with L hand rail with current LUE restrictions. patient verbalized understanding. patient declined to practice in gym on this date. reporting that she would be fine at home. patient was educated on where husband should be positioned to assist with stairs and importance of back up plan. patient reported she would be able to stay on 1st floor if needed at home. patient also inquired about toileting hygiene with patient reporting not feeling like she can reach the whole area. patient was able to reach poserioly for clothing management tasks earlier in  this session. patient was educated on toileting buddy and its benefits. patient verbalized understanding. patient reported she  would look into getting one.     Vision Baseline Vision/History: 1 Wears glasses              Pertinent Vitals/Pain Pain Assessment Pain Assessment: No/denies pain (block in place)     Hand Dominance Left   Extremity/Trunk Assessment Upper Extremity Assessment Upper Extremity Assessment: LUE deficits/detail LUE Deficits / Details: able to wiggle digits, reports that pinky ring and middle digits are ROM at this time block still in place   Lower Extremity Assessment Lower Extremity Assessment: Overall WFL for tasks assessed   Cervical / Trunk Assessment Cervical / Trunk Assessment: Normal   Communication     Cognition Arousal/Alertness: Awake/alert Behavior During Therapy: WFL for tasks assessed/performed Overall Cognitive Status: Within Functional Limits for tasks assessed                           Shoulder Instructions Shoulder Instructions Donning/doffing shirt without moving shoulder: Patient able to independently direct caregiver Method for sponge bathing under operated UE: Patient able to independently direct caregiver Donning/doffing sling/immobilizer: Patient able to independently direct caregiver;Minimal assistance Correct positioning of sling/immobilizer: Patient able to independently direct caregiver;Minimal assistance ROM for elbow, wrist and digits of operated UE: Modified independent (demonstrated on RUE) Sling wearing schedule (on at all times/off for ADL's): Modified independent Proper positioning of operated UE when showering: Patient able to independently direct caregiver Positioning of UE while sleeping: Patient able to independently direct caregiver    Home Living Family/patient expects to be discharged to:: Private residence Living Arrangements: Spouse/significant other Available Help at Discharge: Family;Available 24 hours/day Type of Home: House Home Access: Stairs to enter [employer] of Steps: 4 Entrance Stairs-Rails: Can  reach both              Prior Functioning/Environment Prior Level of Function : Independent/Modified Independent                OT Problem List: Decreased knowledge of precautions;Decreased activity tolerance;Decreased safety awareness;Impaired UE functional use      OT Treatment/Interventions: Self-care/ADL training;Patient/family education;Therapeutic activities    OT Goals(Current goals can be found in the care plan section) Acute Rehab OT Goals OT Goal Formulation: All assessment and education complete, DC therapy  OT Frequency:         AM-PAC OT "6 Clicks" Daily Activity     Outcome Measure Help from another person eating meals?: None Help from another person taking care of personal grooming?: None Help from another person toileting, which includes using toliet, bedpan, or urinal?: A Little Help from another person bathing (including washing, rinsing, drying)?: A Little Help from another person to put on and taking off regular upper body clothing?: A Little Help from another person to put on and taking off regular lower body clothing?: A Little 6 Click Score: 20   End of Session Equipment Utilized During Treatment: Other (comment) (sling) Nurse Communication: Other (comment) (ok to participate in therapy)  Activity Tolerance: Patient tolerated treatment well Patient left: in chair;with call bell/phone within reach  OT Visit Diagnosis: Pain Pain - Right/Left: Left Pain - part of body: Shoulder                Time: 1610-9604 OT Time Calculation (min): 43 min Charges:  OT General Charges $OT Visit: 1 Visit OT Evaluation $OT Eval Low Complexity: 1 Low OT  Treatments $Self Care/Home Management : 23-37 mins  Rennie Plowman, MS Acute Rehabilitation Department Office# [phone]   [name] 02/09/2022, 8:49 AM

## 2022-02-09 NOTE — Progress Notes (Signed)
Subjective: 1 Day Post-Op Procedure(s) (LRB): SHOULDER REVISION TO TOTAL SHOULDER ARTHROPLASTY (Left)  Patient reports pain as mild to moderate.  Denies fever, chills, N/V, CP, SOB.  Notes that she is having a difficult time eating as she is left hand dominate.  C/o of nausea and weakness.  She is working with therapy. Stresses concerns about going home today.  Patient's family on phone during encounter also expressing concerns about patient returning home today.  Objective:   VITALS:  Temp:  [97.7 F (36.5 C)-98.6 F (37 C)] 98.6 F (37 C) (12/09 0906) Pulse Rate:  [55-84] 62 (12/09 0906) Resp:  [10-19] 16 (12/09 0906) BP: (111-158)/(38-73) 114/53 (12/09 0906) SpO2:  [92 %-99 %] 96 % (12/09 0906) Weight:  [90.7 kg] 90.7 kg (12/08 1215)  General: WDWN patient in NAD. Psych:  Appropriate mood and affect. Neuro:  A&O x 3, Moving all extremities, sensation intact to light touch HEENT:  EOMs intact Chest:  Even non-labored respirations Skin:  Incision C/D/I, no rashes or lesions Extremities: warm/dry, mild edema to L shoulder, no erythema or echymosis.  No lymphadenopathy. Pulses: Radial 2+ MSK:  ROM: Full L wrist ROM, MMT: Grip strength 3/5   LABS Recent Labs    02/09/22 0316  HGB 11.6*   Recent Labs    02/09/22 0316  NA 139  K 3.6  CL 104  CO2 26  BUN 18  CREATININE 0.83  GLUCOSE 140*   No results for input(s): "LABPT", "INR" in the last 72 hours.   Assessment/Plan: 1 Day Post-Op Procedure(s) (LRB): SHOULDER REVISION TO TOTAL SHOULDER ARTHROPLASTY (Left)  Patient seen in rounds for Dr. Veverly Fells Sling to L UE when she is up and about. May take sling off when seated to rest on pillow and change clothes. Aquacel dressing applied. Disp:  Home Patient will stay one more night to allow nausea and general sense of overall weakness a chance to improve. D/C home tomorrow Plan for 2 week outpatient visit with Dr. Veverly Fells.  Mechele Claude PA-C [facility] Office:   [phone]

## 2022-02-09 NOTE — Plan of Care (Signed)
  Problem: Education: Goal: Knowledge of the prescribed therapeutic regimen will improve Outcome: Progressing   Problem: Activity: Goal: Ability to tolerate increased activity will improve Outcome: Progressing   Problem: Pain Management: Goal: Pain level will decrease with appropriate interventions Outcome: Progressing

## 2022-02-10 NOTE — Progress Notes (Signed)
Occupational Therapy Treatment Patient Details Name: [name] MRN: [id number] DOB: Apr 14, 1955 Today's Date: 02/10/2022   History of present illness Patient is a 66 year old female who s/p left total reverse shoulder on 02/08/22 with PMH significant for OA, asthma, depression, HTN, Lt THA.   OT comments  s/p shoulder replacement without functional use of left  upper extremity secondary to effects of surgery and interscalene block and shoulder precautions. Therapist provided education and instruction to patient and spouse in regards to exercises, precautions, positioning, donning upper extremity clothing and bathing while maintaining shoulder precautions, ice and edema management and donning/doffing sling. Patient and spouse verbalized understanding and demonstrated as needed. Patient needed assistance to donn shirt, underwear, pants, socks and shoes and provided with instruction on compensatory strategies to perform ADLs. Patient to follow up with MD for further therapy needs.     Recommendations for follow up therapy are one component of a multi-disciplinary discharge planning process, led by the attending physician.  Recommendations may be updated based on patient status, additional functional criteria and insurance authorization.    Follow Up Recommendations  Follow physician's recommendations for discharge plan and follow up therapies     Assistance Recommended at Discharge Intermittent Supervision/Assistance  Patient can return home with the following  Assistance with cooking/housework;Assist for transportation;Help with stairs or ramp for entrance;A little help with bathing/dressing/bathroom         Precautions / Restrictions Precautions Precautions: Shoulder Type of Shoulder Precautions: NO ROM shoulder, OK for hand wrist and elbow Shoulder Interventions: Shoulder sling/immobilizer;At all times;Off for dressing/bathing/exercises Precaution Booklet Issued: Yes  (comment) Restrictions Weight Bearing Restrictions: Yes LUE Weight Bearing: Non weight bearing              ADL either performed or assessed with clinical judgement     Cognition Arousal/Alertness: Awake/alert Behavior During Therapy: WFL for tasks assessed/performed Overall Cognitive Status: Within Functional Limits for tasks assessed   General Comments: husband present for education session as well.           Shoulder Instructions Shoulder Instructions Donning/doffing shirt without moving shoulder: Caregiver independent with task Method for sponge bathing under operated UE: Caregiver independent with task;Patient able to independently direct caregiver Donning/doffing sling/immobilizer: Caregiver independent with task;Patient able to independently direct caregiver Correct positioning of sling/immobilizer: Patient able to independently direct caregiver;Caregiver independent with task ROM for elbow, wrist and digits of operated UE: Modified independent Sling wearing schedule (on at all times/off for ADL's): Modified independent Proper positioning of operated UE when showering: Patient able to independently direct caregiver Positioning of UE while sleeping: Patient able to independently direct caregiver     General Comments      Pertinent Vitals/ Pain       Pain Assessment Pain Assessment: Faces Faces Pain Scale: Hurts a little bit Pain Location: L shoulder Pain Descriptors / Indicators: Discomfort Pain Intervention(s): Monitored during session      Progress Toward Goals  OT Goals(current goals can now be found in the care plan section)     Acute Rehab OT Goals OT Goal Formulation: All assessment and education complete, DC therapy  Plan Discharge plan remains appropriate       AM-PAC OT "6 Clicks" Daily Activity     Outcome Measure   Help from another person eating meals?: None Help from another person taking care of personal grooming?: None Help from  another person toileting, which includes using toliet, bedpan, or urinal?: A Little Help from another person bathing (including  washing, rinsing, drying)?: A Little Help from another person to put on and taking off regular upper body clothing?: A Little Help from another person to put on and taking off regular lower body clothing?: A Little 6 Click Score: 20    End of Session Equipment Utilized During Treatment: Other (comment) (sling)  OT Visit Diagnosis: Pain Pain - Right/Left: Left Pain - part of body: Shoulder   Activity Tolerance Patient tolerated treatment well   Patient Left in chair;with call bell/phone within reach;with family/visitor present   Nurse Communication Other (comment) (d/c recommendations)        Time: 1028-9022 OT Time Calculation (min): 14 min  Charges: OT General Charges $OT Visit: 1 Visit OT Treatments $Self Care/Home Management : 8-22 mins  Rennie Plowman, MS Acute Rehabilitation Department Office# [phone]   Willa Rough 02/10/2022, 9:07 AM

## 2022-02-10 NOTE — Progress Notes (Signed)
Patient ready for d/c at 0955 but orders for d/c not in system. Informed patient of delay. At 1030 patient and husband stated they were ready to d/c AMA if needed, that she did not want to wait anymore. PIV d/c'd, reviewed all d/c instructions including therapy, dressing care, follow up. Reviewed aloud all of Dr Gilberto Better instructions. Educated patient that waiting for written d/c instructions and orders were best. Patient stated that she had access to MyChart and she could look up all the information there.  Patient entering elevators w/ husband when nurse noted d/c orders in. Patient and husband had verbalized they had all the information they needed and verbalized understanding they would contact Dr Norris's office with any questions or concerns.

## 2022-02-10 NOTE — Progress Notes (Signed)
   Subjective: 2 Days Post-Op Procedure(s) (LRB): SHOULDER REVISION TO TOTAL SHOULDER ARTHROPLASTY (Left) Patient reports pain as mild.   Patient seen in rounds for Dr. Veverly Fells. Patient is well, and has had no acute complaints or problems. No acute events overnight. Voiding without difficulty, positive flatus. Patient states she is ready to get home today. We will continue therapy today.   Objective: Vital signs in last 24 hours: Temp:  [98.3 F (36.8 C)-98.5 F (36.9 C)] 98.3 F (36.8 C) (12/10 0605) Pulse Rate:  [61-76] 61 (12/10 0605) Resp:  [16-18] 18 (12/10 0605) BP: (106-147)/(48-83) 131/56 (12/10 0605) SpO2:  [94 %-98 %] 98 % (12/10 0605)  Intake/Output from previous day:  Intake/Output Summary (Last 24 hours) at 02/10/2022 0926 Last data filed at 02/10/2022 0808 Gross per 24 hour  Intake 940.14 ml  Output --  Net 940.14 ml     Intake/Output this shift: No intake/output data recorded.  Labs: Recent Labs    02/09/22 0316  HGB 11.6*   Recent Labs    02/09/22 0316  HCT 36.2   Recent Labs    02/09/22 0316  NA 139  K 3.6  CL 104  CO2 26  BUN 18  CREATININE 0.83  GLUCOSE 140*  CALCIUM 8.3*   No results for input(s): "LABPT", "INR" in the last 72 hours.  Exam: General - Patient is Alert and Oriented Extremity - Neurologically intact Sensation intact distally Intact pulses distally  Dressing - dressing C/D/I Motor Function - intact, moving fingers and wrist without difficulty.  Past Medical History:  Diagnosis Date   Anemia    Anxiety    Arthritis    Asthma    Cancer ([place]) 2023   Thyroid   Depression    Dyspnea    GERD (gastroesophageal reflux disease)    Goiter    History of hiatal hernia    Hypertension    Hyperthyroidism     Assessment/Plan: 2 Days Post-Op Procedure(s) (LRB): SHOULDER REVISION TO TOTAL SHOULDER ARTHROPLASTY (Left) Principal Problem:   H/O total shoulder replacement, left  Estimated body mass index is 33.8  kg/m as calculated from the following:   Height as of this encounter: 5' 4.5" (1.638 m).   Weight as of this encounter: 90.7 kg. Advance diet Up with therapy D/C IV fluids   Patient seen in rounds for Dr. Veverly Fells Sling to L UE when she is up and about. May take sling off when seated to rest on pillow and change clothes. Aquacel dressing in place Disp:  Home D/C home today Plan for 2 week outpatient visit with Dr. Veverly Fells.  Griffith Citron, PA-C Orthopedic Surgery [phone] 02/10/2022, 9:26 AM

## 2022-02-11 DIAGNOSIS — M25512 Pain in left shoulder: Secondary | ICD-10-CM | POA: Diagnosis not present

## 2022-02-11 DIAGNOSIS — W19XXXA Unspecified fall, initial encounter: Secondary | ICD-10-CM | POA: Diagnosis not present

## 2022-02-11 DIAGNOSIS — S42255A Nondisplaced fracture of greater tuberosity of left humerus, initial encounter for closed fracture: Secondary | ICD-10-CM | POA: Diagnosis not present

## 2022-02-11 DIAGNOSIS — S52125A Nondisplaced fracture of head of left radius, initial encounter for closed fracture: Secondary | ICD-10-CM | POA: Diagnosis not present

## 2022-02-11 NOTE — Anesthesia Postprocedure Evaluation (Signed)
Anesthesia Post Note  Patient: [name]  Procedure(s) Performed: SHOULDER REVISION TO TOTAL SHOULDER ARTHROPLASTY (Left: Shoulder)     Patient location during evaluation: PACU Anesthesia Type: Regional and General Level of consciousness: awake and alert Pain management: pain level controlled Vital Signs Assessment: post-procedure vital signs reviewed and stable Respiratory status: spontaneous breathing, nonlabored ventilation, respiratory function stable and patient connected to nasal cannula oxygen Cardiovascular status: blood pressure returned to baseline and stable Postop Assessment: no apparent nausea or vomiting Anesthetic complications: no  No notable events documented.  Last Vitals:  Vitals:   02/09/22 2122 02/10/22 0605  BP: (!) 108/48 (!) 131/56  Pulse: 69 61  Resp: 16 18  Temp: 36.9 C 36.8 C  SpO2: 94% 98%    Last Pain:  Vitals:   02/10/22 1000  TempSrc:   PainSc: 3                  Barnet Glasgow

## 2022-02-12 NOTE — Discharge Summary (Signed)
In most cases prophylactic antibiotics for Dental procdeures after total joint surgery are not necessary.  Exceptions are as follows:  1. History of prior total joint infection  2. Severely immunocompromised (Organ Transplant, cancer chemotherapy, Rheumatoid biologic meds such as [place])  3. Poorly controlled diabetes (A1C &gt; 8.0, blood glucose over 200)  If you have one of these conditions, contact your surgeon for an antibiotic prescription, prior to your dental procedure. Orthopedic Discharge Summary        Physician Discharge Summary  Patient ID: [name] MRN: [id number] DOB/AGE: August 20, 1955 66 y.o.  Admit date: 02/08/2022 Discharge date: 02/12/2022   Procedures:  Procedure(s) (LRB): SHOULDER REVISION TO TOTAL SHOULDER ARTHROPLASTY (Left)  Attending Physician:  Dr. Esmond Plants  Admission Diagnoses:   left shoulder cuff arthropathy  Discharge Diagnoses:  left shoulder cuff arthropathy   Past Medical History:  Diagnosis Date   Anemia    Anxiety    Arthritis    Asthma    Cancer ([place]) 2023   Thyroid   Depression    Dyspnea    GERD (gastroesophageal reflux disease)    Goiter    History of hiatal hernia    Hypertension    Hyperthyroidism     PCP: Orma Render, NP   Discharged Condition: good  Hospital Course:  Patient underwent the above stated procedure on 02/08/2022. Patient tolerated the procedure well and brought to the recovery room in good condition and subsequently to the floor. Patient had an uncomplicated hospital course and was stable for discharge.   Disposition: Discharge disposition: 01-Home or Self Care      with follow up in 2 weeks    Follow-up Information     Netta Cedars, MD. Call in 2 week(s).   Specialty: Orthopedic Surgery Why: call [phone] for appt Contact information: [address] [place]  [address] [phone]                 Dental Antibiotics:  In most cases  prophylactic antibiotics for Dental procdeures after total joint surgery are not necessary.  Exceptions are as follows:  1. History of prior total joint infection  2. Severely immunocompromised (Organ Transplant, cancer chemotherapy, Rheumatoid biologic meds such as [place])  3. Poorly controlled diabetes (A1C &gt; 8.0, blood glucose over 200)  If you have one of these conditions, contact your surgeon for an antibiotic prescription, prior to your dental procedure.    Allergies as of 02/10/2022       Reactions   Mango Flavor [flavoring Agent] Anaphylaxis   Specifically mango peel , patient can eat the fruit itself   Oxycontin [oxycodone]    Dizziness        Medication List     TAKE these medications    albuterol 108 (90 Base) MCG/ACT inhaler Commonly known as: VENTOLIN HFA Inhale 2 puffs into the lungs every 6 (six) hours as needed for wheezing or shortness of breath.   ALPRAZolam 0.25 MG tablet Commonly known as: XANAX Take 1/2 tab to 1 tab as needed for panic/anxiety up to two times in a day.   b complex vitamins capsule Take 1 capsule by mouth daily.   Breztri Aerosphere 160-9-4.8 MCG/ACT Aero Generic drug: Budeson-Glycopyrrol-Formoterol Inhale 2 puffs into the lungs in the morning and at bedtime.   busPIRone 7.5 MG tablet Commonly known as: BUSPAR Take 1 tablet (7.5 mg total) by mouth 2 (two) times daily.   cetirizine 10 MG tablet Commonly known as: ZYRTEC TAKE  ONE TABLET BY MOUTH TWICE A DAY What changed:  when to take this reasons to take this   fluticasone 50 MCG/ACT nasal spray Commonly known as: FLONASE Place 1 spray into both nostrils daily.   hydrochlorothiazide 25 MG tablet Commonly known as: HYDRODIURIL TAKE ONE TABLET BY MOUTH EVERY MORNING   HYDROcodone-acetaminophen 5-325 MG tablet Commonly known as: Norco Take 1-2 tablets by mouth every 6 (six) hours as needed for moderate pain or severe pain.   levothyroxine 175 MCG  tablet Commonly known as: SYNTHROID Take 1 tablet (175 mcg total) by mouth daily.   magnesium oxide 400 MG tablet Commonly known as: MAG-OX Take 400 mg by mouth daily.   methocarbamol 500 MG tablet Commonly known as: ROBAXIN Take 1 tablet (500 mg total) by mouth every 8 (eight) hours as needed for muscle spasms.   ondansetron 4 MG tablet Commonly known as: Zofran Take 1 tablet (4 mg total) by mouth every 8 (eight) hours as needed for nausea, vomiting or refractory nausea / vomiting.   sertraline 100 MG tablet Commonly known as: ZOLOFT TAKE 1.5 TABLETS BY MOUTH EVERY MORNING   SOLARCAINE EX Apply 1 application. topically daily as needed (bug bites).   vitamin C 1000 MG tablet Take 1,000 mg by mouth daily.   Vitamin D 125 MCG (5000 UT) Caps Take 5,000 Units by mouth daily.          Signed: Ventura Bruns 02/12/2022, 7:57 AM  [facility] is now [facility] [address], [place], [place], [place] [address] Phone: [place]

## 2022-02-13 ENCOUNTER — Telehealth: Payer: Self-pay

## 2022-02-13 ENCOUNTER — Encounter (HOSPITAL_COMMUNITY): Payer: Self-pay | Admitting: Orthopedic Surgery

## 2022-02-13 NOTE — Patient Outreach (Signed)
  Care Coordination TOC Note Transition Care Management Follow-up Telephone Call Date of discharge and from where: 02/10/22- [facility]  Dx: "shoulder revision" How have you been since you were released from the hospital? Patient states "not good." She voices that she "is stubborn independent old woman who does not like to ask for help." She shares that she fell a few days ago on her surgical arm.She went to see surgeon and had x-rays done that showed she has two fractures to affected arm. They are monitoring area and she goes back next week to see if she will have to have shoulder surgery again. Patient does state that "pain diminishing and much better." Appetite fair. She has not had a BM since coming home. She took some Miralax yesterday and again today. She is currently drinking coffee. Provided some non-pharmacological measures. She is aware to follow up with surgeon if she does not have BM by this afternoon. Any questions or concerns? No  Items Reviewed: Did the pt receive and understand the discharge instructions provided? No Patient states she never received paperwork at time of discharge.Patient viewed MyChart while on call with RN CM  Medications obtained and verified? Yes  Other? Yes  Any new allergies since your discharge? No  Dietary orders reviewed? Yes Do you have support at home? Yes   Home Care and Equipment/Supplies: Were home health services ordered? not applicable If so, what is the name of the agency? N/A  Has the agency set up a time to come to the patient's home? not applicable Were any new equipment or medical supplies ordered?  No What is the name of the medical supply agency? N/A Were you able to get the supplies/equipment? not applicable Do you have any questions related to the use of the equipment or supplies? No  Functional Questionnaire: (I = Independent and D = Dependent) ADLs: A  Bathing/Dressing- A  Meal Prep- A  Eating- I  Maintaining  continence- A  Transferring/Ambulation- A  Managing Meds- I  Follow up appointments reviewed:  PCP Hospital f/u appt confirmed? No . [facility] f/u appt confirmed? Yes  Patient saw surgeon this week and will see again on 02/20/22-8:45am. Are transportation arrangements needed? No  If their condition worsens, is the pt aware to call PCP or go to the Emergency Dept.? Yes Was the patient provided with contact information for the PCP's office or ED? Yes Was to pt encouraged to call back with questions or concerns? Yes  SDOH assessments and interventions completed:   Yes SDOH Interventions Today    Flowsheet Row Most Recent Value  SDOH Interventions   Food Insecurity Interventions Intervention Not Indicated  Transportation Interventions Intervention Not Indicated       Care Coordination Interventions:  Education provided    Encounter Outcome:  Pt. Visit Completed    Enzo Montgomery, RN,BSN,CCM [place] Management Telephonic Care Management Coordinator Direct Phone: [phone] Toll Free: [phone] Fax: [phone]

## 2022-03-05 ENCOUNTER — Ambulatory Visit
Admission: RE | Admit: 2022-03-05 | Discharge: 2022-03-05 | Disposition: A | Discharge status: Home / Self Care | Payer: Medicare PPO | Source: Ambulatory Visit | Attending: Nurse Practitioner | Admitting: Nurse Practitioner

## 2022-03-05 DIAGNOSIS — Z1231 Encounter for screening mammogram for malignant neoplasm of breast: Secondary | ICD-10-CM | POA: Diagnosis not present

## 2022-03-12 ENCOUNTER — Other Ambulatory Visit (HOSPITAL_BASED_OUTPATIENT_CLINIC_OR_DEPARTMENT_OTHER): Payer: Self-pay | Admitting: Nurse Practitioner

## 2022-03-12 DIAGNOSIS — F413 Other mixed anxiety disorders: Secondary | ICD-10-CM

## 2022-03-18 DIAGNOSIS — H43393 Other vitreous opacities, bilateral: Secondary | ICD-10-CM | POA: Diagnosis not present

## 2022-03-19 NOTE — Telephone Encounter (Signed)
Pt called and scheduled a fu

## 2022-03-20 DIAGNOSIS — S42252D Displaced fracture of greater tuberosity of left humerus, subsequent encounter for fracture with routine healing: Secondary | ICD-10-CM | POA: Diagnosis not present

## 2022-03-20 DIAGNOSIS — Z4789 Encounter for other orthopedic aftercare: Secondary | ICD-10-CM | POA: Diagnosis not present

## 2022-03-20 DIAGNOSIS — S52122D Displaced fracture of head of left radius, subsequent encounter for closed fracture with routine healing: Secondary | ICD-10-CM | POA: Diagnosis not present

## 2022-03-28 ENCOUNTER — Encounter: Payer: Self-pay | Admitting: Nurse Practitioner

## 2022-03-28 ENCOUNTER — Ambulatory Visit: Payer: Medicare PPO | Admitting: Nurse Practitioner

## 2022-03-28 VITALS — BP 126/78 | HR 76 | Temp 98.2°F | Wt 199.2 lb

## 2022-03-28 DIAGNOSIS — J45909 Unspecified asthma, uncomplicated: Secondary | ICD-10-CM

## 2022-03-28 DIAGNOSIS — I10 Essential (primary) hypertension: Secondary | ICD-10-CM | POA: Diagnosis not present

## 2022-03-28 DIAGNOSIS — Z96612 Presence of left artificial shoulder joint: Secondary | ICD-10-CM | POA: Diagnosis not present

## 2022-03-28 DIAGNOSIS — F413 Other mixed anxiety disorders: Secondary | ICD-10-CM

## 2022-03-28 DIAGNOSIS — J4540 Moderate persistent asthma, uncomplicated: Secondary | ICD-10-CM

## 2022-03-28 MED ORDER — BUSPIRONE HCL 7.5 MG PO TABS: 7.5000 mg | ORAL_TABLET | Freq: Two times a day (BID) | ORAL | 3 refills | Status: DC

## 2022-03-28 MED ORDER — CETIRIZINE HCL 10 MG PO TABS: 10.0000 mg | ORAL_TABLET | Freq: Two times a day (BID) | ORAL | 3 refills | Status: DC

## 2022-03-28 MED ORDER — SERTRALINE HCL 100 MG PO TABS: ORAL_TABLET | ORAL | 3 refills | Status: DC

## 2022-03-28 MED ORDER — HYDROCHLOROTHIAZIDE 25 MG PO TABS: 25.0000 mg | ORAL_TABLET | Freq: Every morning | ORAL | 3 refills | Status: DC

## 2022-03-28 NOTE — Progress Notes (Signed)
Worthy Keeler, DNP, AGNP-c [facility]  [address] [place], [place] [address] [phone]  ESTABLISHED PATIENT- Chronic Health and/or Follow-Up Visit  Blood pressure 126/78, pulse 76, temperature 98.2 F (36.8 C), weight 199 lb 3.2 oz (90.4 kg), last menstrual period 08/08/2010.    [name] is a 67 y.o. year old female presenting today for evaluation and management of the following: Stress She retired at the end of December and she tells me that she finds that her stress level is virtually gone. She tells me she is enjoying this phase of life. She is managing well. At this time she wishes to continue on her current medications.   Fall In December she tripped over a dog crate and fell and fractured her arm. She has had surgery and tells me she is doing well and recovering slowly.   HTN Her blood pressure is reportedly well controlled. She is not having headaches, CP, Palpitations, ShOB, LE edema. She is taking her medication without any side effects. She is in need of refills today.   All ROS negative with exception of what is listed above.   PHYSICAL EXAM Physical Exam Vitals and nursing note reviewed.  Constitutional:      General: She is not in acute distress.    Appearance: Normal appearance.  HENT:     Head: Normocephalic.  Eyes:     Extraocular Movements: Extraocular movements intact.     Conjunctiva/sclera: Conjunctivae normal.     Pupils: Pupils are equal, round, and reactive to light.  Neck:     Vascular: No carotid bruit.  Cardiovascular:     Rate and Rhythm: Normal rate and regular rhythm.     Pulses: Normal pulses.     Heart sounds: Normal heart sounds. No murmur heard. Pulmonary:     Effort: Pulmonary effort is normal.     Breath sounds: Normal breath sounds. No wheezing.  Abdominal:     General: Bowel sounds are normal. There is no distension.     Palpations: Abdomen is soft.     Tenderness: There is no abdominal tenderness.  There is no guarding.  Musculoskeletal:        General: Normal range of motion.     Cervical back: Normal range of motion and neck supple.     Right lower leg: No edema.     Left lower leg: No edema.  Lymphadenopathy:     Cervical: No cervical adenopathy.  Skin:    General: Skin is warm and dry.     Capillary Refill: Capillary refill takes less than 2 seconds.  Neurological:     General: No focal deficit present.     Mental Status: She is alert and oriented to person, place, and time.  Psychiatric:        Mood and Affect: Mood normal.        Behavior: Behavior normal.        Thought Content: Thought content normal.        Judgment: Judgment normal.     PLAN Problem List Items Addressed This Visit     Other mixed anxiety disorders    Very well controlled at this time. She is much less stressed since retiring, which is wonderful! We will continue her current medications at this time. Refills provided today. No alarm symptoms present.  Recommendations If you notice that your mood starts to decline, please let me know and we can make changes to your medication.  Congratulations on retirement!  Relevant Medications   sertraline (ZOLOFT) 100 MG tablet   busPIRone (BUSPAR) 7.5 MG tablet   Asthma due to environmental allergies - Primary    Well controlled at this time. She had some issues in October when the pollen was high, but this improved quickly. No changes at this time. Continue with allergen control      Relevant Medications   cetirizine (ZYRTEC) 10 MG tablet   H/O total shoulder replacement, left    She is doing very well following surgery on her shoulder after her fall. No residual concerns at this time.       Primary hypertension    Chronic. Blood pressures are well controlled on HCTZ with no concerns at this time. We will continue current medications. Goal BP <130/80. Recommend: Follow a low fat, low sodium diet and work to get exercise every day.         Relevant Medications   hydrochlorothiazide (HYDRODIURIL) 25 MG tablet    Return in about 7 months (around 10/27/2022) for CPE.   Worthy Keeler, DNP, AGNP-c 03/28/2022 11:31 AM

## 2022-03-28 NOTE — Assessment & Plan Note (Addendum)
Well controlled at this time. She had some issues in October when the pollen was high, but this improved quickly. No changes at this time. Continue with allergen control

## 2022-03-28 NOTE — Patient Instructions (Signed)
I have sent the refills of your medications and sent 90 day supplies for you.   You look great today! I am so glad you are feeling well.

## 2022-04-07 ENCOUNTER — Encounter: Payer: Self-pay | Admitting: Nurse Practitioner

## 2022-04-07 DIAGNOSIS — I10 Essential (primary) hypertension: Secondary | ICD-10-CM | POA: Insufficient documentation

## 2022-04-07 NOTE — Assessment & Plan Note (Signed)
She is doing very well following surgery on her shoulder after her fall. No residual concerns at this time.

## 2022-04-07 NOTE — Assessment & Plan Note (Signed)
Very well controlled at this time. She is much less stressed since retiring, which is wonderful! We will continue her current medications at this time. Refills provided today. No alarm symptoms present.  Recommendations If you notice that your mood starts to decline, please let me know and we can make changes to your medication.  Congratulations on retirement!

## 2022-04-07 NOTE — Assessment & Plan Note (Signed)
Chronic. Blood pressures are well controlled on HCTZ with no concerns at this time. We will continue current medications. Goal BP <130/80. Recommend: Follow a low fat, low sodium diet and work to get exercise every day.

## 2022-05-01 DIAGNOSIS — S42252D Displaced fracture of greater tuberosity of left humerus, subsequent encounter for fracture with routine healing: Secondary | ICD-10-CM | POA: Diagnosis not present

## 2022-05-01 DIAGNOSIS — Z4789 Encounter for other orthopedic aftercare: Secondary | ICD-10-CM | POA: Diagnosis not present

## 2022-05-01 DIAGNOSIS — S52122D Displaced fracture of head of left radius, subsequent encounter for closed fracture with routine healing: Secondary | ICD-10-CM | POA: Diagnosis not present

## 2022-05-14 ENCOUNTER — Other Ambulatory Visit: Payer: Self-pay | Admitting: Internal Medicine

## 2022-05-14 DIAGNOSIS — C73 Malignant neoplasm of thyroid gland: Secondary | ICD-10-CM | POA: Diagnosis not present

## 2022-05-15 ENCOUNTER — Other Ambulatory Visit: Payer: Self-pay | Admitting: Internal Medicine

## 2022-05-15 ENCOUNTER — Other Ambulatory Visit: Payer: Self-pay | Admitting: Student

## 2022-05-15 ENCOUNTER — Encounter: Payer: Self-pay | Admitting: Internal Medicine

## 2022-05-15 LAB — T4F: T4,Free (Direct): 1.34 ng/dL (ref 0.82–1.77)

## 2022-05-15 LAB — TSH RFX ON ABNORMAL TO FREE T4: TSH: 7.3 u[IU]/mL — ABNORMAL HIGH (ref 0.450–4.500)

## 2022-05-15 MED ORDER — LEVOTHYROXINE SODIUM 200 MCG PO TABS: 200.0000 ug | ORAL_TABLET | Freq: Every day | ORAL | 3 refills | Status: DC

## 2022-07-17 ENCOUNTER — Ambulatory Visit: Payer: Medicare PPO | Admitting: Internal Medicine

## 2022-07-17 ENCOUNTER — Encounter: Payer: Self-pay | Admitting: Internal Medicine

## 2022-07-17 VITALS — BP 116/74 | HR 65 | Ht 64.5 in | Wt 195.6 lb

## 2022-07-17 DIAGNOSIS — Z85850 Personal history of malignant neoplasm of thyroid: Secondary | ICD-10-CM

## 2022-07-17 DIAGNOSIS — E890 Postprocedural hypothyroidism: Secondary | ICD-10-CM | POA: Diagnosis not present

## 2022-07-17 LAB — TSH: TSH: 0.03 u[IU]/mL — ABNORMAL LOW (ref 0.35–5.50)

## 2022-07-17 NOTE — Progress Notes (Unsigned)
Name: [name]  MRN/ DOB: [id number], [birth year]    Age/ Sex: 67 y.o., female    PCP: Early, Sung Amabile, NP   Reason for Endocrinology Evaluation: MNG     Date of Initial Endocrinology Evaluation: 07/18/2022     HPI: [name] is a 67 y.o. female with a past medical history of Asthma, MNG. The patient presented for initial endocrinology clinic visit on 07/18/2022 for consultative assistance with her MNG.    During evaluation for chronic anemia she was seen by GI and was noted to have a hiatal hernia.  A CT scan was ordered for further evaluation of hiatal hernia and the patient had an incidental 2.2 cm left thyroid nodule, which prompted a thyroid ultrasound showing multinodular goiter.  Per patient she has been diagnosed with multinodular goiter in 2002 She was on LT-for replacement for many years before this was stopped by a physician    She is S/P FNA of the right mid 4.5 cm nodule with a cytology report consistent with suspicious for malignancy ( [place]hesda Category V) . 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  09/2021: Tg 0.1 (TgAb <1) 01/2022: Tg 0.2 (TgAB) < 1) 01/2022: [place] right resection bed hypoechoic 5 x 6 x 5 mm, left resection bed 12 x 5 x 5 mm    Switched to Levothyroxine at night, as that has worked better for her    SUBJECTIVE:   Today (07/18/22):  [name] is here for a follow up on PTC.    She is S/P total thyroidectomy 07/05/2021 She has been noted with weight loss  Denies local neck swelling , has noted hot flashes in the morning, but she does not think that this is related to menopause  Denies constipation or diarrhea  Denies palpitations  Denies tremors  No biotin   She is s/p revision to total shoulder arthroplasty 02/2022, continues to follow-up  with Ortho   Levothyroxine 200 mg   HISTORY:  Past Medical History:  Past Medical History:  Diagnosis Date   Anemia    Anxiety    Arthritis    Asthma    Cancer (HCC) 2023   Thyroid   Depression    Dyspnea    GERD (gastroesophageal reflux disease)    Goiter    History of hiatal hernia    Hypertension    Hyperthyroidism    Pain in joint of left shoulder 12/11/2021   Past Surgical History:  Past Surgical History:  Procedure Laterality Date   CHOLECYSTECTOMY     FOOT SURGERY     HAMMER TOE SURGERY Bilateral    All toes on both feet   KNEE SURGERY Left    TKA x2    1974, 2014   SHOULDER SURGERY Bilateral    Left Hemiarthroplasty,  total arthroplasty on Right shoulder   THYROIDECTOMY N/A 07/05/2021   Procedure: TOTAL THYROIDECTOMY;  Surgeon: Rodman Pickle, MD;  Location: [facility];  Service: General;  Laterality: N/A;   TOTAL HIP ARTHROPLASTY Left    TOTAL KNEE ARTHROPLASTY Right 11/28/2020   Procedure: TOTAL KNEE ARTHROPLASTY;  Surgeon: Durene Romans, MD;  Location: [facility];  Service: Orthopedics;  Laterality: Right;   TOTAL SHOULDER ARTHROPLASTY Left 02/08/2022   Procedure: SHOULDER REVISION  TO TOTAL SHOULDER ARTHROPLASTY;  Surgeon: Beverely Low, MD;  Location: [facility];  Service: Orthopedics;  Laterality: Left;  120 MIN CHOICE WITH INTERSCALENE BLOCK   WISDOM TOOTH EXTRACTION     XI ROBOTIC ASSISTED HIATAL HERNIA REPAIR N/A 08/20/2021   Procedure: XI ROBOTIC ASSISTED HIATAL HERNIA REPAIR WITH PARTIAL FUNDOPLICATION AND EGD;  Surgeon: [name], De Blanch, MD;  Location: [facility];  Service: General;  Laterality: N/A;    Social History:  reports that she quit smoking about 50 years ago. Her smoking use included cigarettes. She has a 4.00 pack-year smoking history. She has never used smokeless tobacco. She reports current alcohol use. She reports that she does not use drugs. Family History: family history includes Breast cancer in her maternal grandmother; Emphysema in her  paternal grandmother; Heart disease in her mother.   HOME MEDICATIONS: Allergies as of 07/17/2022       Reactions   Mango Flavor [flavoring Agent] Anaphylaxis   Specifically mango peel , patient can eat the fruit itself   Oxycontin [oxycodone]    Dizziness        Medication List        Accurate as of Jul 17, 2022 11:59 PM. If you have any questions, ask your nurse or doctor.          STOP taking these medications    methocarbamol 500 MG tablet Commonly known as: ROBAXIN Stopped by: Scarlette Shorts, MD       TAKE these medications    albuterol 108 (90 Base) MCG/ACT inhaler Commonly known as: VENTOLIN HFA Inhale 2 puffs into the lungs every 6 (six) hours as needed for wheezing or shortness of breath.   ALPRAZolam 0.25 MG tablet Commonly known as: XANAX Take 1/2 tab to 1 tab as needed for panic/anxiety up to two times in a day.   b complex vitamins capsule Take 1 capsule by mouth daily.   Breztri Aerosphere 160-9-4.8 MCG/ACT Aero Generic drug: Budeson-Glycopyrrol-Formoterol INHALE TWO PUFFS BY MOUTH EVERY MORNING AND INHALE TWO PUFFS BY MOUTH EVERY NIGHT AT BEDTIME   busPIRone 7.5 MG tablet Commonly known as: BUSPAR Take 1 tablet (7.5 mg total) by mouth 2 (two) times daily.   cetirizine 10 MG tablet Commonly known as: ZYRTEC Take 1 tablet (10 mg total) by mouth 2 (two) times daily.   fluticasone 50 MCG/ACT nasal spray Commonly known as: FLONASE Place 1 spray into both nostrils daily.   hydrochlorothiazide 25 MG tablet Commonly known as: HYDRODIURIL Take 1 tablet (25 mg total) by mouth every morning.   levothyroxine 200 MCG tablet Commonly known as: SYNTHROID Take 1 tablet (200 mcg total) by mouth daily.   magnesium oxide 400 MG tablet Commonly known as: MAG-OX Take 400 mg by mouth daily.   sertraline 100 MG tablet Commonly known as: ZOLOFT TAKE 1.5 TABLETS BY MOUTH EVERY MORNING   SOLARCAINE EX Apply 1 application. topically daily as  needed (bug bites).   vitamin C 1000 MG tablet Take 1,000 mg by mouth daily.   Vitamin D 125 MCG (5000 UT) Caps Take 5,000 Units by mouth daily.          REVIEW OF SYSTEMS: A comprehensive ROS was conducted with the patient and is negative except as per HPI    OBJECTIVE:  VS:BP 116/74 (BP Location: Left Arm, Patient Position: Sitting, Cuff Size: Large)   Pulse 65   Ht 5' 4.5" (1.638 m)   Wt 195 lb 9.6 oz (88.7 kg)   LMP 08/08/2010  SpO2 98%   BMI 33.06 kg/m    Wt Readings from Last 3 Encounters:  07/17/22 195 lb 9.6 oz (88.7 kg)  03/28/22 199 lb 3.2 oz (90.4 kg)  02/08/22 200 lb (90.7 kg)     EXAM: General: Pt appears well and is in NAD  Neck: General: Supple without adenopathy. Thyroid: no nodules appreciated   Lungs: Clear with good BS bilat   Heart: Auscultation: RRR.  Abdomen: soft, nontender  Extremities:  BL LE: No pretibial edema   Mental Status: Judgment, insight: Intact Orientation: Oriented to time, place, and person Mood and affect: No depression, anxiety, or agitation     DATA REVIEWED:   Latest Reference Range & Units 07/17/22 07:56  TSH 0.35 - 5.50 uIU/mL 0.03 (L)  (L): Data is abnormally low    SURGICAL PATHOLOGY 07/05/221  A. THYROID, TOTAL THYROIDECTOMY:  Papillary carcinoma, oncocytic variant, of right lobe (see comment)  Tumor measures 3.8 x 3.1 x 2.1 cm  Margins free  Background of multinodular goiter with dominant nodule within the left  lobe   Comment:  Sections of the right thyroid show a lobulated circumscribed thinly  encapsulated nodule composed of variably sized follicular groups showing  follicular cells with abundant granular cytoplasm all within a fibrotic  focally hyalinized fibrovascular stroma.  These cells show a variable  but moderate nuclear enlargement and cytologic atypia.  Many of the  cells show enlarged nuclei with clearing and margination of the nuclear  chromatin and scattered small inconspicuous  nucleoli.  Although no  overlap is seen, the cells show frequent nuclear grooves, and nuclear  pseudoinclusions are readily identified.  There is scant associated  colloid.  Scattered calcifications and psammoma bodies are seen.  These  findings are consistent with a papillary carcinoma with oncocytic  features.   ONCOLOGY TABLE   THYROID GLAND, CARCINOMA: Resection   Procedure: Total thyroidectomy  Tumor Focality: Unifocal  Tumor Site: Right lobe  Tumor Size: 3.8 x 3.1 x 2.1 cm  Histologic Type: Papillary carcinoma, oncocytic variant  Angioinvasion: Not identified  Lymphatic Invasion: Not identified  Extrathyroidal Extension: Not identified  Margin Status: All margins negative for invasive carcinoma  Regional Lymph Node Status: Not applicable (no regional lymph nodes  submitted or found)  Distant Metastasis: Not applicable  Pathologic Stage Classification (pTNM, AJCC 8th Edition): pT2, pN n/a    Thyroid bed ultrasound 01/16/2022   FINDINGS: The thyroid gland is surgically absent.   Evaluation of the right thyroid resection bed demonstrates a small rounded soft tissue nodule measuring 6 x 5 x 5 mm.   In the left thyroid resection bed there is an oblong hypoechoic collection measuring 12 x 5 x 5 mm.   IMPRESSION: 1. Surgical changes of total thyroidectomy. 2. Small hypoechoic regions measuring 5 x 6 x 5 mm in the right resection bed and 12 x 5 x 5 mm in the left resection bed are noted. Differential considerations include small amounts of postoperative hematoma, surgical changes, or residual thyroid tissue. Recommend attention on follow-up imaging.   ASSESSMENT/PLAN/RECOMMENDATIONS:   Papillary thyroid carcinoma:   -Patient is S/P t total thyroidectomy on Jul 05, 2021, oncocytic variant, unifocal, 3.8 cm at max diameter, angioinvasion not identified, NO lymph nodes where submitted -Given oncocytic variant and in review of the literature there has been significant  controversy about the biological behavior and optimal management of oncocytic- PTC variant but overall they have similar prognosis to non- oncocytic PTC.  - RAI ablation use in  the setting of oncocytic PTC is variable due to the suggestion that there is limited uptake of RAI and therefore limited benefit, but we did discuss during today's visit that if her TG continues to trend up and thyroid bed nodules have not decreased or resolved I would recommend radioactive iodine remnant ablation -TSH goal 0.5-2 u IU/mL -We will proceed with TG, TG AB -We proceed with thyroid bed ultrasound  -  2.  Postoperative hypothyroidism  -TSH has changed from 7.3 u IU/mL on levothyroxine 175 mcg dose to 0.03 u IU/mL on 200 mcg dose, absorption has improved by taking it at night - TSH today is below goal, historically her TSH has been above goal, somehow her absorption is better by taking levothyroxine at night -I will reduce levothyroxine slightly as below to be rechecked in 2 months  Medication  Change levothyroxine 200 mcg, half a tablet on Sundays and 1 tablet the rest of the week   F/U in 6 months    Signed electronically by: Lyndle Herrlich, MD  [facility] Endocrinology  [facility] [address] [place], [place] [address] Phone: [phone] FAX: [phone]   CC: Tollie Eth, NP [address] [place] [address] Phone: [phone] Fax: [phone]   Return to Endocrinology clinic as below: Future Appointments  Date Time Provider Department Center  07/24/2022 10:30 AM [facility] [place] 1 [id number]  01/17/2023  7:50 AM Vickey Ewbank, Konrad Dolores, MD [id number] None

## 2022-07-18 LAB — THYROGLOBULIN LEVEL: Thyroglobulin: 0.1 ng/mL — ABNORMAL LOW

## 2022-07-18 LAB — THYROGLOBULIN ANTIBODY: Thyroglobulin Ab: <1 IU/mL (ref <=1)

## 2022-07-19 ENCOUNTER — Encounter: Payer: Self-pay | Admitting: Internal Medicine

## 2022-07-24 ENCOUNTER — Ambulatory Visit
Admission: RE | Admit: 2022-07-24 | Discharge: 2022-07-24 | Disposition: A | Discharge status: Home / Self Care | Payer: Medicare PPO | Source: Ambulatory Visit | Attending: Internal Medicine | Admitting: Internal Medicine

## 2022-07-24 DIAGNOSIS — Z09 Encounter for follow-up examination after completed treatment for conditions other than malignant neoplasm: Secondary | ICD-10-CM | POA: Diagnosis not present

## 2022-07-24 DIAGNOSIS — Z85850 Personal history of malignant neoplasm of thyroid: Secondary | ICD-10-CM

## 2022-07-25 ENCOUNTER — Other Ambulatory Visit: Payer: Self-pay | Admitting: Nurse Practitioner

## 2022-07-25 DIAGNOSIS — F413 Other mixed anxiety disorders: Secondary | ICD-10-CM

## 2022-07-25 NOTE — Telephone Encounter (Signed)
Refill request last apt 03/28/22. 

## 2022-08-30 ENCOUNTER — Other Ambulatory Visit (HOSPITAL_BASED_OUTPATIENT_CLINIC_OR_DEPARTMENT_OTHER): Payer: Self-pay | Admitting: Nurse Practitioner

## 2022-08-30 DIAGNOSIS — F413 Other mixed anxiety disorders: Secondary | ICD-10-CM

## 2022-08-30 NOTE — Telephone Encounter (Signed)
Refill request last apt 03/28/22. 

## 2022-10-04 IMAGING — CT CT CHEST W/ CM
2 of 6 series · 13 of 46 positions shown, 15 images · IV contrast (APPLIED)
Comparison: None.
COMPARISON: None.

Addendum:
CLINICAL DATA: History of hiatal hernia on recent endoscopy with
anemia, initial encounter

EXAM:
CT CHEST AND ABDOMEN WITH CONTRAST
TECHNIQUE: Multidetector CT imaging of the chest and abdomen was performed
following the standard protocol during bolus administration of
intravenous contrast.

[Series 505: thins · axial · 0.89mm/px · z∈[+313,+678]mm · 10 of 639 slices shown, 12 images]
[im 59/639  soft-tissue]
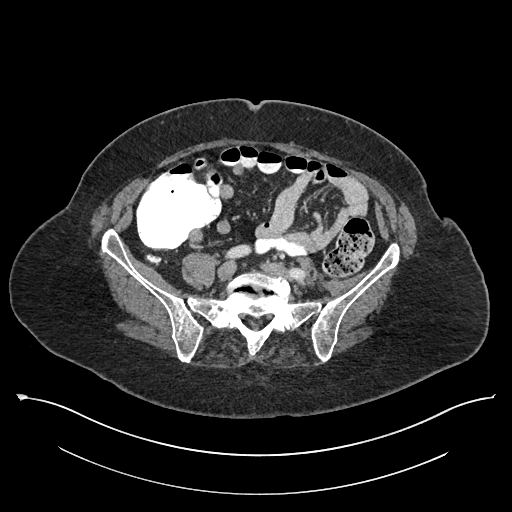
[im 59/639  bone]
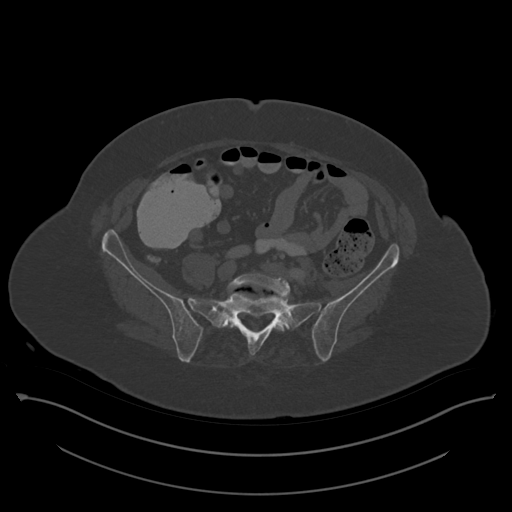
[im 117/639  soft-tissue]
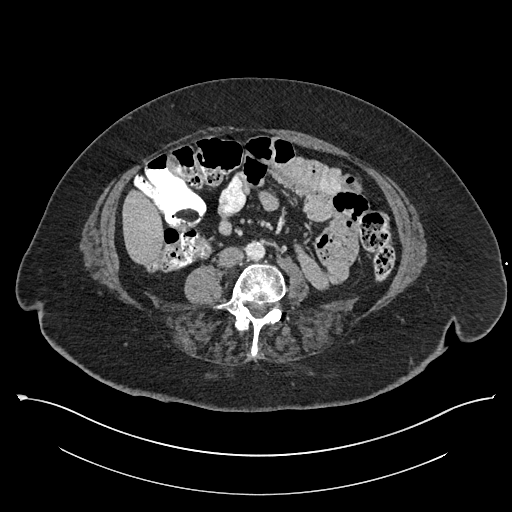
[im 175/639  soft-tissue]
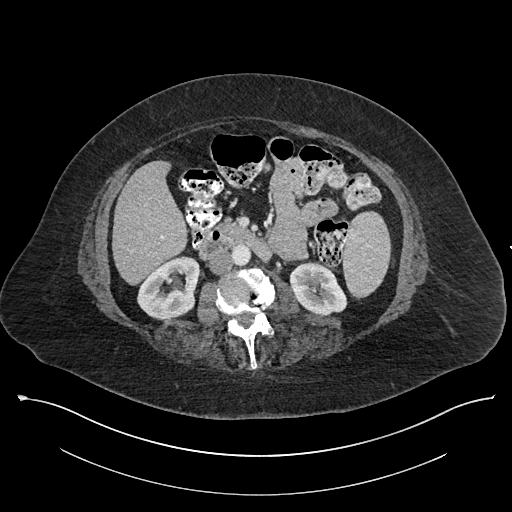
[im 233/639  soft-tissue]
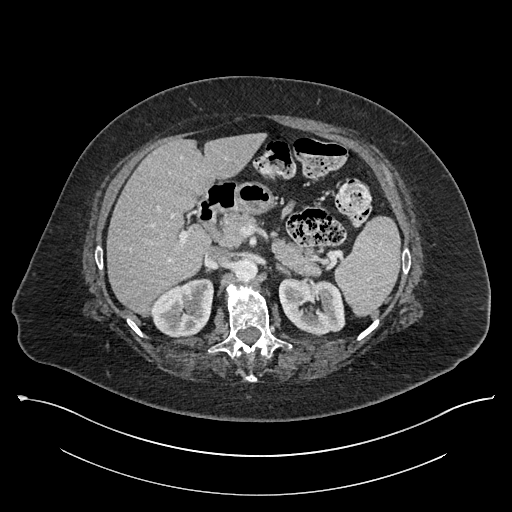
[im 291/639  soft-tissue]
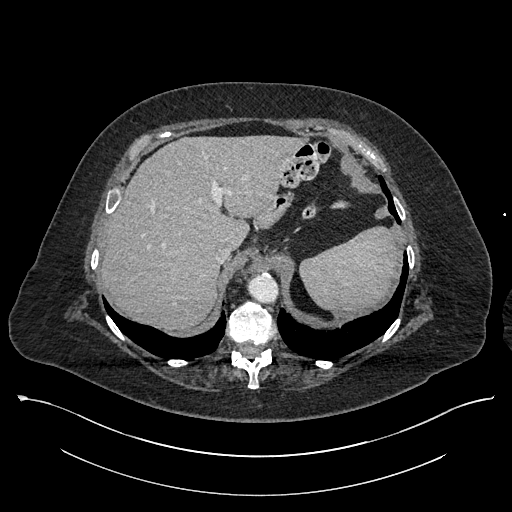
[im 349/639  soft-tissue]
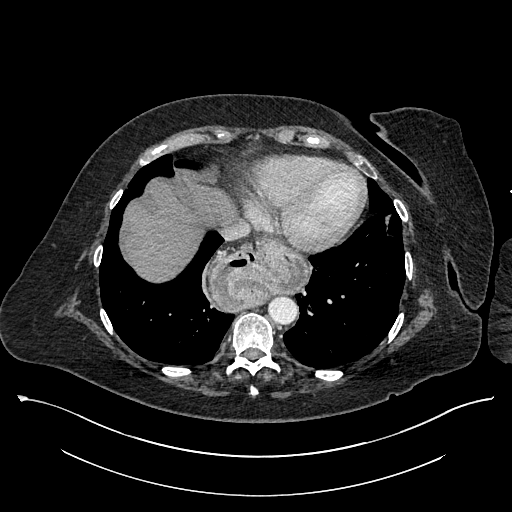
[im 407/639  soft-tissue]
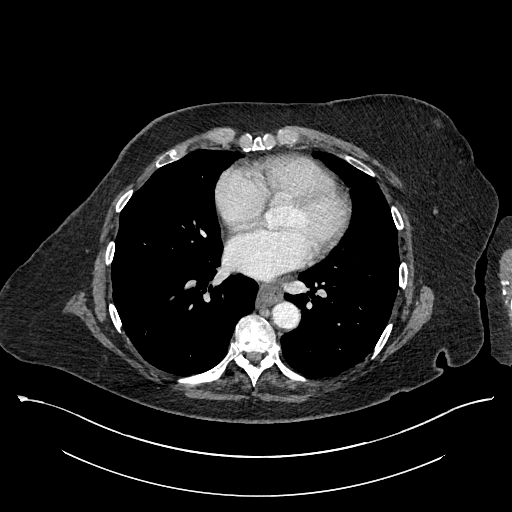
[im 465/639  soft-tissue]
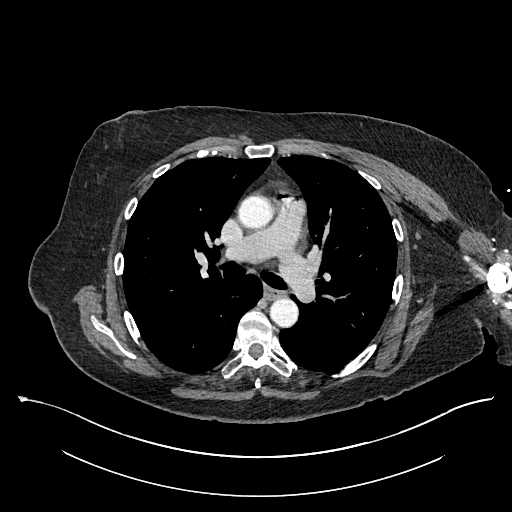
[im 523/639  soft-tissue]
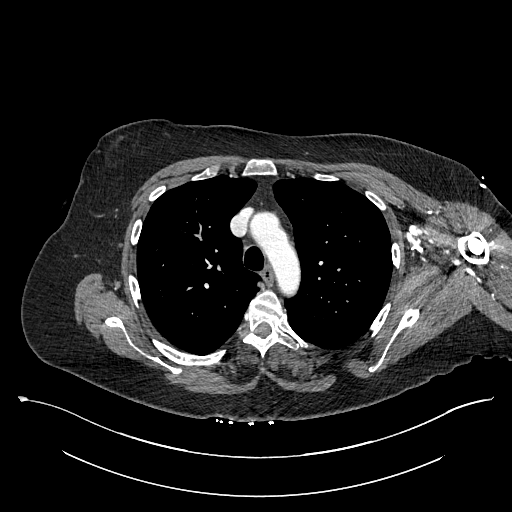
[im 523/639  bone]
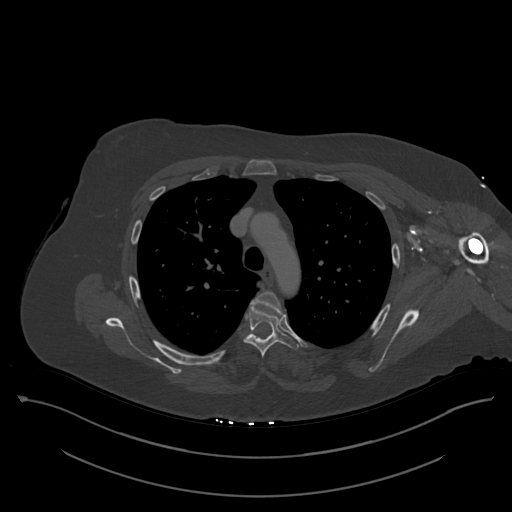
[im 581/639  soft-tissue]
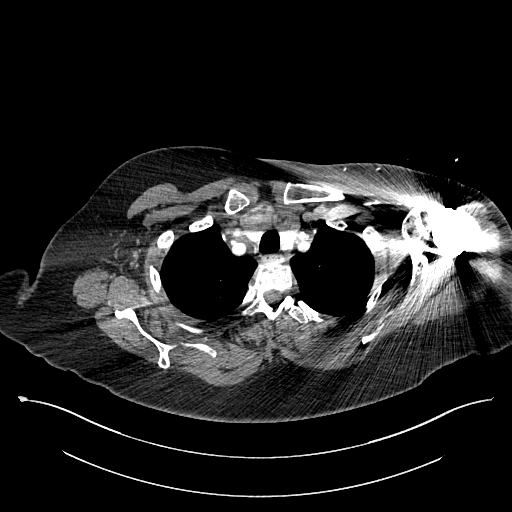

[Series 507: coronal · coronal · 0.82mm/px · 3 of 106 slices shown]
[im 36/106  soft-tissue]
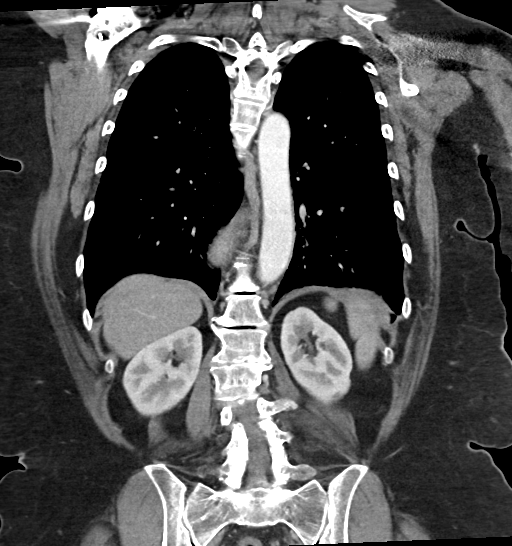
[im 47/106  soft-tissue]
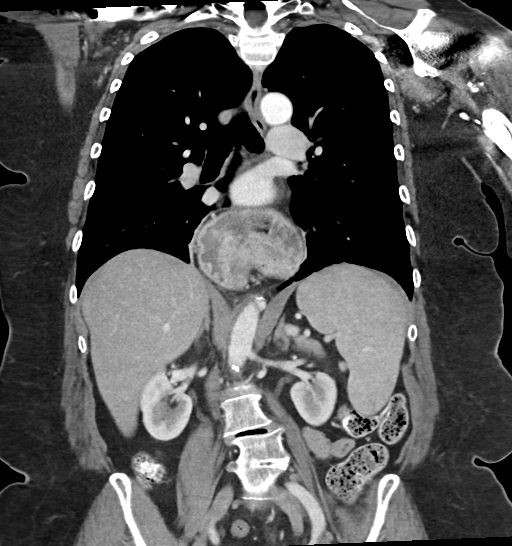
[im 59/106  soft-tissue]
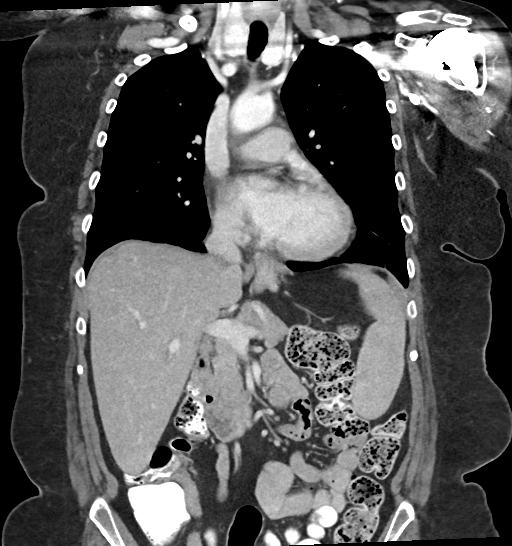

[13 of 46 positions shown; findings below may reference images not displayed]

RADIATION DOSE REDUCTION: This exam was performed according to the
departmental dose-optimization program which includes automated
exposure control, adjustment of the mA and/or kV according to
patient size and/or use of iterative reconstruction technique.

CONTRAST:  100mL OMNIPAQUE IOHEXOL 300 MG/ML  SOLN
FINDINGS: CT CHEST FINDINGS

Cardiovascular: Thoracic aorta is well visualize without aneurysmal
dilatation or dissection. Minimal atherosclerotic calcifications are
noted. The pulmonary artery as visualized is within normal limits.
Some air is noted within the pulmonary artery related to the recent
IV initiation. No cardiac enlargement is seen. No significant
coronary calcifications are noted.

Mediastinum/Nodes: Thoracic inlet shows a few scattered hypodense
nodules within the left lobe of thyroid. Largest of these measures
approximately 2.2 cm in craniocaudad projection. No sizable hilar or
mediastinal adenopathy is noted. The esophagus as visualized is
within normal limits with the exception of a large hiatal hernia.
This has a partial degree of mesenteroaxial volvulus although no
obstructive changes are seen.

Lungs/Pleura: Lungs are well aerated bilaterally. No focal
infiltrate, effusion or parenchymal nodule is noted. Minimal
scarring is noted in the right middle lobe and lingula.

Musculoskeletal: No acute rib abnormality is noted. Bilateral
shoulder prostheses are seen. Mild degenerative changes of the
thoracic spine are noted.

CT ABDOMEN FINDINGS

Hepatobiliary: Gallbladder has been surgically removed. Liver is
within normal limits.

Pancreas: Unremarkable. No pancreatic ductal dilatation or
surrounding inflammatory changes.

Spleen: Normal in size without focal abnormality.

Adrenals/Urinary Tract: Adrenal glands are unremarkable. Kidneys
demonstrate a normal enhancement pattern bilaterally. Normal
excretion is noted bilaterally. The bladder is not included on these
images.

Stomach/Bowel: Fecal material is noted throughout the visualized
portions of colon. No obstructive or inflammatory changes are seen.
Small bowel is within normal limits. Stomach shows approximately 70%
in the chest cavity with mesenteroaxial volvulus. No obstructive
changes are noted.

Vascular/Lymphatic: Aortic atherosclerosis. No enlarged abdominal or
pelvic lymph nodes.

Other: No abdominal wall hernia or abnormality. No abdominopelvic
ascites.

Musculoskeletal: Degenerative changes of the lumbar spine are noted.
IMPRESSION: CT of the chest: Hiatal hernia with approximately 70% of the stomach
within the chest and evidence ofmesenteroaxial volvulus.

2.2 cm hypodense nodule within the left lobe of the liver. A few
smaller nodules are noted as well. Recommend thyroid US.

Reference: [HOSPITAL]. [DATE]): 143-50

CT of the abdomen: Hiatal hernia as described above.

No other focal abnormality is noted.

ADDENDUM:
Voice recognition error occurred in the impression of the CT of the
chest: The second sentence should read: 2.2 cm hypodense nodule
within the left lobe of the thyroid. Two smaller nodules are noted
as well. Recommend thyroid ultrasound.

*** End of Addendum ***
RADIATION DOSE REDUCTION: This exam was performed according to the
departmental dose-optimization program which includes automated
exposure control, adjustment of the mA and/or kV according to
patient size and/or use of iterative reconstruction technique.

CONTRAST:  100mL OMNIPAQUE IOHEXOL 300 MG/ML  SOLN
FINDINGS: CT CHEST FINDINGS

Cardiovascular: Thoracic aorta is well visualize without aneurysmal
dilatation or dissection. Minimal atherosclerotic calcifications are
noted. The pulmonary artery as visualized is within normal limits.
Some air is noted within the pulmonary artery related to the recent
IV initiation. No cardiac enlargement is seen. No significant
coronary calcifications are noted.

Mediastinum/Nodes: Thoracic inlet shows a few scattered hypodense
nodules within the left lobe of thyroid. Largest of these measures
approximately 2.2 cm in craniocaudad projection. No sizable hilar or
mediastinal adenopathy is noted. The esophagus as visualized is
within normal limits with the exception of a large hiatal hernia.
This has a partial degree of mesenteroaxial volvulus although no
obstructive changes are seen.

Lungs/Pleura: Lungs are well aerated bilaterally. No focal
infiltrate, effusion or parenchymal nodule is noted. Minimal
scarring is noted in the right middle lobe and lingula.

Musculoskeletal: No acute rib abnormality is noted. Bilateral
shoulder prostheses are seen. Mild degenerative changes of the
thoracic spine are noted.

CT ABDOMEN FINDINGS

Hepatobiliary: Gallbladder has been surgically removed. Liver is
within normal limits.

Pancreas: Unremarkable. No pancreatic ductal dilatation or
surrounding inflammatory changes.

Spleen: Normal in size without focal abnormality.

Adrenals/Urinary Tract: Adrenal glands are unremarkable. Kidneys
demonstrate a normal enhancement pattern bilaterally. Normal
excretion is noted bilaterally. The bladder is not included on these
images.

Stomach/Bowel: Fecal material is noted throughout the visualized
portions of colon. No obstructive or inflammatory changes are seen.
Small bowel is within normal limits. Stomach shows approximately 70%
in the chest cavity with mesenteroaxial volvulus. No obstructive
changes are noted.

Vascular/Lymphatic: Aortic atherosclerosis. No enlarged abdominal or
pelvic lymph nodes.

Other: No abdominal wall hernia or abnormality. No abdominopelvic
ascites.

Musculoskeletal: Degenerative changes of the lumbar spine are noted.
IMPRESSION: CT of the chest: Hiatal hernia with approximately 70% of the stomach
within the chest and evidence ofmesenteroaxial volvulus.

2.2 cm hypodense nodule within the left lobe of the liver. A few
smaller nodules are noted as well. Recommend thyroid US.

Reference: [HOSPITAL]. [DATE]): 143-50

CT of the abdomen: Hiatal hernia as described above.

No other focal abnormality is noted.

## 2022-10-04 IMAGING — CT CT ABDOMEN W/ CM
2 of 5 series · 12 of 46 positions shown, 14 images · IV contrast (APPLIED)
Comparison: None.
COMPARISON: None.

Addendum:
CLINICAL DATA: History of hiatal hernia on recent endoscopy with
anemia, initial encounter

EXAM:
CT CHEST AND ABDOMEN WITH CONTRAST
TECHNIQUE: Multidetector CT imaging of the chest and abdomen was performed
following the standard protocol during bolus administration of
intravenous contrast.

[Series 2: cap with · axial · 0.89mm/px · z∈[+314,+674]mm · 9 of 90 slices shown, 11 images]
[im 9/90  soft-tissue]
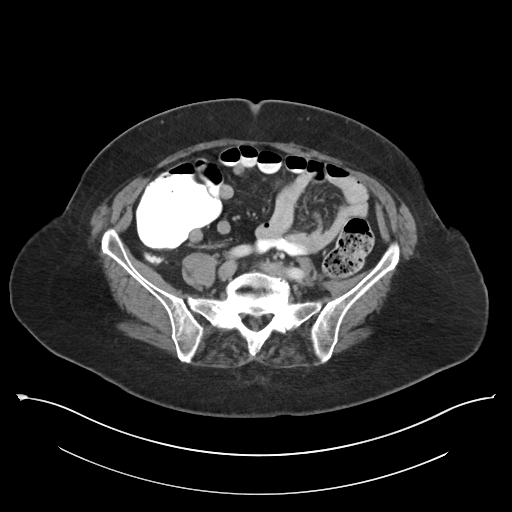
[im 9/90  bone]
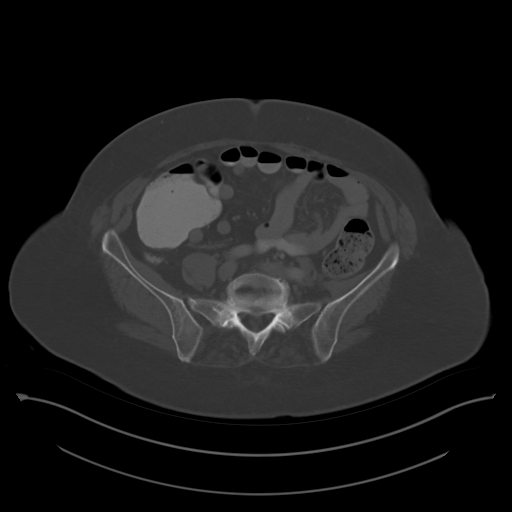
[im 18/90  soft-tissue]
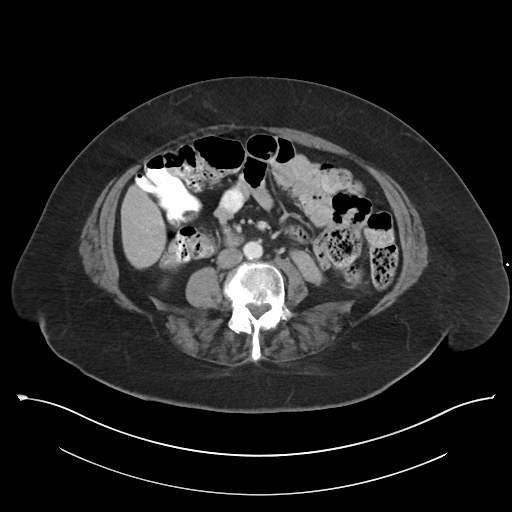
[im 27/90  soft-tissue]
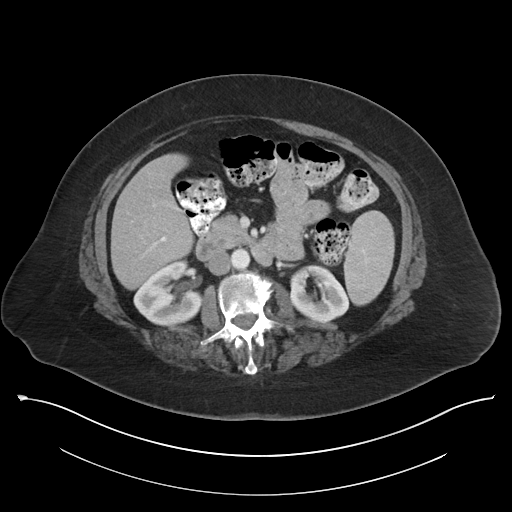
[im 36/90  soft-tissue]
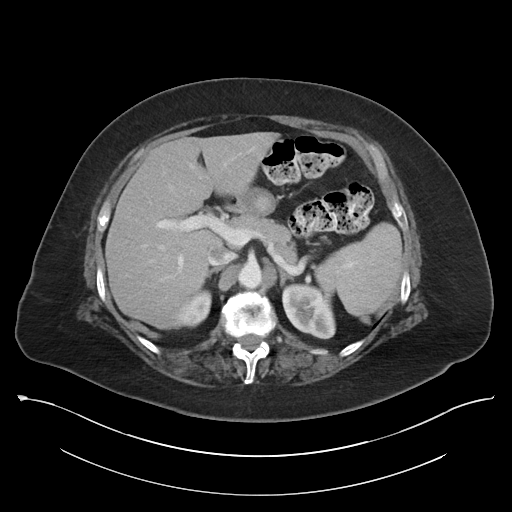
[im 45/90  soft-tissue]
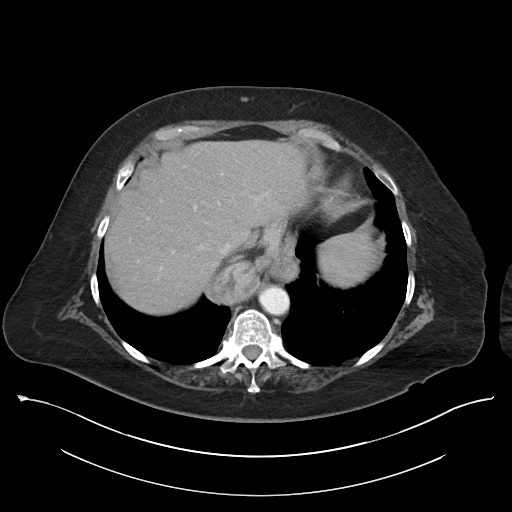
[im 54/90  soft-tissue]
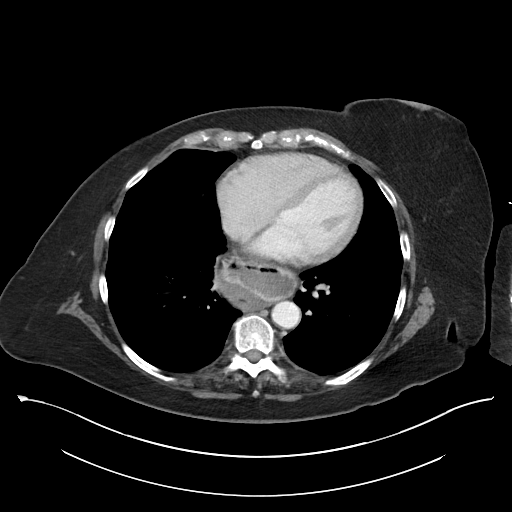
[im 63/90  soft-tissue]
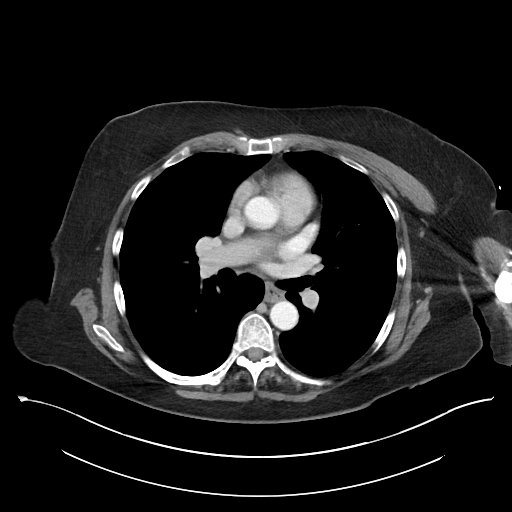
[im 72/90  soft-tissue]
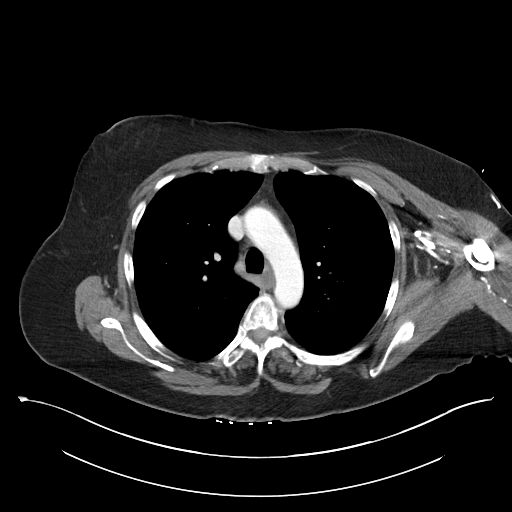
[im 81/90  soft-tissue]
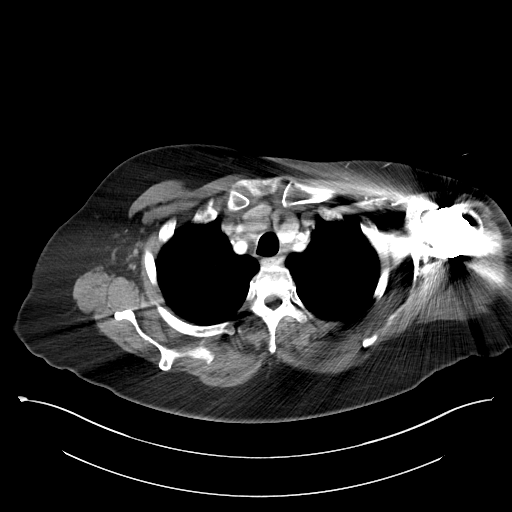
[im 81/90  bone]
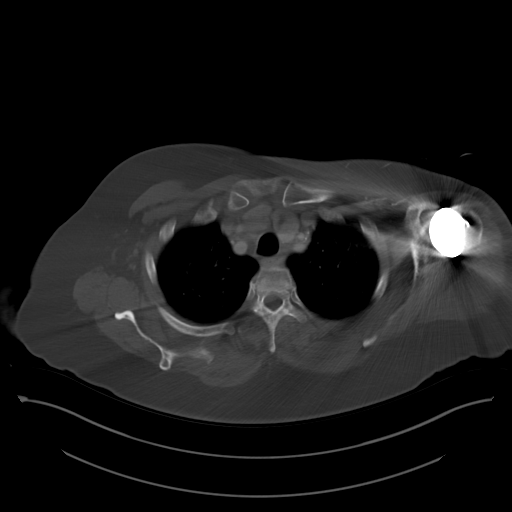

[Series 5: coronal · coronal · 0.82mm/px · 3 of 106 slices shown]
[im 36/106  soft-tissue]
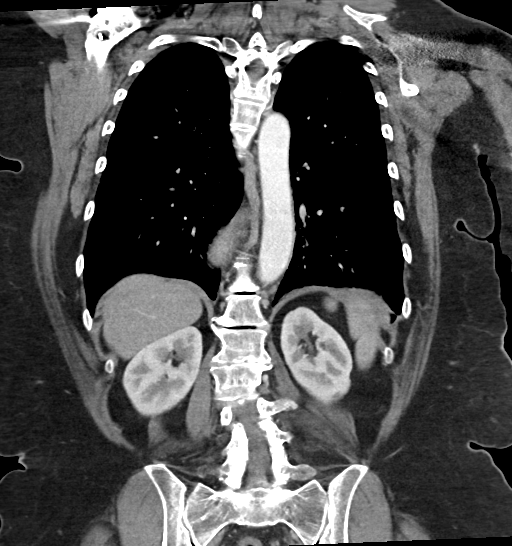
[im 47/106  soft-tissue]
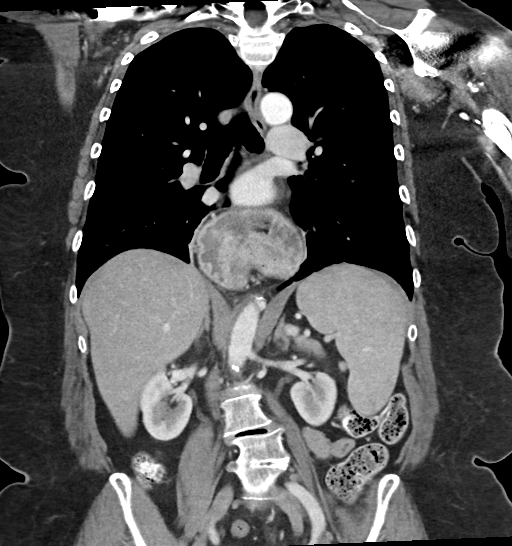
[im 59/106  soft-tissue]
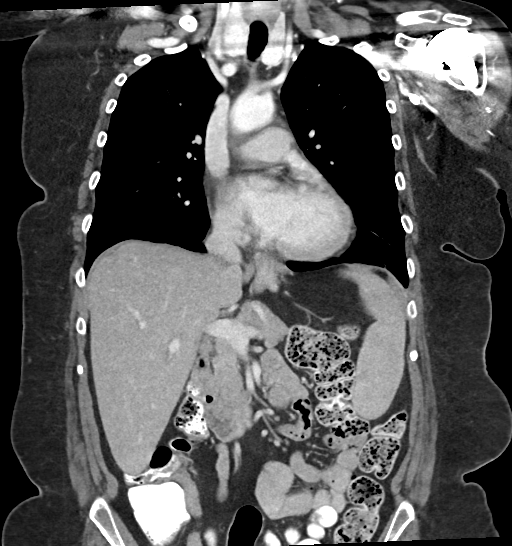

[12 of 46 positions shown; findings below may reference images not displayed]

RADIATION DOSE REDUCTION: This exam was performed according to the
departmental dose-optimization program which includes automated
exposure control, adjustment of the mA and/or kV according to
patient size and/or use of iterative reconstruction technique.

CONTRAST:  100mL OMNIPAQUE IOHEXOL 300 MG/ML  SOLN
FINDINGS: CT CHEST FINDINGS

Cardiovascular: Thoracic aorta is well visualize without aneurysmal
dilatation or dissection. Minimal atherosclerotic calcifications are
noted. The pulmonary artery as visualized is within normal limits.
Some air is noted within the pulmonary artery related to the recent
IV initiation. No cardiac enlargement is seen. No significant
coronary calcifications are noted.

Mediastinum/Nodes: Thoracic inlet shows a few scattered hypodense
nodules within the left lobe of thyroid. Largest of these measures
approximately 2.2 cm in craniocaudad projection. No sizable hilar or
mediastinal adenopathy is noted. The esophagus as visualized is
within normal limits with the exception of a large hiatal hernia.
This has a partial degree of mesenteroaxial volvulus although no
obstructive changes are seen.

Lungs/Pleura: Lungs are well aerated bilaterally. No focal
infiltrate, effusion or parenchymal nodule is noted. Minimal
scarring is noted in the right middle lobe and lingula.

Musculoskeletal: No acute rib abnormality is noted. Bilateral
shoulder prostheses are seen. Mild degenerative changes of the
thoracic spine are noted.

CT ABDOMEN FINDINGS

Hepatobiliary: Gallbladder has been surgically removed. Liver is
within normal limits.

Pancreas: Unremarkable. No pancreatic ductal dilatation or
surrounding inflammatory changes.

Spleen: Normal in size without focal abnormality.

Adrenals/Urinary Tract: Adrenal glands are unremarkable. Kidneys
demonstrate a normal enhancement pattern bilaterally. Normal
excretion is noted bilaterally. The bladder is not included on these
images.

Stomach/Bowel: Fecal material is noted throughout the visualized
portions of colon. No obstructive or inflammatory changes are seen.
Small bowel is within normal limits. Stomach shows approximately 70%
in the chest cavity with mesenteroaxial volvulus. No obstructive
changes are noted.

Vascular/Lymphatic: Aortic atherosclerosis. No enlarged abdominal or
pelvic lymph nodes.

Other: No abdominal wall hernia or abnormality. No abdominopelvic
ascites.

Musculoskeletal: Degenerative changes of the lumbar spine are noted.
IMPRESSION: CT of the chest: Hiatal hernia with approximately 70% of the stomach
within the chest and evidence ofmesenteroaxial volvulus.

2.2 cm hypodense nodule within the left lobe of the liver. A few
smaller nodules are noted as well. Recommend thyroid US.

Reference: [HOSPITAL]. [DATE]): 143-50

CT of the abdomen: Hiatal hernia as described above.

No other focal abnormality is noted.

ADDENDUM:
Voice recognition error occurred in the impression of the CT of the
chest: The second sentence should read: 2.2 cm hypodense nodule
within the left lobe of the thyroid. Two smaller nodules are noted
as well. Recommend thyroid ultrasound.

*** End of Addendum ***
RADIATION DOSE REDUCTION: This exam was performed according to the
departmental dose-optimization program which includes automated
exposure control, adjustment of the mA and/or kV according to
patient size and/or use of iterative reconstruction technique.

CONTRAST:  100mL OMNIPAQUE IOHEXOL 300 MG/ML  SOLN
FINDINGS: CT CHEST FINDINGS

Cardiovascular: Thoracic aorta is well visualize without aneurysmal
dilatation or dissection. Minimal atherosclerotic calcifications are
noted. The pulmonary artery as visualized is within normal limits.
Some air is noted within the pulmonary artery related to the recent
IV initiation. No cardiac enlargement is seen. No significant
coronary calcifications are noted.

Mediastinum/Nodes: Thoracic inlet shows a few scattered hypodense
nodules within the left lobe of thyroid. Largest of these measures
approximately 2.2 cm in craniocaudad projection. No sizable hilar or
mediastinal adenopathy is noted. The esophagus as visualized is
within normal limits with the exception of a large hiatal hernia.
This has a partial degree of mesenteroaxial volvulus although no
obstructive changes are seen.

Lungs/Pleura: Lungs are well aerated bilaterally. No focal
infiltrate, effusion or parenchymal nodule is noted. Minimal
scarring is noted in the right middle lobe and lingula.

Musculoskeletal: No acute rib abnormality is noted. Bilateral
shoulder prostheses are seen. Mild degenerative changes of the
thoracic spine are noted.

CT ABDOMEN FINDINGS

Hepatobiliary: Gallbladder has been surgically removed. Liver is
within normal limits.

Pancreas: Unremarkable. No pancreatic ductal dilatation or
surrounding inflammatory changes.

Spleen: Normal in size without focal abnormality.

Adrenals/Urinary Tract: Adrenal glands are unremarkable. Kidneys
demonstrate a normal enhancement pattern bilaterally. Normal
excretion is noted bilaterally. The bladder is not included on these
images.

Stomach/Bowel: Fecal material is noted throughout the visualized
portions of colon. No obstructive or inflammatory changes are seen.
Small bowel is within normal limits. Stomach shows approximately 70%
in the chest cavity with mesenteroaxial volvulus. No obstructive
changes are noted.

Vascular/Lymphatic: Aortic atherosclerosis. No enlarged abdominal or
pelvic lymph nodes.

Other: No abdominal wall hernia or abnormality. No abdominopelvic
ascites.

Musculoskeletal: Degenerative changes of the lumbar spine are noted.
IMPRESSION: CT of the chest: Hiatal hernia with approximately 70% of the stomach
within the chest and evidence ofmesenteroaxial volvulus.

2.2 cm hypodense nodule within the left lobe of the liver. A few
smaller nodules are noted as well. Recommend thyroid US.

Reference: [HOSPITAL]. [DATE]): 143-50

CT of the abdomen: Hiatal hernia as described above.

No other focal abnormality is noted.

## 2022-10-14 IMAGING — US US THYROID
1 series · 12 of 25 positions shown · non-contrast
Comparison: 04/06/2021

CLINICAL DATA: Thyroid nodule by chest CT 04/06/2021

EXAM:
THYROID ULTRASOUND
TECHNIQUE: Ultrasound examination of the thyroid gland and adjacent soft
tissues was performed.

[Series 1: us thyroid · 0.05mm/px · 12 of 49 slices shown]
[im 3/49]
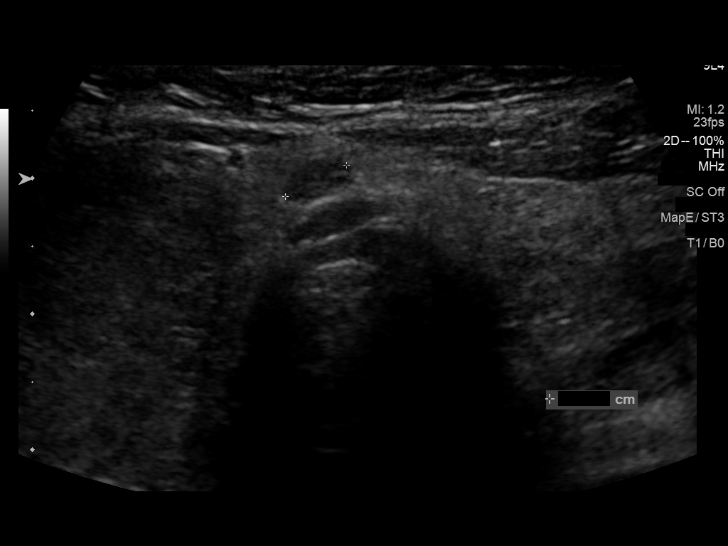
[im 7/49]
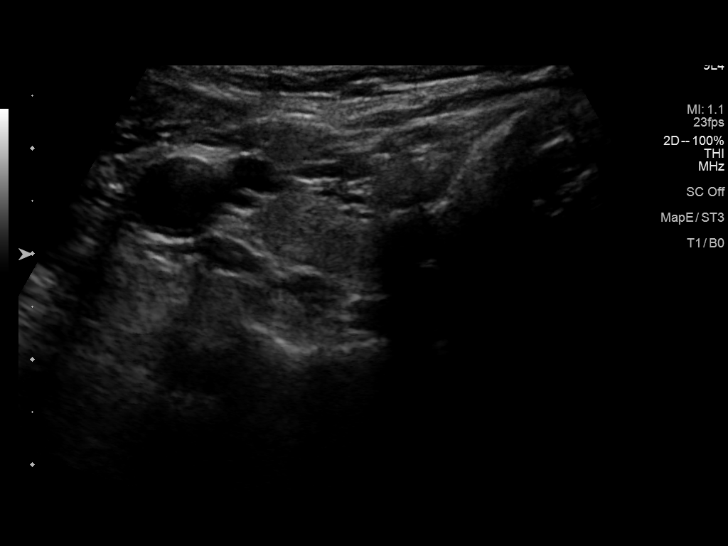
[im 11/49]
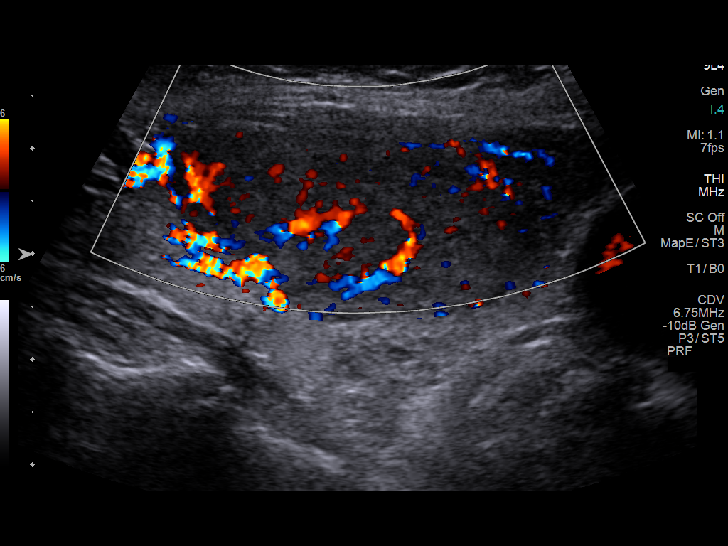
[im 15/49]
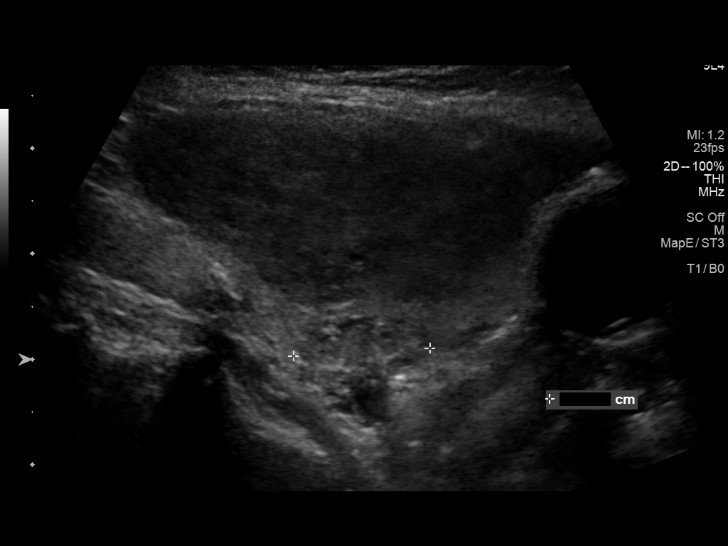
[im 19/49]
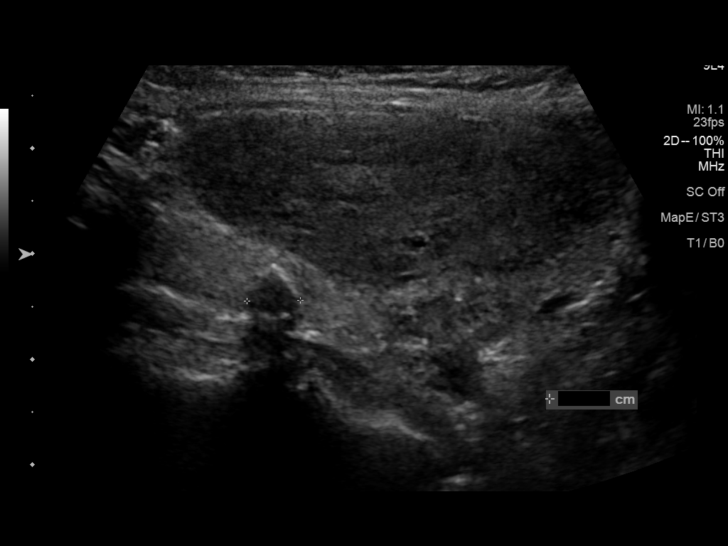
[im 23/49]
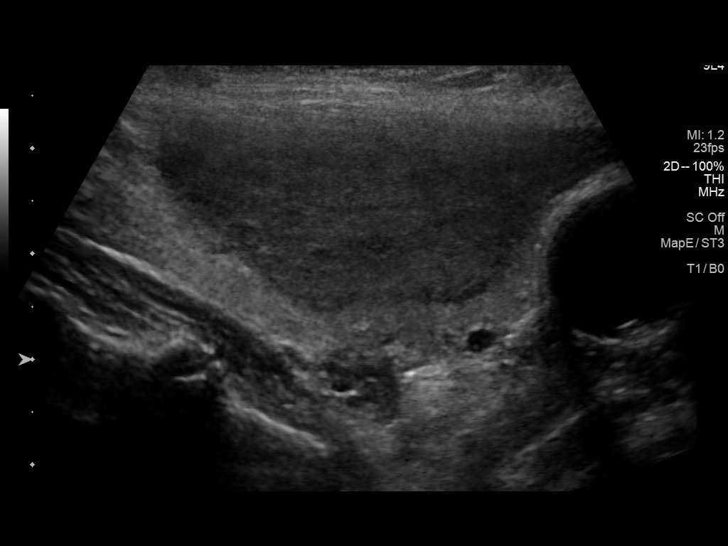
[im 27/49]
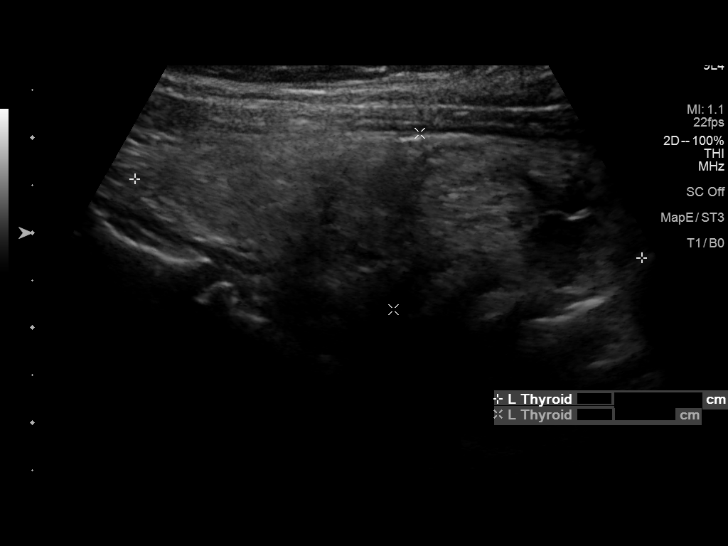
[im 31/49]
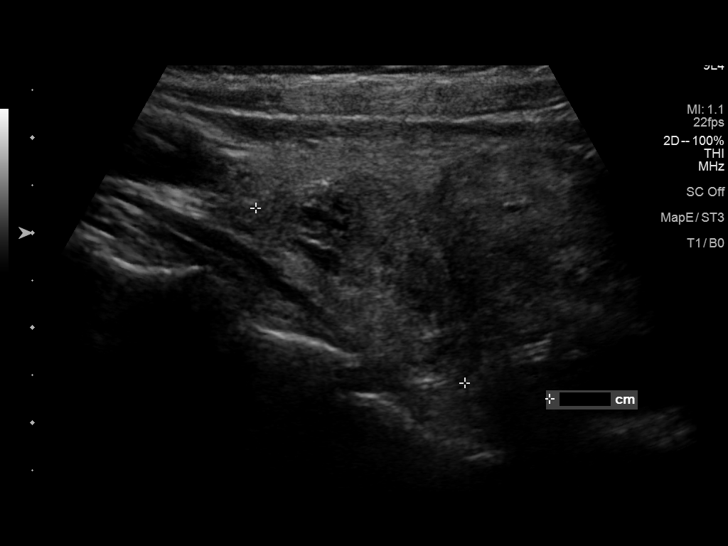
[im 35/49]
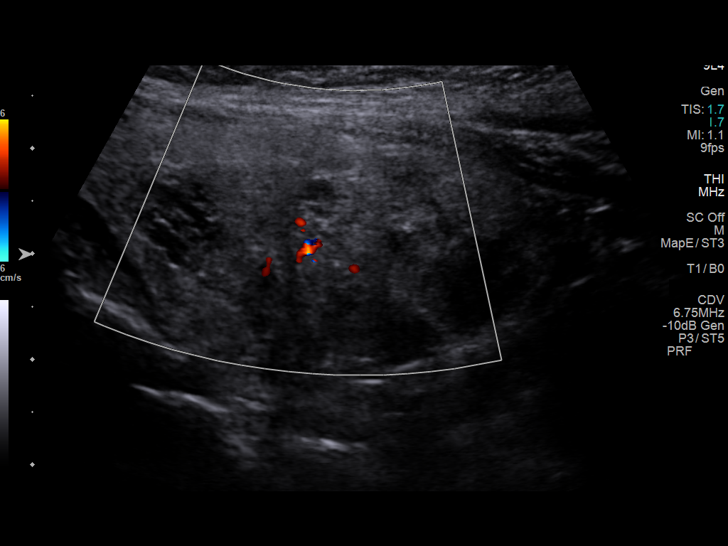
[im 39/49]
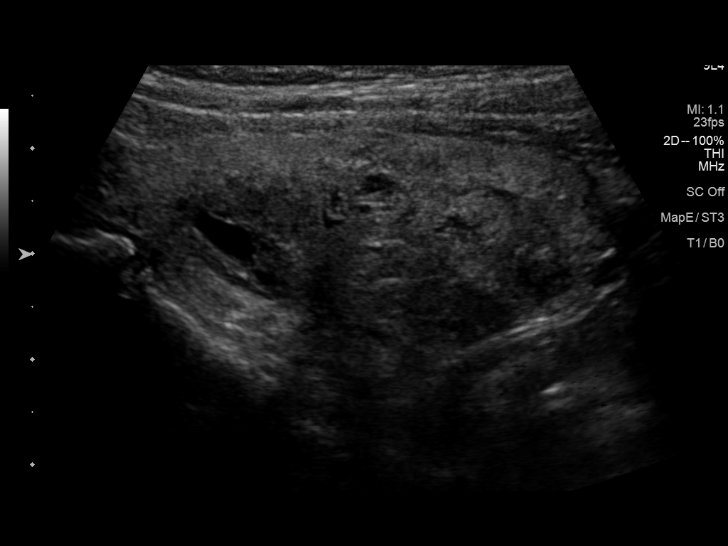
[im 43/49]
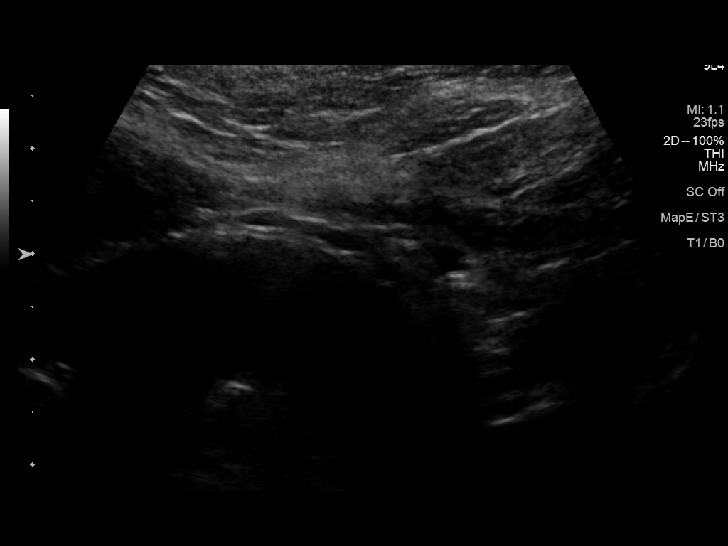
[im 47/49]
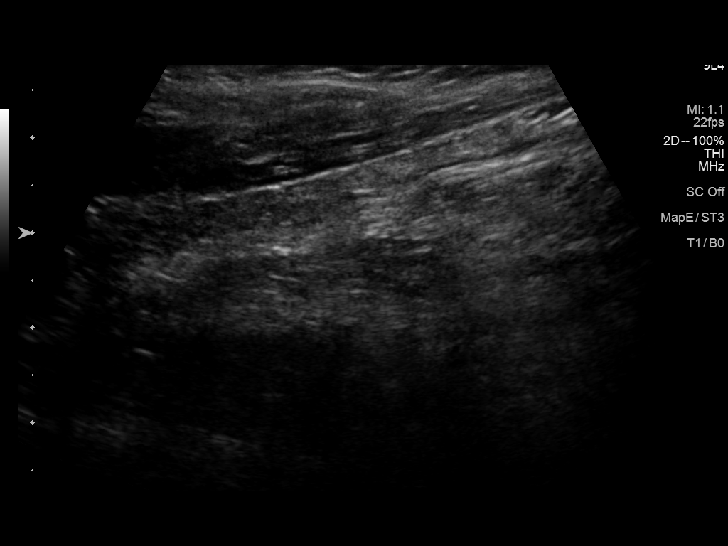

[12 of 25 positions shown; findings below may reference images not displayed]

FINDINGS: Parenchymal Echotexture: Moderately heterogenous

Isthmus: 6 mm

Right lobe: 6.0 x 3.0 x 3.2 cm

Left lobe: 5.4 x 1.9 x 1.7 cm

_________________________________________________________

Estimated total number of nodules >/= 1 cm: 4

Number of spongiform nodules >/=  2 cm not described below (TR1): 0

Number of mixed cystic and solid nodules >/= 1.5 cm not described
below (TR2): 0

_________________________________________________________

Nodule # 1:

Location: Right; Mid

Maximum size: 4.5 cm; Other 2 dimensions: 3.2 x 2.1 cm

Composition: solid/almost completely solid (2)

Echogenicity: hypoechoic (2)

Shape: not taller-than-wide (0)

Margins: smooth (0)

Echogenic foci: none (0)

ACR TI-RADS total points: 4.

ACR TI-RADS risk category: TR4 (4-6 points).

ACR TI-RADS recommendations:

**Given size (>/= 1.5 cm) and appearance, fine needle aspiration of
this moderately suspicious nodule should be considered based on
TI-RADS criteria.

_________________________________________________________

Nodule # 2:

Location: Right; Mid

Maximum size: 1.3 cm; Other 2 dimensions: 1.2 x 0.9 cm

Composition: mixed cystic and solid (1)

Echogenicity: isoechoic (1)

Shape: not taller-than-wide (0)

Margins: ill-defined (0)

Echogenic foci: punctate echogenic foci (3)

ACR TI-RADS total points: 5.

ACR TI-RADS risk category: TR4 (4-6 points).

ACR TI-RADS recommendations:

*Given size (>/= 1 - 1.4 cm) and appearance, a follow-up ultrasound
in 1 year should be considered based on TI-RADS criteria.

_________________________________________________________

Nodule # 4:

Location: Left; Mid

Maximum size: 2.9 cm; Other 2 dimensions: 2.2 x 2.2 cm

Composition: solid/almost completely solid (2)

Echogenicity: isoechoic (1)

Shape: not taller-than-wide (0)

Margins: ill-defined (0)

Echogenic foci: none (0)

ACR TI-RADS total points: 3.

ACR TI-RADS risk category: TR3 (3 points).

ACR TI-RADS recommendations:

**Given size (>/= 2.5 cm) and appearance, fine needle aspiration of
this mildly suspicious nodule should be considered based on TI-RADS
criteria.

_________________________________________________________

Nodule # 5:

Location: Left; Inferior

Maximum size: 3.0 cm; Other 2 dimensions: 2.3 x 1.7 cm

Composition: mixed cystic and solid (1)

Echogenicity: isoechoic (1)

Shape: not taller-than-wide (0)

Margins: ill-defined (0)

Echogenic foci: none (0)

ACR TI-RADS total points: 2.

ACR TI-RADS risk category: TR2 (2 points).

ACR TI-RADS recommendations:

This nodule does NOT meet TI-RADS criteria for biopsy or dedicated
follow-up.

_________________________________________________________

No hypervascularity or regional adenopathy.
IMPRESSION: 4.5 cm right mid thyroid TR 4 nodule meets criteria for biopsy as
above. This correlates with the CT finding.

2.9 cm left mid thyroid TR 3 nodule also meets criteria for biopsy
as above.

1.3 cm right mid thyroid TR 4 nodule meets criteria follow-up in 1
year.

The above is in keeping with the ACR TI-RADS recommendations - [HOSPITAL] 0148;[DATE].

## 2022-10-15 ENCOUNTER — Other Ambulatory Visit (INDEPENDENT_AMBULATORY_CARE_PROVIDER_SITE_OTHER): Payer: Medicare PPO

## 2022-10-15 DIAGNOSIS — E890 Postprocedural hypothyroidism: Secondary | ICD-10-CM | POA: Diagnosis not present

## 2022-11-19 ENCOUNTER — Other Ambulatory Visit: Payer: Self-pay | Admitting: Nurse Practitioner

## 2022-11-19 ENCOUNTER — Telehealth: Payer: Medicare PPO | Admitting: Physician Assistant

## 2022-11-19 ENCOUNTER — Encounter: Payer: Self-pay | Admitting: Nurse Practitioner

## 2022-11-19 VITALS — HR 67

## 2022-11-19 DIAGNOSIS — F413 Other mixed anxiety disorders: Secondary | ICD-10-CM

## 2022-11-19 DIAGNOSIS — J208 Acute bronchitis due to other specified organisms: Secondary | ICD-10-CM

## 2022-11-19 DIAGNOSIS — J4541 Moderate persistent asthma with (acute) exacerbation: Secondary | ICD-10-CM | POA: Diagnosis not present

## 2022-11-19 MED ORDER — PREDNISONE 20 MG PO TABS: 60.0000 mg | ORAL_TABLET | Freq: Every day | ORAL | 0 refills | Status: AC

## 2022-11-19 MED ORDER — BENZONATATE 100 MG PO CAPS: 100.0000 mg | ORAL_CAPSULE | Freq: Three times a day (TID) | ORAL | 0 refills | Status: DC | PRN

## 2022-11-19 NOTE — Patient Instructions (Signed)
Brennan Bailey, thank you for joining Piedad Climes, PA-C for today's virtual visit.  While this provider is not your primary care provider (PCP), if your PCP is located in our provider database this encounter information will be shared with them immediately following your visit.   A [facility] MyChart account gives you access to today's visit and all your visits, tests, and labs performed at [facility] " click here if you don't have a [facility] MyChart account or go to [email or web address]  Consent: (Patient) [name] provided verbal consent for this virtual visit at the beginning of the encounter.  Current Medications:  Current Outpatient Medications:    albuterol (VENTOLIN HFA) 108 (90 Base) MCG/ACT inhaler, Inhale 2 puffs into the lungs every 6 (six) hours as needed for wheezing or shortness of breath., Disp: 1 each, Rfl: 3   ALPRAZolam (XANAX) 0.25 MG tablet, TAKE 1/2 TO 1 TABLET BY MOUTH UP TO TWO TIMES A DAY AS NEEDED FOR PANIC Lethea Killings, Disp: 20 tablet, Rfl: 2   Ascorbic Acid (VITAMIN C) 1000 MG tablet, Take 1,000 mg by mouth daily., Disp: , Rfl:    b complex vitamins capsule, Take 1 capsule by mouth daily., Disp: , Rfl:    Benzocaine (SOLARCAINE EX), Apply 1 application. topically daily as needed (bug bites)., Disp: , Rfl:    Budeson-Glycopyrrol-Formoterol (BREZTRI AEROSPHERE) 160-9-4.8 MCG/ACT AERO, INHALE TWO PUFFS BY MOUTH EVERY MORNING AND INHALE TWO PUFFS BY MOUTH EVERY NIGHT AT BEDTIME, Disp: 10.7 g, Rfl: 0   busPIRone (BUSPAR) 7.5 MG tablet, TAKE 1 TABLET BY MOUTH TWICE A DAY, Disp: 60 tablet, Rfl: 3   cetirizine (ZYRTEC) 10 MG tablet, Take 1 tablet (10 mg total) by mouth 2 (two) times daily., Disp: 180 tablet, Rfl: 3   Cholecalciferol (VITAMIN D) 125 MCG (5000 UT) CAPS, Take 5,000 Units by mouth daily., Disp: , Rfl:    fluticasone (FLONASE) 50 MCG/ACT nasal spray, Place 1 spray into both nostrils daily., Disp: 16 g, Rfl: 6    hydrochlorothiazide (HYDRODIURIL) 25 MG tablet, Take 1 tablet (25 mg total) by mouth every morning., Disp: 90 tablet, Rfl: 3   levothyroxine (SYNTHROID) 200 MCG tablet, Take 1 tablet (200 mcg total) by mouth daily., Disp: 90 tablet, Rfl: 3   magnesium oxide (MAG-OX) 400 MG tablet, Take 400 mg by mouth daily., Disp: , Rfl:    sertraline (ZOLOFT) 100 MG tablet, TAKE 1.5 TABLETS BY MOUTH EVERY MORNING, Disp: 135 tablet, Rfl: 3   Medications ordered in this encounter:  No orders of the defined types were placed in this encounter.    *If you need refills on other medications prior to your next appointment, please contact your pharmacy*  Follow-Up: Call back or seek an in-person evaluation if the symptoms worsen or if the condition fails to improve as anticipated.  [facility] [phone]  Other Instructions Please keep hydrated and rest. Ok to continue your OTC medications along with your regular allergy and asthma medications. You can use the nebulizer treatments instead of your albuterol inhaler.  Take the Tessalon and Prednisone as directed. If symptoms are not turning the corner, you not acutely worsening symptoms despite treatment, or Oxygen levels are getting < 90 with treatment, you need evaluation in -person ASAP.   If you have been instructed to have an in-person evaluation today at a local Urgent Care facility, please use the link below. It will take you to a list of all of our available [facility]gent [facility], including address, phone number and hours of operation. Please do not delay care.  [facility]  If you or a family member do not have a primary care provider, use the link below to schedule a visit and establish care. When you choose a [facility] primary care physician or advanced practice provider, you gain a long-term partner in health. Find a Primary Care Provider  Learn more about [facility]'s in-office and virtual care options: [facility] - Get Care Now

## 2022-11-19 NOTE — Progress Notes (Signed)
Virtual Visit Consent   [name], you are scheduled for a virtual visit with a [facility] provider today. Just as with appointments in the office, your consent must be obtained to participate. Your consent will be active for this visit and any virtual visit you may have with one of our providers in the next 365 days. If you have a MyChart account, a copy of this consent can be sent to you electronically.  As this is a virtual visit, video technology does not allow for your provider to perform a traditional examination. This may limit your provider's ability to fully assess your condition. If your provider identifies any concerns that need to be evaluated in person or the need to arrange testing (such as labs, EKG, etc.), we will make arrangements to do so. Although advances in technology are sophisticated, we cannot ensure that it will always work on either your end or our end. If the connection with a video visit is poor, the visit may have to be switched to a telephone visit. With either a video or telephone visit, we are not always able to ensure that we have a secure connection.  By engaging in this virtual visit, you consent to the provision of healthcare and authorize for your insurance to be billed (if applicable) for the services provided during this visit. Depending on your insurance coverage, you may receive a charge related to this service.  I need to obtain your verbal consent now. Are you willing to proceed with your visit today? [name] has provided verbal consent on 11/19/2022 for a virtual visit (video or telephone). Piedad Climes, [place]  Date: 11/19/2022 8:58 AM  Virtual Visit via Video Note   I, Piedad Climes, connected with  [name]  ([id number], [birth year]) on 11/19/22 at  8:45 AM EDT by a video-enabled telemedicine application and verified that I am speaking with the correct person using two identifiers.  Location: Patient: Virtual Visit Location  Patient: Home Provider: Virtual Visit Location Provider: Home Office   I discussed the limitations of evaluation and management by telemedicine and the availability of in person appointments. The patient expressed understanding and agreed to proceed.    History of Present Illness: [name] is a 67 y.o. who identifies as a female who was assigned female at birth, and is being seen today for URI symptoms. Symptoms starting last week with nasal and head congestion, chest congestion and cough that is persistent. Notes husband positive for COVID last week.She has tested a few times and always negative. Is now noting increased congestion, wheezing and chest tightness. Last use of her albuterol was this morning.   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   Asthma due to environmental allergies 01/15/2022   Other mixed anxiety disorders 11/29/2021   Iron deficiency anemia secondary to inadequate dietary iron intake 01/23/2021   Other fatigue 01/23/2021   Annual physical exam 01/23/2021   S/P total knee arthroplasty, right 11/28/2020   Hammer toe 10/14/2017    Allergies:  Allergies  Allergen Reactions   Mango Flavor [Flavoring Agent] Anaphylaxis    Specifically mango peel , patient can eat the fruit itself   Oxycontin [Oxycodone]     Dizziness   Medications:  Current Outpatient Medications:    benzonatate (TESSALON) 100 MG  capsule, Take 1 capsule (100 mg total) by mouth 3 (three) times daily as needed for cough., Disp: 30 capsule, Rfl: 0   predniSONE (DELTASONE) 20 MG tablet, Take 3 tablets (60 mg total) by mouth daily with breakfast for 5 days., Disp: 15 tablet, Rfl: 0   albuterol (VENTOLIN HFA) 108 (90 Base) MCG/ACT inhaler, Inhale 2 puffs into the lungs every 6 (six) hours as needed for wheezing or shortness of breath.,  Disp: 1 each, Rfl: 3   ALPRAZolam (XANAX) 0.25 MG tablet, TAKE 1/2 TO 1 TABLET BY MOUTH UP TO TWO TIMES A DAY AS NEEDED FOR PANIC Lethea Killings, Disp: 20 tablet, Rfl: 2   Ascorbic Acid (VITAMIN C) 1000 MG tablet, Take 1,000 mg by mouth daily., Disp: , Rfl:    b complex vitamins capsule, Take 1 capsule by mouth daily., Disp: , Rfl:    Benzocaine (SOLARCAINE EX), Apply 1 application. topically daily as needed (bug bites)., Disp: , Rfl:    Budeson-Glycopyrrol-Formoterol (BREZTRI AEROSPHERE) 160-9-4.8 MCG/ACT AERO, INHALE TWO PUFFS BY MOUTH EVERY MORNING AND INHALE TWO PUFFS BY MOUTH EVERY NIGHT AT BEDTIME, Disp: 10.7 g, Rfl: 0   busPIRone (BUSPAR) 7.5 MG tablet, TAKE 1 TABLET BY MOUTH TWICE A DAY, Disp: 60 tablet, Rfl: 3   cetirizine (ZYRTEC) 10 MG tablet, Take 1 tablet (10 mg total) by mouth 2 (two) times daily., Disp: 180 tablet, Rfl: 3   Cholecalciferol (VITAMIN D) 125 MCG (5000 UT) CAPS, Take 5,000 Units by mouth daily., Disp: , Rfl:    fluticasone (FLONASE) 50 MCG/ACT nasal spray, Place 1 spray into both nostrils daily., Disp: 16 g, Rfl: 6   hydrochlorothiazide (HYDRODIURIL) 25 MG tablet, Take 1 tablet (25 mg total) by mouth every morning., Disp: 90 tablet, Rfl: 3   levothyroxine (SYNTHROID) 200 MCG tablet, Take 1 tablet (200 mcg total) by mouth daily., Disp: 90 tablet, Rfl: 3   magnesium oxide (MAG-OX) 400 MG tablet, Take 400 mg by mouth daily., Disp: , Rfl:    sertraline (ZOLOFT) 100 MG tablet, TAKE 1.5 TABLETS BY MOUTH EVERY MORNING, Disp: 135 tablet, Rfl: 3  Pulse 67   LMP 08/08/2010   SpO2 98% Comment: Room air  Observations/Objective: Patient is well-developed, well-nourished in no acute distress.  Resting comfortably at home.  Head is normocephalic, atraumatic.  No labored breathing. Speech is clear and coherent with logical content.  Patient is alert and oriented at baseline.   Assessment and Plan: 1. Viral bronchitis - predniSONE (DELTASONE) 20 MG tablet; Take 3 tablets (60 mg  total) by mouth daily with breakfast for 5 days.  Dispense: 15 tablet; Refill: 0 - benzonatate (TESSALON) 100 MG capsule; Take 1 capsule (100 mg total) by mouth 3 (three) times daily as needed for cough.  Dispense: 30 capsule; Refill: 0  2. Moderate persistent asthma with exacerbation - predniSONE (DELTASONE) 20 MG tablet; Take 3 tablets (60 mg total) by mouth daily with breakfast for 5 days.  Dispense: 15 tablet; Refill: 0  Supportive measures and OTC medications reviewed. Concern for viral bronchitis secondary to COVID-19 giving very close exposure despite her negative tests (concern for false negative). Outside time frame for antiviral. Will add on Tessalon and prednisone burst per orders. Will have her use her nebulizer in lieu of her rescue inhaler for the next 24 hours. Strict ER precautions reviewed.   Follow Up Instructions: I discussed the assessment and treatment plan with the patient. The patient was provided an opportunity to ask questions and all were answered. The patient  agreed with the plan and demonstrated an understanding of the instructions.  A copy of instructions were sent to the patient via MyChart unless otherwise noted below.   The patient was advised to call back or seek an in-person evaluation if the symptoms worsen or if the condition fails to improve as anticipated.  Time:  I spent 10 minutes with the patient via telehealth technology discussing the above problems/concerns.    Piedad Climes, PA-C

## 2022-12-09 DIAGNOSIS — M25551 Pain in right hip: Secondary | ICD-10-CM | POA: Diagnosis not present

## 2023-01-17 ENCOUNTER — Ambulatory Visit
Admission: RE | Admit: 2023-01-17 | Discharge: 2023-01-17 | Disposition: A | Discharge status: Home / Self Care | Payer: Medicare PPO | Source: Ambulatory Visit | Attending: Internal Medicine | Admitting: Internal Medicine

## 2023-01-17 ENCOUNTER — Ambulatory Visit: Payer: Medicare PPO | Admitting: Internal Medicine

## 2023-01-17 ENCOUNTER — Encounter: Payer: Self-pay | Admitting: Internal Medicine

## 2023-01-17 VITALS — BP 120/80 | HR 68 | Ht 64.5 in | Wt 207.0 lb

## 2023-01-17 DIAGNOSIS — R252 Cramp and spasm: Secondary | ICD-10-CM

## 2023-01-17 DIAGNOSIS — E890 Postprocedural hypothyroidism: Secondary | ICD-10-CM

## 2023-01-17 DIAGNOSIS — E041 Nontoxic single thyroid nodule: Secondary | ICD-10-CM | POA: Diagnosis not present

## 2023-01-17 DIAGNOSIS — Z85850 Personal history of malignant neoplasm of thyroid: Secondary | ICD-10-CM

## 2023-01-17 LAB — PHOSPHORUS: Phosphorus: 3.9 mg/dL (ref 2.3–4.6)

## 2023-01-17 LAB — TSH: TSH: 0.94 u[IU]/mL (ref 0.35–5.50)

## 2023-01-17 LAB — VITAMIN D 25 HYDROXY (VIT D DEFICIENCY, FRACTURES): VITD: 45.99 ng/mL (ref 30.00–100.00)

## 2023-01-17 LAB — MAGNESIUM: Magnesium: 2.2 mg/dL (ref 1.5–2.5)

## 2023-01-17 MED ORDER — LEVOTHYROXINE SODIUM 200 MCG PO TABS: 200.0000 ug | ORAL_TABLET | ORAL | 3 refills | Status: DC

## 2023-01-17 NOTE — Progress Notes (Signed)
Name: [name]  MRN/ DOB: [id number], November 29, 1955    Age/ Sex: 67 y.o., female    PCP: Early, Sung Amabile, NP   Reason for Endocrinology Evaluation: MNG     Date of Initial Endocrinology Evaluation: 01/17/2023     HPI: [name] is a 67 y.o. female with a past medical history of Asthma, MNG. The patient presented for initial endocrinology clinic visit on 01/17/2023 for consultative assistance with her MNG.    During evaluation for chronic anemia she was seen by GI and was noted to have a hiatal hernia.  A CT scan was ordered for further evaluation of hiatal hernia and the patient had an incidental 2.2 cm left thyroid nodule, which prompted a thyroid ultrasound showing multinodular goiter.  Per patient she has been diagnosed with multinodular goiter in 2002 She was on LT-for replacement for many years before this was stopped by a physician    She is S/P FNA of the right mid 4.5 cm nodule with a cytology report consistent with suspicious for malignancy ( Bethesda Category V) . 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  09/2021: Tg 0.1 (TgAb <1) 01/2022: Tg 0.2 (TgAB) < 1) 01/2022: [place] right resection bed hypoechoic 5 x 6 x 5 mm, left resection bed 12 x 5 x 5 mm    Switched to Levothyroxine at night, as that has worked better for her    SUBJECTIVE:   Today (01/17/23):  [name] is here for a follow up on PTC.    She is S/P total thyroidectomy 07/05/2021   Pt has been noted with weight gain  Denies local neck swelling  Denies constipation or diarrhea  Denies palpitations  Denies tremors  No biotin  Has been more tired than usual Has cramps   Levothyroxine 200 mg ,  1 tablet Monday through Saturday , skips Sundays   She is on MVI Vitamin D 2000 international unit    HISTORY:  Past Medical History:  Past Medical History:  Diagnosis Date   Anemia    Anxiety    Arthritis    Asthma    Cancer (HCC) 2023   Thyroid   Depression    Dyspnea    GERD (gastroesophageal reflux disease)    Goiter    History of hiatal hernia    Hypertension    Hyperthyroidism    Pain in joint of left shoulder 12/11/2021   Past Surgical History:  Past Surgical History:  Procedure Laterality Date   CHOLECYSTECTOMY     FOOT SURGERY     HAMMER TOE SURGERY Bilateral    All toes on both feet   KNEE SURGERY Left    TKA x2    1974, 2014   SHOULDER SURGERY Bilateral    Left Hemiarthroplasty,  total arthroplasty on Right shoulder   THYROIDECTOMY N/A 07/05/2021   Procedure: TOTAL THYROIDECTOMY;  Surgeon: Rodman Pickle, MD;  Location: [facility];  Service: General;  Laterality: N/A;   TOTAL HIP ARTHROPLASTY Left    TOTAL KNEE ARTHROPLASTY Right 11/28/2020   Procedure: TOTAL KNEE ARTHROPLASTY;  Surgeon: Durene Romans, MD;  Location: [facility];  Service: Orthopedics;  Laterality: Right;   TOTAL SHOULDER ARTHROPLASTY Left 02/08/2022   Procedure: SHOULDER REVISION TO TOTAL SHOULDER  ARTHROPLASTY;  Surgeon: Beverely Low, MD;  Location: [facility];  Service: Orthopedics;  Laterality: Left;  120 MIN CHOICE WITH INTERSCALENE BLOCK   WISDOM TOOTH EXTRACTION     XI ROBOTIC ASSISTED HIATAL HERNIA REPAIR N/A 08/20/2021   Procedure: XI ROBOTIC ASSISTED HIATAL HERNIA REPAIR WITH PARTIAL FUNDOPLICATION AND EGD;  Surgeon: [name], De Blanch, MD;  Location: [facility];  Service: General;  Laterality: N/A;    Social History:  reports that she quit smoking about 50 years ago. Her smoking use included cigarettes. She started smoking about 54 years ago. She has a 4 pack-year smoking history. She has never used smokeless tobacco. She reports current alcohol use. She reports that she does not use drugs. Family History: family history includes Breast cancer in her maternal grandmother; Emphysema in her  paternal grandmother; Heart disease in her mother.   HOME MEDICATIONS: Allergies as of 01/17/2023       Reactions   Mango Flavor [flavoring Agent] Anaphylaxis   Specifically mango peel , patient can eat the fruit itself   Oxycontin [oxycodone]    Dizziness        Medication List        Accurate as of January 17, 2023  7:58 AM. If you have any questions, ask your nurse or doctor.          STOP taking these medications    benzonatate 100 MG capsule Commonly known as: TESSALON Stopped by: Johnney Ou Annmargaret Decaprio       TAKE these medications    albuterol 108 (90 Base) MCG/ACT inhaler Commonly known as: VENTOLIN HFA Inhale 2 puffs into the lungs every 6 (six) hours as needed for wheezing or shortness of breath.   ALPRAZolam 0.25 MG tablet Commonly known as: XANAX TAKE 1/2 TO 1 TABLET BY MOUTH UP TO TWO TIMES A DAY AS NEEDED FOR PANIC /ANXIETY   b complex vitamins capsule Take 1 capsule by mouth daily.   Breztri Aerosphere 160-9-4.8 MCG/ACT Aero Generic drug: Budeson-Glycopyrrol-Formoterol INHALE TWO PUFFS BY MOUTH EVERY MORNING AND INHALE TWO PUFFS BY MOUTH EVERY NIGHT AT BEDTIME   busPIRone 7.5 MG tablet Commonly known as: BUSPAR TAKE 1 TABLET BY MOUTH 2 TIMES A DAY   cetirizine 10 MG tablet Commonly known as: ZYRTEC Take 1 tablet (10 mg total) by mouth 2 (two) times daily.   fluticasone 50 MCG/ACT nasal spray Commonly known as: FLONASE Place 1 spray into both nostrils daily.   hydrochlorothiazide 25 MG tablet Commonly known as: HYDRODIURIL Take 1 tablet (25 mg total) by mouth every morning.   levothyroxine 200 MCG tablet Commonly known as: SYNTHROID Take 1 tablet (200 mcg total) by mouth daily.   magnesium oxide 400 MG tablet Commonly known as: MAG-OX Take 400 mg by mouth daily.   sertraline 100 MG tablet Commonly known as: ZOLOFT TAKE 1.5 TABLETS BY MOUTH EVERY MORNING   SOLARCAINE EX Apply 1 application. topically daily as needed (bug  bites).   vitamin C 1000 MG tablet Take 1,000 mg by mouth daily.   Vitamin D 125 MCG (5000 UT) Caps Take 5,000 Units by mouth daily.          REVIEW OF SYSTEMS: A comprehensive ROS was conducted with the patient and is negative except as per HPI    OBJECTIVE:  VS:BP 120/80 (BP Location: Left Arm, Patient Position: Sitting, Cuff Size: Large)   Pulse 68   Ht 5' 4.5" (1.638 m)   Wt 207 lb (93.9 kg)   LMP 08/08/2010  SpO2 97%   BMI 34.98 kg/m    Wt Readings from Last 3 Encounters:  01/17/23 207 lb (93.9 kg)  07/17/22 195 lb 9.6 oz (88.7 kg)  03/28/22 199 lb 3.2 oz (90.4 kg)     EXAM: General: Pt appears well and is in NAD  Neck: General: Supple without adenopathy. Thyroid: no nodules appreciated   Lungs: Clear with good BS bilat   Heart: Auscultation: RRR.  Abdomen: soft, nontender  Extremities:  BL LE: No pretibial edema   Mental Status: Judgment, insight: Intact Orientation: Oriented to time, place, and person Mood and affect: No depression, anxiety, or agitation     DATA REVIEWED:   Latest Reference Range & Units 01/17/23 08:11  Phosphorus 2.3 - 4.6 mg/dL 3.9  Magnesium 1.5 - 2.5 mg/dL 2.2  VITD 96.29 - 528.41 ng/mL 45.99    Latest Reference Range & Units 01/17/23 08:11  TSH 0.35 - 5.50 uIU/mL 0.94     SURGICAL PATHOLOGY 07/05/2021  A. THYROID, TOTAL THYROIDECTOMY:  Papillary carcinoma, oncocytic variant, of right lobe (see comment)  Tumor measures 3.8 x 3.1 x 2.1 cm  Margins free  Background of multinodular goiter with dominant nodule within the left  lobe   Comment:  Sections of the right thyroid show a lobulated circumscribed thinly  encapsulated nodule composed of variably sized follicular groups showing  follicular cells with abundant granular cytoplasm all within a fibrotic  focally hyalinized fibrovascular stroma.  These cells show a variable  but moderate nuclear enlargement and cytologic atypia.  Many of the  cells show enlarged  nuclei with clearing and margination of the nuclear  chromatin and scattered small inconspicuous nucleoli.  Although no  overlap is seen, the cells show frequent nuclear grooves, and nuclear  pseudoinclusions are readily identified.  There is scant associated  colloid.  Scattered calcifications and psammoma bodies are seen.  These  findings are consistent with a papillary carcinoma with oncocytic  features.   ONCOLOGY TABLE   THYROID GLAND, CARCINOMA: Resection   Procedure: Total thyroidectomy  Tumor Focality: Unifocal  Tumor Site: Right lobe  Tumor Size: 3.8 x 3.1 x 2.1 cm  Histologic Type: Papillary carcinoma, oncocytic variant  Angioinvasion: Not identified  Lymphatic Invasion: Not identified  Extrathyroidal Extension: Not identified  Margin Status: All margins negative for invasive carcinoma  Regional Lymph Node Status: Not applicable (no regional lymph nodes  submitted or found)  Distant Metastasis: Not applicable  Pathologic Stage Classification (pTNM, AJCC 8th Edition): pT2, pN n/a    Thyroid bed ultrasound 07/24/2022  FINDINGS: There are postsurgical changes of total thyroidectomy. In the right thyroid resection bed, note is made of a small hypoechoic area measuring 0.4 x 0.4 x 0.3 cm, previously 0.6 x 0.5 x 0 5 cm. Evaluation of the left thyroid resection bed demonstrates no abnormal soft tissue. No abnormal lymphadenopathy identified in the adjacent soft tissues of the neck.   IMPRESSION: 1. Postsurgical changes of total thyroidectomy. 2. In the right surgical resection bed, there is a hypoechoic area that measures up to 0.4 cm, previously 0.6 cm. It appears less conspicuous and slightly decreased in size on today's exam. Attention on continued follow-up. 3. Previously described tissue in the left thyroid resection bed is no longer visualized on today's exam. 4. No lymphadenopathy in the adjacent soft tissues of the neck.   ASSESSMENT/PLAN/RECOMMENDATIONS:    Papillary thyroid carcinoma:   -Patient is S/P t total thyroidectomy on Jul 05, 2021, oncocytic variant, unifocal, 3.8 cm at max  diameter, angioinvasion not identified, NO lymph nodes where submitted -Given oncocytic variant and in review of the literature there has been significant controversy about the biological behavior and optimal management of oncocytic- PTC variant but overall they have similar prognosis to non- oncocytic PTC.  - RAI ablation use in the setting of oncocytic PTC is variable due to the suggestion that there is limited uptake of RAI and therefore limited benefit -TSH goal 0.5-2 u IU/mL -We will proceed with TG, TG AB -Thyroid bed ultrasound 07/2022 showed decrease in size of the right thyroid bed nodule, with disappearance of the left thyroid bed nodule -Will repeat thyroid bed ultrasound  2.  Postoperative hypothyroidism  -Patient is clinically euthyroid -TSH within normal range Medication  Continue  levothyroxine 200 mcg, 1 tablet Monday through Saturday and none on Sundays  3. Muscle cramps :  - Mg, phosphorus , and vitamin D are normal    F/U in 6 months    Signed electronically by: Lyndle Herrlich, MD  [facility] Endocrinology  [facility] [address] [place] [address] [place], [place] [address] Phone: [phone] FAX: [phone]   CC: Tollie Eth, NP [address] [place] [address] Phone: [phone] Fax: [phone]   Return to Endocrinology clinic as below: Future Appointments  Date Time Provider Department Center  01/28/2023  8:15 AM [facility]-ANNUAL WELLNESS VISIT [facility]  02/13/2023  1:45 PM Early, Sung Amabile, NP [facility]     And vitamin D are normal

## 2023-01-20 LAB — THYROGLOBULIN LEVEL: Thyroglobulin: 0.1 ng/mL — ABNORMAL LOW

## 2023-01-20 LAB — THYROGLOBULIN ANTIBODY: Thyroglobulin Ab: <1 [IU]/mL (ref <=1)

## 2023-01-20 LAB — PTH, INTACT AND CALCIUM
Calcium: 9.3 mg/dL (ref 8.6–10.4)
PTH: 50 pg/mL (ref 16–77)

## 2023-01-28 ENCOUNTER — Ambulatory Visit: Payer: Medicare PPO

## 2023-01-28 DIAGNOSIS — Z0000 Encounter for general adult medical examination without abnormal findings: Secondary | ICD-10-CM | POA: Diagnosis not present

## 2023-01-28 NOTE — Progress Notes (Signed)
Subjective:   [name] is a 67 y.o. female who presents for Medicare Annual (Subsequent) preventive examination.  Visit Complete: Virtual I connected with  Brennan Bailey on 01/28/23 by a audio enabled telemedicine application and verified that I am speaking with the correct person using two identifiers.  Patient Location: Home  Provider Location: Office/Clinic  I discussed the limitations of evaluation and management by telemedicine. The patient expressed understanding and agreed to proceed.  Vital Signs: Because this visit was a virtual/telehealth visit, some criteria may be missing or patient reported. Any vitals not documented were not able to be obtained and vitals that have been documented are patient reported.  Patient Medicare AWV questionnaire was completed by the patient on 01/27/2023; I have confirmed that all information answered by patient is correct and no changes since this date.  Cardiac Risk Factors include: advanced age (>83men, >66 women);hypertension     Objective:    Today's Vitals   01/28/23 0811  PainSc: 2    There is no height or weight on file to calculate BMI.     01/28/2023    8:16 AM 02/08/2022    7:47 PM 01/31/2022    8:14 AM 08/20/2021   10:49 AM 08/08/2021   10:11 AM 07/05/2021    3:10 PM 07/05/2021    3:00 PM  Advanced Directives  Does Patient Have a Medical Advance Directive? Yes Yes Yes Yes Yes  No  Type of Estate agent of [place];Living will Healthcare Power of [place];Living will  Healthcare Power of [place];Living will Living will;Healthcare Power of Attorney    Does patient want to make changes to medical advance directive?  No - Patient declined No - Patient declined No - Patient declined     Copy of Healthcare Power of Attorney in Chart? No - copy requested No - copy requested  No - copy requested     Would patient like information on creating a medical advance directive?      No - Patient declined      Current Medications (verified) Outpatient Encounter Medications as of 01/28/2023  Medication Sig   albuterol (VENTOLIN HFA) 108 (90 Base) MCG/ACT inhaler Inhale 2 puffs into the lungs every 6 (six) hours as needed for wheezing or shortness of breath.   ALPRAZolam (XANAX) 0.25 MG tablet TAKE 1/2 TO 1 TABLET BY MOUTH UP TO TWO TIMES A DAY AS NEEDED FOR PANIC /ANXIETY   Ascorbic Acid (VITAMIN C) 1000 MG tablet Take 1,000 mg by mouth daily.   b complex vitamins capsule Take 1 capsule by mouth daily.   Benzocaine (SOLARCAINE EX) Apply 1 application. topically daily as needed (bug bites).   Budeson-Glycopyrrol-Formoterol (BREZTRI AEROSPHERE) 160-9-4.8 MCG/ACT AERO INHALE TWO PUFFS BY MOUTH EVERY MORNING AND INHALE TWO PUFFS BY MOUTH EVERY NIGHT AT BEDTIME   busPIRone (BUSPAR) 7.5 MG tablet TAKE 1 TABLET BY MOUTH 2 TIMES A DAY   cetirizine (ZYRTEC) 10 MG tablet Take 1 tablet (10 mg total) by mouth 2 (two) times daily.   Cholecalciferol (VITAMIN D) 125 MCG (5000 UT) CAPS Take 5,000 Units by mouth daily.   fluticasone (FLONASE) 50 MCG/ACT nasal spray Place 1 spray into both nostrils daily.   hydrochlorothiazide (HYDRODIURIL) 25 MG tablet Take 1 tablet (25 mg total) by mouth every morning.   levothyroxine (SYNTHROID) 200 MCG tablet Take 1 tablet (200 mcg total) by mouth as directed.   magnesium oxide (MAG-OX) 400 MG tablet Take 400 mg by mouth daily.  sertraline (ZOLOFT) 100 MG tablet TAKE 1.5 TABLETS BY MOUTH EVERY MORNING   No facility-administered encounter medications on file as of 01/28/2023.    Allergies (verified) Mango flavor [flavoring agent] and Oxycontin [oxycodone]   History: Past Medical History:  Diagnosis Date   Anemia    Anxiety    Arthritis    Asthma    Cancer (HCC) 2023   Thyroid   Depression    Dyspnea    GERD (gastroesophageal reflux disease)    Goiter    History of hiatal hernia    Hypertension    Hyperthyroidism    Pain in joint of left shoulder 12/11/2021    Past Surgical History:  Procedure Laterality Date   CHOLECYSTECTOMY     FOOT SURGERY     HAMMER TOE SURGERY Bilateral    All toes on both feet   KNEE SURGERY Left    TKA x2    1974, 2014   SHOULDER SURGERY Bilateral    Left Hemiarthroplasty,  total arthroplasty on Right shoulder   THYROIDECTOMY N/A 07/05/2021   Procedure: TOTAL THYROIDECTOMY;  Surgeon: Rodman Pickle, MD;  Location: [facility];  Service: General;  Laterality: N/A;   TOTAL HIP ARTHROPLASTY Left    TOTAL KNEE ARTHROPLASTY Right 11/28/2020   Procedure: TOTAL KNEE ARTHROPLASTY;  Surgeon: Durene Romans, MD;  Location: [facility];  Service: Orthopedics;  Laterality: Right;   TOTAL SHOULDER ARTHROPLASTY Left 02/08/2022   Procedure: SHOULDER REVISION TO TOTAL SHOULDER ARTHROPLASTY;  Surgeon: Beverely Low, MD;  Location: [facility];  Service: Orthopedics;  Laterality: Left;  120 MIN CHOICE WITH INTERSCALENE BLOCK   WISDOM TOOTH EXTRACTION     XI ROBOTIC ASSISTED HIATAL HERNIA REPAIR N/A 08/20/2021   Procedure: XI ROBOTIC ASSISTED HIATAL HERNIA REPAIR WITH PARTIAL FUNDOPLICATION AND EGD;  Surgeon: [name], De Blanch, MD;  Location: [facility];  Service: General;  Laterality: N/A;   Family History  Problem Relation Age of Onset   Heart disease Mother    Breast cancer Maternal Grandmother    Emphysema Paternal Grandmother        never smoked but chewed tobacco   Social History   Socioeconomic History   Marital status: Married    Spouse name: Not on file   Number of children: Not on file   Years of education: Not on file   Highest education level: Not on file  Occupational History   Not on file  Tobacco Use   Smoking status: Former    Current packs/day: 0.00    Average packs/day: 1 pack/day for 4.0 years (4.0 ttl pk-yrs)    Types: Cigarettes    Start date: 42    Quit date: 34    Years since quitting: 50.9   Smokeless tobacco: Never  Vaping Use   Vaping status: Never Used  Substance and Sexual Activity    Alcohol use: Yes    Comment: Wine occasional   Drug use: No   Sexual activity: Not Currently    Birth control/protection: Post-menopausal  Other Topics Concern   Not on file  Social History Narrative   Not on file   Social Determinants of Health   Financial Resource Strain: Low Risk  (01/27/2023)   Overall Financial Resource Strain (CARDIA)    Difficulty of Paying Living Expenses: Not hard at all  Food Insecurity: No Food Insecurity (01/27/2023)   Hunger Vital Sign    Worried About Running Out of Food in the Last Year: Never true    Ran Out of  Food in the Last Year: Never true  Transportation Needs: No Transportation Needs (01/27/2023)   PRAPARE - Administrator, Civil Service (Medical): No    Lack of Transportation (Non-Medical): No  Physical Activity: Insufficiently Active (01/27/2023)   Exercise Vital Sign    Days of Exercise per Week: 2 days    Minutes of Exercise per Session: 60 min  Stress: Stress Concern Present (01/27/2023)   [employer] of Occupational Health - Occupational Stress Questionnaire    Feeling of Stress : To some extent  Social Connections: Unknown (01/27/2023)   Social Connection and Isolation Panel [NHANES]    Frequency of Communication with Friends and Family: More than three times a week    Frequency of Social Gatherings with Friends and Family: Twice a week    Attends Religious Services: Not on Marketing executive or Organizations: Yes    Attends Engineer, structural: More than 4 times per year    Marital Status: Married    Tobacco Counseling Counseling given: Not Answered   Clinical Intake:  Pre-visit preparation completed: Yes  Pain : 0-10 Pain Score: 2  Pain Type: Chronic pain Pain Location: Hip Pain Orientation: Right Pain Descriptors / Indicators: Aching Pain Onset: More than a month ago Pain Frequency: Intermittent     Nutritional Risks: None Diabetes: No  How often do you need to have  someone help you when you read instructions, pamphlets, or other written materials from your doctor or pharmacy?: 1 - Never  Interpreter Needed?: No  Information entered by :: NAllen LPN   Activities of Daily Living    01/27/2023    5:03 PM 02/08/2022    7:47 PM  In your present state of health, do you have any difficulty performing the following activities:  Hearing? 0 0  Vision? 0 0  Difficulty concentrating or making decisions? 0 0  Walking or climbing stairs? 1 0  Comment due to hip and age   Dressing or bathing? 0 1  Doing errands, shopping? 0 0  Preparing Food and eating ? N   Using the Toilet? N   In the past six months, have you accidently leaked urine? N   Do you have problems with loss of bowel control? N   Managing your Medications? N   Managing your Finances? N   Housekeeping or managing your Housekeeping? Y     Patient Care Team: Early, Sung Amabile, NP as PCP - General (Nurse Practitioner) Jodelle Red, MD as PCP - Cardiology (Cardiology)  Indicate any recent Medical Services you may have received from other than [facility] providers in the past year (date may be approximate).     Assessment:   This is a routine wellness examination for Tarynn.  Hearing/Vision screen Hearing Screening - Comments:: Denies hearing issues Vision Screening - Comments:: Regular eye exams, [facility]   Goals Addressed             This Visit's Progress    Patient Stated       01/28/2023, wants stamina to increase       Depression Screen    01/28/2023    8:17 AM 03/28/2022   12:07 PM 01/04/2022   11:10 AM 11/16/2021    8:43 AM 01/23/2021   12:11 PM  PHQ 2/9 Scores  PHQ - 2 Score 1 0 0 0 0  PHQ- 9 Score 3  0 0 3    Fall Risk    01/27/2023  5:03 PM 03/28/2022   12:06 PM 12/31/2021   10:04 AM 11/16/2021    8:43 AM 01/23/2021   12:11 PM  Fall Risk   Falls in the past year? 1 1 0 0 1  Comment tripped over a rug      Number falls in past yr: 0 0 0 0 0  Injury  with Fall? 0 1 0 0 0  Risk for fall due to : Medication side effect Impaired balance/gait  No Fall Risks Orthopedic patient  Follow up Falls prevention discussed;Falls evaluation completed Education provided;Falls prevention discussed  Falls evaluation completed;Education provided Falls evaluation completed;Education provided    MEDICARE RISK AT HOME: Medicare Risk at Home Any stairs in or around the home?: Yes Home free of loose throw rugs in walkways, pet beds, electrical cords, etc?: No Adequate lighting in your home to reduce risk of falls?: Yes Life alert?: No Use of a cane, walker or w/c?: No Grab bars in the bathroom?: No Shower chair or bench in shower?: No Elevated toilet seat or a handicapped toilet?: Yes  TIMED UP AND GO:  Was the test performed?  No    Cognitive Function:        01/28/2023    8:19 AM 01/04/2022   11:17 AM  6CIT Screen  What Year? 0 points 0 points  What month? 0 points 0 points  What time? 0 points 0 points  Count back from 20 0 points 0 points  Months in reverse 0 points 0 points  Repeat phrase 0 points 0 points  Total Score 0 points 0 points    Immunizations Immunization History  Administered Date(s) Administered   Influenza,inj,Quad PF,6+ Mos 11/16/2021   Influenza-Unspecified 12/18/2020   Pfizer Covid-19 Vaccine Bivalent Booster 3yrs & up 12/18/2020    TDAP status: Due, Education has been provided regarding the importance of this vaccine. Advised may receive this vaccine at local pharmacy or Health Dept. Aware to provide a copy of the vaccination record if obtained from local pharmacy or Health Dept. Verbalized acceptance and understanding.  Flu Vaccine status: Due, Education has been provided regarding the importance of this vaccine. Advised may receive this vaccine at local pharmacy or Health Dept. Aware to provide a copy of the vaccination record if obtained from local pharmacy or Health Dept. Verbalized acceptance and  understanding.  Pneumococcal vaccine status: Due, Education has been provided regarding the importance of this vaccine. Advised may receive this vaccine at local pharmacy or Health Dept. Aware to provide a copy of the vaccination record if obtained from local pharmacy or Health Dept. Verbalized acceptance and understanding.  Covid-19 vaccine status: Information provided on how to obtain vaccines.   Qualifies for Shingles Vaccine? Yes   Zostavax completed No   Shingrix Completed?: No.    Education has been provided regarding the importance of this vaccine. Patient has been advised to call insurance company to determine out of pocket expense if they have not yet received this vaccine. Advised may also receive vaccine at local pharmacy or Health Dept. Verbalized acceptance and understanding.  Screening Tests Health Maintenance  Topic Date Due   DTaP/Tdap/Td (1 - Tdap) Never done   Zoster Vaccines- Shingrix (1 of 2) Never done   Pneumonia Vaccine 61+ Years old (1 of 1 - PCV) Never done   COVID-19 Vaccine (2 - Pfizer risk series) 01/08/2021   INFLUENZA VACCINE  10/03/2022   Medicare Annual Wellness (AWV)  01/28/2024   MAMMOGRAM  03/05/2024   Colonoscopy  03/23/2031   DEXA SCAN  Completed   HPV VACCINES  Aged Out   Hepatitis C Screening  Discontinued    Health Maintenance  Health Maintenance Due  Topic Date Due   DTaP/Tdap/Td (1 - Tdap) Never done   Zoster Vaccines- Shingrix (1 of 2) Never done   Pneumonia Vaccine 66+ Years old (1 of 1 - PCV) Never done   COVID-19 Vaccine (2 - Pfizer risk series) 01/08/2021   INFLUENZA VACCINE  10/03/2022    Colorectal cancer screening: Type of screening: Colonoscopy. Completed 03/22/2021. Repeat every 10 years  Mammogram status: Completed 03/05/2022. Repeat every year  Bone Density status: Completed 02/03/2020.   Lung Cancer Screening: (Low Dose CT Chest recommended if Age 25-80 years, 20 pack-year currently smoking OR have quit w/in 15years.) does  not qualify.   Lung Cancer Screening Referral: no  Additional Screening:  Hepatitis C Screening: does not qualify;   Vision Screening: Recommended annual ophthalmology exams for early detection of glaucoma and other disorders of the eye. Is the patient up to date with their annual eye exam?  Yes  Who is the provider or what is the name of the office in which the patient attends annual eye exams? [facility] If pt is not established with a provider, would they like to be referred to a provider to establish care? No .   Dental Screening: Recommended annual dental exams for proper oral hygiene  Diabetic Foot Exam: n/a  Community Resource Referral / Chronic Care Management: CRR required this visit?  No   CCM required this visit?  No     Plan:     I have personally reviewed and noted the following in the patient's chart:   Medical and social history Use of alcohol, tobacco or illicit drugs  Current medications and supplements including opioid prescriptions. Patient is not currently taking opioid prescriptions. Functional ability and status Nutritional status Physical activity Advanced directives List of other physicians Hospitalizations, surgeries, and ER visits in previous 12 months Vitals Screenings to include cognitive, depression, and falls Referrals and appointments  In addition, I have reviewed and discussed with patient certain preventive protocols, quality metrics, and best practice recommendations. A written personalized care plan for preventive services as well as general preventive health recommendations were provided to patient.     Barb Merino, LPN   29/56/2130   After Visit Summary: (MyChart) Due to this being a telephonic visit, the after visit summary with patients personalized plan was offered to patient via MyChart   Nurse Notes: none

## 2023-01-28 NOTE — Patient Instructions (Signed)
[name] , Thank you for taking time to come for your Medicare Wellness Visit. I appreciate your ongoing commitment to your health goals. Please review the following plan we discussed and let me know if I can assist you in the future.   Referrals/Orders/Follow-Ups/Clinician Recommendations: none  This is a list of the screening recommended for you and due dates:  Health Maintenance  Topic Date Due   DTaP/Tdap/Td vaccine (1 - Tdap) Never done   Zoster (Shingles) Vaccine (1 of 2) Never done   Pneumonia Vaccine (1 of 1 - PCV) Never done   COVID-19 Vaccine (2 - Pfizer risk series) 01/08/2021   Flu Shot  10/03/2022   Medicare Annual Wellness Visit  01/28/2024   Mammogram  03/05/2024   Colon Cancer Screening  03/23/2031   DEXA scan (bone density measurement)  Completed   HPV Vaccine  Aged Out   Hepatitis C Screening  Discontinued    Advanced directives: (Copy Requested) Please bring a copy of your health care power of attorney and living will to the office to be added to your chart at your convenience.  Next Medicare Annual Wellness Visit scheduled for next year: Yes  Insert Preventive Care attachment Insert FALL PREVENTION attachment if needed

## 2023-02-13 ENCOUNTER — Ambulatory Visit: Payer: Medicare PPO | Admitting: Nurse Practitioner

## 2023-02-13 ENCOUNTER — Other Ambulatory Visit: Payer: Self-pay | Admitting: Nurse Practitioner

## 2023-02-13 ENCOUNTER — Encounter: Payer: Self-pay | Admitting: Nurse Practitioner

## 2023-02-13 VITALS — BP 120/78 | HR 72 | Wt 211.2 lb

## 2023-02-13 DIAGNOSIS — Z0000 Encounter for general adult medical examination without abnormal findings: Secondary | ICD-10-CM

## 2023-02-13 DIAGNOSIS — J4540 Moderate persistent asthma, uncomplicated: Secondary | ICD-10-CM | POA: Diagnosis not present

## 2023-02-13 DIAGNOSIS — I10 Essential (primary) hypertension: Secondary | ICD-10-CM

## 2023-02-13 DIAGNOSIS — F413 Other mixed anxiety disorders: Secondary | ICD-10-CM

## 2023-02-13 DIAGNOSIS — J45909 Unspecified asthma, uncomplicated: Secondary | ICD-10-CM | POA: Diagnosis not present

## 2023-02-13 DIAGNOSIS — Z23 Encounter for immunization: Secondary | ICD-10-CM | POA: Diagnosis not present

## 2023-02-13 MED ORDER — SERTRALINE HCL 100 MG PO TABS: ORAL_TABLET | ORAL | 3 refills | Status: DC

## 2023-02-13 MED ORDER — ALPRAZOLAM 0.25 MG PO TABS: ORAL_TABLET | ORAL | 2 refills | Status: DC

## 2023-02-13 MED ORDER — CETIRIZINE HCL 10 MG PO TABS: 10.0000 mg | ORAL_TABLET | Freq: Two times a day (BID) | ORAL | 3 refills | Status: DC

## 2023-02-13 MED ORDER — ALBUTEROL SULFATE HFA 108 (90 BASE) MCG/ACT IN AERS: 2.0000 | INHALATION_SPRAY | Freq: Four times a day (QID) | RESPIRATORY_TRACT | 3 refills | Status: DC | PRN

## 2023-02-13 MED ORDER — HYDROCHLOROTHIAZIDE 25 MG PO TABS: 25.0000 mg | ORAL_TABLET | Freq: Every morning | ORAL | 3 refills | Status: AC

## 2023-02-13 MED ORDER — BREZTRI AEROSPHERE 160-9-4.8 MCG/ACT IN AERO: INHALATION_SPRAY | RESPIRATORY_TRACT | 11 refills | Status: AC

## 2023-02-13 MED ORDER — BUSPIRONE HCL 7.5 MG PO TABS: 7.5000 mg | ORAL_TABLET | Freq: Two times a day (BID) | ORAL | 0 refills | Status: DC

## 2023-02-13 MED ORDER — FLUTICASONE PROPIONATE 50 MCG/ACT NA SUSP: 1.0000 | Freq: Every day | NASAL | 6 refills | Status: AC

## 2023-02-13 NOTE — Patient Instructions (Signed)
 For all adult patients, I recommend A well balanced diet low in saturated fats, cholesterol, and moderation in carbohydrates.   This can be as simple as monitoring portion sizes and cutting back on sugary beverages such as soda and juice to start with.    Daily water consumption of at least 64 ounces.  Physical activity at least 180 minutes per week, if just starting out.   This can be as simple as taking the stairs instead of the elevator and walking 2-3 laps around the office  purposefully every day.   STD protection, partner selection, and regular testing if high risk.  Limited consumption of alcoholic beverages if alcohol is consumed.  For women, I recommend no more than 7 alcoholic beverages per week, spread out throughout the week.  Avoid "binge" drinking or consuming large quantities of alcohol in one setting.   Please let me know if you feel you may need help with reduction or quitting alcohol consumption.   Avoidance of nicotine, if used.  Please let me know if you feel you may need help with reduction or quitting nicotine use.   Daily mental health attention.  This can be in the form of 5 minute daily meditation, prayer, journaling, yoga, reflection, etc.   Purposeful attention to your emotions and mental state can significantly improve your overall wellbeing  and  Health.  Please know that I am here to help you with all of your health care goals and am happy to work with you to find a solution that works best for you.  The greatest advice I have received with any changes in life are to take it one step at a time, that even means if all you can focus on is the next 60 seconds, then do that and celebrate your victories.  With any changes in life, you will have set backs, and that is OK. The important thing to remember is, if you have a set back, it is not a failure, it is an opportunity to try again!  Health Maintenance Recommendations Screening Testing Mammogram Every 1 -2  years based on history and risk factors Starting at age 40 Pap Smear Ages 21-39 every 3 years Ages 30-65 every 5 years with HPV testing More frequent testing may be required based on results and history Colon Cancer Screening Every 1-10 years based on test performed, risk factors, and history Starting at age 45 Bone Density Screening Every 2-10 years based on history Starting at age 65 for women Recommendations for men differ based on medication usage, history, and risk factors AAA Screening One time ultrasound Men 65-75 years old who have every smoked Lung Cancer Screening Low Dose Lung CT every 12 months Age 55-80 years with a 30 pack-year smoking history who still smoke or who have quit within the last 15 years  Screening Labs Routine  Labs: Complete Blood Count (CBC), Complete Metabolic Panel (CMP), Cholesterol (Lipid Panel) Every 6-12 months based on history and medications May be recommended more frequently based on current conditions or previous results Hemoglobin A1c Lab Every 3-12 months based on history and previous results Starting at age 45 or earlier with diagnosis of diabetes, high cholesterol, BMI >26, and/or risk factors Frequent monitoring for patients with diabetes to ensure blood sugar control Thyroid Panel (TSH w/ T3 & T4) Every 6 months based on history, symptoms, and risk factors May be repeated more often if on medication HIV One time testing for all patients 13 and older May be   repeated more frequently for patients with increased risk factors or exposure Hepatitis C One time testing for all patients 18 and older May be repeated more frequently for patients with increased risk factors or exposure Gonorrhea, Chlamydia Every 12 months for all sexually active persons 13-24 years Additional monitoring may be recommended for those who are considered high risk or who have symptoms PSA Men 40-54 years old with risk factors Additional screening may be  recommended from age 55-69 based on risk factors, symptoms, and history  Vaccine Recommendations Tetanus Booster All adults every 10 years Flu Vaccine All patients 6 months and older every year COVID Vaccine All patients 12 years and older Initial dosing with booster May recommend additional booster based on age and health history HPV Vaccine 2 doses all patients age 9-26 Dosing may be considered for patients over 26 Shingles Vaccine (Shingrix) 2 doses all adults 55 years and older Pneumonia (Pneumovax 23) All adults 65 years and older May recommend earlier dosing based on health history Pneumonia (Prevnar 13) All adults 65 years and older Dosed 1 year after Pneumovax 23  Additional Screening, Testing, and Vaccinations may be recommended on an individualized basis based on family history, health history, risk factors, and/or exposure.   

## 2023-02-13 NOTE — Progress Notes (Signed)
[name], DNP, AGNP-c [facility] [address] [place], [place] [address] Main Office [phone]  BP 120/78   Pulse 72   Wt 211 lb 3.2 oz (95.8 kg)   LMP 08/08/2010   BMI 35.69 kg/m    Subjective:    Patient ID: [name], female    DOB: 1955-08-15, 67 y.o.   MRN: [id number]  HPI: [name] is a 67 y.o. female presenting on 02/13/2023 for comprehensive medical examination.   The patient, an active individual with a history of asthma and multiple surgeries, presents with a chief complaint of decreased stamina, particularly noted during a walking program with her dogs. She reports a significant decrease in her ability to walk distances she previously managed with ease, now struggling to complete a mile and a half compared to her usual three to four miles. The patient attributes this to a respiratory issue, denying any relief from her inhaler. She suspects deconditioning due to her multiple surgeries in the past year as a possible cause.  In an effort to improve her stamina, the patient has engaged a Systems analyst and has been participating in a structured exercise program at a local gym for the past twelve weeks. She also continues to participate in a weekly walking group, which provides a supportive and positive environment.  The patient's asthma is reportedly managed by her general practitioner, as advised by her respiratory doctor. She requests refills for her medications, including sertraline, cetirizine, albuterol, alprazolam, and Breztri. She carries an inhaler with her during her gym workouts as a precaution.  The patient also reports muscle cramps in her hands, for which additional tests were conducted to rule out vitamin or mineral deficiencies. All results returned normal. She is due for a mammogram next month, which she will arrange independently.  The patient also expresses concerns about societal issues and their impact on her mental  health, specifically mentioning feelings of depression. She has taken steps to limit her exposure to negative influences, such as avoiding news and participating in positive group activities. She also expresses a desire to maintain her health and continue her active lifestyle.  Pertinent items are noted in HPI.  IMMUNIZATIONS:   Flu Vaccine: Flu vaccine given today Prevnar 13: Prevnar 13 N/A for this patient Prevnar 20: Prevnar 20 declined, patient will complete at a later date Pneumovax 23: Pneumovax 23 N/A for this patient Vac Shingrix: Shingrix declined, patient will complete at a later date HPV: N/A or Aged Out Tetanus: Tetanus declined, patient will complete at a later date COVID: COVID completed, documentation in chart  RSV: No  HEALTH MAINTENANCE: Pap Smear HM Status: N/A Mammogram HM Status: is already scheduled Colon Cancer Screening HM Status: is up to date Bone Density HM Status: is up to date STI Testing HM Status: was declined  Lung CT HM Status: N/A  Concerns with vision, hearing, or dentition: No  Most Recent Depression Screen:     02/13/2023    1:43 PM 01/28/2023    8:17 AM 03/28/2022   12:07 PM 01/04/2022   11:10 AM 11/16/2021    8:43 AM  Depression screen PHQ 2/9  Decreased Interest 2 0 0 0 0  Down, Depressed, Hopeless 2 1 0 0 0  PHQ - 2 Score 4 1 0 0 0  Altered sleeping 0 0  0 0  Tired, decreased energy 0 2  0 0  Change in appetite 0 0     Feeling bad or failure about yourself  0 0  0 0  Trouble concentrating 0 0  0 0  Moving slowly or fidgety/restless 0 0  0 0  Suicidal thoughts 0 0  0 0  PHQ-9 Score 4 3  0 0  Difficult doing work/chores Not difficult at all Not difficult at all      Most Recent Anxiety Screen:     01/23/2021   12:11 PM  GAD 7 : Generalized Anxiety Score  Nervous, Anxious, on Edge 2  Control/stop worrying 0  Worry too much - different things 0  Trouble relaxing 0  Restless 0  Easily annoyed or irritable 2  Afraid - awful  might happen 0  Total GAD 7 Score 4  Anxiety Difficulty Somewhat difficult   Most Recent Fall Screen:    02/13/2023    1:42 PM 01/27/2023    5:03 PM 03/28/2022   12:06 PM 12/31/2021   10:04 AM 11/16/2021    8:43 AM  Fall Risk   Falls in the past year? 1 1 1  0  0  Comment  tripped over a rug     Number falls in past yr: 0 0 0 0  0  Injury with Fall? 1 0 1 0  0  Comment fractured arm after surgery      Risk for fall due to : No Fall Risks Medication side effect Impaired balance/gait  No Fall Risks  Follow up Falls evaluation completed Falls prevention discussed;Falls evaluation completed Education provided;Falls prevention discussed  Falls evaluation completed;Education provided     Patient-reported    Past medical history, surgical history, medications, allergies, family history and social history reviewed with patient today and changes made to appropriate areas of the chart.  Past Medical History:  Past Medical History:  Diagnosis Date   Anemia    Anxiety    Arthritis    Asthma    Cancer (HCC) 2023   Thyroid   Depression    Dyspnea    GERD (gastroesophageal reflux disease)    Goiter    History of hiatal hernia    Hypertension    Hyperthyroidism    Pain in joint of left shoulder 12/11/2021   Medications:  Current Outpatient Medications on File Prior to Visit  Medication Sig   Ascorbic Acid (VITAMIN C) 1000 MG tablet Take 1,000 mg by mouth daily.   b complex vitamins capsule Take 1 capsule by mouth daily.   Benzocaine (SOLARCAINE EX) Apply 1 application. topically daily as needed (bug bites).   Cholecalciferol (VITAMIN D) 125 MCG (5000 UT) CAPS Take 5,000 Units by mouth daily.   levothyroxine (SYNTHROID) 200 MCG tablet Take 1 tablet (200 mcg total) by mouth as directed.   magnesium oxide (MAG-OX) 400 MG tablet Take 400 mg by mouth daily.   No current facility-administered medications on file prior to visit.   Surgical History:  Past Surgical History:  Procedure  Laterality Date   CHOLECYSTECTOMY     FOOT SURGERY     HAMMER TOE SURGERY Bilateral    All toes on both feet   KNEE SURGERY Left    TKA x2    1974, 2014   SHOULDER SURGERY Bilateral    Left Hemiarthroplasty,  total arthroplasty on Right shoulder   THYROIDECTOMY N/A 07/05/2021   Procedure: TOTAL THYROIDECTOMY;  Surgeon: Sheliah Hatch De Blanch, MD;  Location: [facility];  Service: General;  Laterality: N/A;   TOTAL HIP ARTHROPLASTY Left    TOTAL KNEE ARTHROPLASTY Right 11/28/2020   Procedure: TOTAL KNEE ARTHROPLASTY;  Surgeon: Durene Romans, MD;  Location: [facility];  Service: Orthopedics;  Laterality: Right;   TOTAL SHOULDER ARTHROPLASTY Left 02/08/2022   Procedure: SHOULDER REVISION TO TOTAL SHOULDER ARTHROPLASTY;  Surgeon: Beverely Low, MD;  Location: [facility];  Service: Orthopedics;  Laterality: Left;  120 MIN CHOICE WITH INTERSCALENE BLOCK   WISDOM TOOTH EXTRACTION     XI ROBOTIC ASSISTED HIATAL HERNIA REPAIR N/A 08/20/2021   Procedure: XI ROBOTIC ASSISTED HIATAL HERNIA REPAIR WITH PARTIAL FUNDOPLICATION AND EGD;  Surgeon: Sheliah Hatch De Blanch, MD;  Location: [facility];  Service: General;  Laterality: N/A;   Allergies:  Allergies  Allergen Reactions   Mango Flavor [Flavoring Agent] Anaphylaxis    Specifically mango peel , patient can eat the fruit itself   Oxycontin [Oxycodone]     Dizziness   Family History:  Family History  Problem Relation Age of Onset   Heart disease Mother    Breast cancer Maternal Grandmother    Emphysema Paternal Grandmother        never smoked but chewed tobacco       Objective:    BP 120/78   Pulse 72   Wt 211 lb 3.2 oz (95.8 kg)   LMP 08/08/2010   BMI 35.69 kg/m   Wt Readings from Last 3 Encounters:  02/13/23 211 lb 3.2 oz (95.8 kg)  01/17/23 207 lb (93.9 kg)  07/17/22 195 lb 9.6 oz (88.7 kg)    Physical Exam Vitals and nursing note reviewed.  Constitutional:      General: She is not in acute distress.    Appearance: Normal appearance.  HENT:      Head: Normocephalic and atraumatic.     Right Ear: Hearing, tympanic membrane, ear canal and external ear normal.     Left Ear: Hearing, tympanic membrane, ear canal and external ear normal.     Nose: Nose normal.     Right Sinus: No maxillary sinus tenderness or frontal sinus tenderness.     Left Sinus: No maxillary sinus tenderness or frontal sinus tenderness.     Mouth/Throat:     Lips: Pink.     Mouth: Mucous membranes are moist.     Pharynx: Oropharynx is clear.  Eyes:     General: Lids are normal. Vision grossly intact.     Extraocular Movements: Extraocular movements intact.     Conjunctiva/sclera: Conjunctivae normal.     Pupils: Pupils are equal, round, and reactive to light.     Funduscopic exam:    Right eye: Red reflex present.        Left eye: Red reflex present.    Visual Fields: Right eye visual fields normal and left eye visual fields normal.  Neck:     Thyroid: No thyromegaly.     Vascular: No carotid bruit.  Cardiovascular:     Rate and Rhythm: Normal rate and regular rhythm.     Chest Wall: PMI is not displaced.     Pulses: Normal pulses.          Dorsalis pedis pulses are 2+ on the right side and 2+ on the left side.       Posterior tibial pulses are 2+ on the right side and 2+ on the left side.     Heart sounds: Normal heart sounds. No murmur heard. Pulmonary:     Effort: Pulmonary effort is normal. No respiratory distress.     Breath sounds: Normal breath sounds.  Abdominal:     General: Abdomen is flat. Bowel sounds  are normal. There is no distension.     Palpations: Abdomen is soft. There is no hepatomegaly, splenomegaly or mass.     Tenderness: There is no abdominal tenderness. There is no right CVA tenderness, left CVA tenderness, guarding or rebound.  Musculoskeletal:        General: Normal range of motion.     Cervical back: Full passive range of motion without pain, normal range of motion and neck supple. No tenderness.     Right lower leg: No  edema.     Left lower leg: No edema.  Feet:     Left foot:     Toenail Condition: Left toenails are normal.  Lymphadenopathy:     Cervical: No cervical adenopathy.     Upper Body:     Right upper body: No supraclavicular adenopathy.     Left upper body: No supraclavicular adenopathy.  Skin:    General: Skin is warm and dry.     Capillary Refill: Capillary refill takes less than 2 seconds.     Nails: There is no clubbing.  Neurological:     General: No focal deficit present.     Mental Status: She is alert and oriented to person, place, and time.     GCS: GCS eye subscore is 4. GCS verbal subscore is 5. GCS motor subscore is 6.     Sensory: Sensation is intact.     Motor: Motor function is intact.     Coordination: Coordination is intact.     Gait: Gait is intact.     Deep Tendon Reflexes: Reflexes are normal and symmetric.  Psychiatric:        Attention and Perception: Attention normal.        Mood and Affect: Mood normal.        Speech: Speech normal.        Behavior: Behavior normal. Behavior is cooperative.        Thought Content: Thought content normal.        Cognition and Memory: Cognition and memory normal.        Judgment: Judgment normal.     Results for orders placed or performed in visit on 01/17/23  PTH, intact and calcium   Collection Time: 01/17/23  8:11 AM  Result Value Ref Range   PTH 50 16 - 77 pg/mL   Calcium 9.3 8.6 - 10.4 mg/dL  VITAMIN D 25 Hydroxy (Vit-D Deficiency, Fractures)   Collection Time: 01/17/23  8:11 AM  Result Value Ref Range   VITD 45.99 30.00 - 100.00 ng/mL  Magnesium   Collection Time: 01/17/23  8:11 AM  Result Value Ref Range   Magnesium 2.2 1.5 - 2.5 mg/dL  Phosphorus   Collection Time: 01/17/23  8:11 AM  Result Value Ref Range   Phosphorus 3.9 2.3 - 4.6 mg/dL  Thyroglobulin Level   Collection Time: 01/17/23  8:11 AM  Result Value Ref Range   Thyroglobulin 0.1 (L) ng/mL   Comment    Thyroglobulin antibody   Collection  Time: 01/17/23  8:11 AM  Result Value Ref Range   Thyroglobulin Ab <1 < or = 1 IU/mL  TSH   Collection Time: 01/17/23  8:11 AM  Result Value Ref Range   TSH 0.94 0.35 - 5.50 uIU/mL       Assessment & Plan:   Problem List Items Addressed This Visit     Other mixed anxiety disorders   Relevant Medications   sertraline (ZOLOFT) 100 MG tablet  busPIRone (BUSPAR) 7.5 MG tablet   ALPRAZolam (XANAX) 0.25 MG tablet   Asthma due to environmental allergies   Relevant Medications   cetirizine (ZYRTEC) 10 MG tablet   busPIRone (BUSPAR) 7.5 MG tablet   albuterol (VENTOLIN HFA) 108 (90 Base) MCG/ACT inhaler   Budeson-Glycopyrrol-Formoterol (BREZTRI AEROSPHERE) 160-9-4.8 MCG/ACT AERO   Primary hypertension   Relevant Medications   hydrochlorothiazide (HYDRODIURIL) 25 MG tablet   Other Relevant Orders   CBC with Differential/Platelet   CMP14+EGFR   Lipid panel   Annual physical exam - Primary   Relevant Orders   CBC with Differential/Platelet   CMP14+EGFR   Lipid panel   Other Visit Diagnoses       Moderate persistent asthma without complication       Relevant Medications   cetirizine (ZYRTEC) 10 MG tablet   busPIRone (BUSPAR) 7.5 MG tablet   albuterol (VENTOLIN HFA) 108 (90 Base) MCG/ACT inhaler   Budeson-Glycopyrrol-Formoterol (BREZTRI AEROSPHERE) 160-9-4.8 MCG/ACT AERO      Deconditioning Reports decreased stamina and difficulty walking a mile and a half, attributed to deconditioning following four surgeries last year. Started a walking program and hired a Systems analyst for a 12-week program. Participates in a group walking program that provides social support and accountability. - Encourage continuation of walking program and personal training - Monitor progress and reassess if symptoms persist  Asthma Asthma managed by GP. Reports no improvement in breathing with inhaler use. Carries an inhaler during workouts. - Refill albuterol inhaler - Refill Breztri for  maintenance  Depression Expresses feelings of depression related to societal issues and instability. Finds solace in a walking group that avoids discussing negative topics. - Continue sertraline - Encourage participation in positive social activities  Anxiety Uses alprazolam for anxiety. Unsure if refills are left. - Refill alprazolam  Allergic Rhinitis Uses cetirizine for allergic rhinitis. - Refill cetirizine  Thyroid Cancer Thyroid cancer discovered during evaluation for breathing difficulties. No current issues reported. - Continue routine follow-up as needed  General Health Maintenance Mammogram scheduled for next month. DEXA scan in 2021 showed osteopenia with less than 7% risk of major orthopedic fracture in the next ten years. Declines further COVID vaccinations. No flu shot this year. Discussed benefits of flu shot and agreed to receive it. - Administer flu shot - Order CBC, metabolic panel, and cholesterol check - Provide lab orders for [facility] - Encourage continuation of exercise for bone health  Follow-up - Follow up as needed.   Follow up plan: Return in about 1 year (around 02/13/2024) for AWV + CPE.  NEXT PREVENTATIVE PHYSICAL DUE IN 1 YEAR.  PATIENT COUNSELING PROVIDED FOR ALL ADULT PATIENTS: A well balanced diet low in saturated fats, cholesterol, and moderation in carbohydrates.  This can be as simple as monitoring portion sizes and cutting back on sugary beverages such as soda and juice to start with.    Daily water consumption of at least 64 ounces.  Physical activity at least 180 minutes per week.  If just starting out, start 10 minutes a day and work your way up.   This can be as simple as taking the stairs instead of the elevator and walking 2-3 laps around the office  purposefully every day.   STD protection, partner selection, and regular testing if high risk.  Limited consumption of alcoholic beverages if alcohol is consumed. For men, I  recommend no more than 14 alcoholic beverages per week, spread out throughout the week (max 2 per day). Avoid "binge" drinking or  consuming large quantities of alcohol in one setting.  Please let me know if you feel you may need help with reduction or quitting alcohol consumption.   Avoidance of nicotine, if used. Please let me know if you feel you may need help with reduction or quitting nicotine use.   Daily mental health attention. This can be in the form of 5 minute daily meditation, prayer, journaling, yoga, reflection, etc.  Purposeful attention to your emotions and mental state can significantly improve your overall wellbeing  and  Health.  Please know that I am here to help you with all of your health care goals and am happy to work with you to find a solution that works best for you.  The greatest advice I have received with any changes in life are to take it one step at a time, that even means if all you can focus on is the next 60 seconds, then do that and celebrate your victories.  With any changes in life, you will have set backs, and that is OK. The important thing to remember is, if you have a set back, it is not a failure, it is an opportunity to try again! Screening Testing Mammogram Every 1 -2 years based on history and risk factors Starting at age 35 Pap Smear Ages 21-39 every 3 years Ages 74-65 every 5 years with HPV testing More frequent testing may be required based on results and history Colon Cancer Screening Every 1-10 years based on test performed, risk factors, and history Starting at age 37 Bone Density Screening Every 2-10 years based on history Starting at age 60 for women Recommendations for men differ based on medication usage, history, and risk factors AAA Screening One time ultrasound Men 51-35 years old who have every smoked Lung Cancer Screening Low Dose Lung CT every 12 months Age 24-80 years with a 30 pack-year smoking history who still smoke or  who have quit within the last 15 years   Screening Labs Routine  Labs: Complete Blood Count (CBC), Complete Metabolic Panel (CMP), Cholesterol (Lipid Panel) Every 6-12 months based on history and medications May be recommended more frequently based on current conditions or previous results Hemoglobin A1c Lab Every 3-12 months based on history and previous results Starting at age 85 or earlier with diagnosis of diabetes, high cholesterol, BMI >26, and/or risk factors Frequent monitoring for patients with diabetes to ensure blood sugar control Thyroid Panel (TSH) Every 6 months based on history, symptoms, and risk factors May be repeated more often if on medication HIV One time testing for all patients 101 and older May be repeated more frequently for patients with increased risk factors or exposure Hepatitis C One time testing for all patients 85 and older May be repeated more frequently for patients with increased risk factors or exposure Gonorrhea, Chlamydia Every 12 months for all sexually active persons 13-24 years Additional monitoring may be recommended for those who are considered high risk or who have symptoms Every 12 months for any woman on birth control, regardless of sexual activity PSA Men 7-30 years old with risk factors Additional screening may be recommended from age 75-69 based on risk factors, symptoms, and history  Vaccine Recommendations Tetanus Booster All adults every 10 years Flu Vaccine All patients 6 months and older every year COVID Vaccine All patients 12 years and older Initial dosing with booster May recommend additional booster based on age and health history HPV Vaccine 2 doses all patients age 59-26 Dosing may  be considered for patients over 26 Shingles Vaccine (Shingrix) 2 doses all adults 55 years and older Pneumonia (Pneumovax 23) All adults 65 years and older May recommend earlier dosing based on health history One year apart from Prevnar  13 Pneumonia (Prevnar 70) All adults 65 years and older Dosed 1 year after Pneumovax 23 Pneumonia (Prevnar 20) One time alternative to the two dosing of 13 and 23 For all adults with initial dose of 23, 20 is recommended 1 year later For all adults with initial dose of 13, 23 is still recommended as second option 1 year later

## 2023-02-14 ENCOUNTER — Encounter: Payer: Self-pay | Admitting: Nurse Practitioner

## 2023-02-14 LAB — CBC WITH DIFFERENTIAL/PLATELET
Basophils Absolute: 0.1 10*3/uL (ref 0.0–0.2)
Basos: 1 %
EOS (ABSOLUTE): 0.3 10*3/uL (ref 0.0–0.4)
Eos: 4 %
Hematocrit: 40.7 % (ref 34.0–46.6)
Hemoglobin: 12.9 g/dL (ref 11.1–15.9)
Immature Grans (Abs): 0 10*3/uL (ref 0.0–0.1)
Immature Granulocytes: 1 %
Lymphocytes Absolute: 1.4 10*3/uL (ref 0.7–3.1)
Lymphs: 16 %
MCH: 28.3 pg (ref 26.6–33.0)
MCHC: 31.7 g/dL (ref 31.5–35.7)
MCV: 89 fL (ref 79–97)
Monocytes Absolute: 0.8 10*3/uL (ref 0.1–0.9)
Monocytes: 9 %
Neutrophils Absolute: 6.2 10*3/uL (ref 1.4–7.0)
Neutrophils: 69 %
Platelets: 247 10*3/uL (ref 150–450)
RBC: 4.56 x10E6/uL (ref 3.77–5.28)
RDW: 13.8 % (ref 11.7–15.4)
WBC: 8.8 10*3/uL (ref 3.4–10.8)

## 2023-02-14 LAB — LIPID PANEL
Chol/HDL Ratio: 2.5 {ratio} (ref 0.0–4.4)
Cholesterol, Total: 233 mg/dL — ABNORMAL HIGH (ref 100–199)
HDL: 93 mg/dL (ref >39)
LDL Chol Calc (NIH): 115 mg/dL — ABNORMAL HIGH (ref 0–99)
Triglycerides: 147 mg/dL (ref 0–149)
VLDL Cholesterol Cal: 25 mg/dL (ref 5–40)

## 2023-02-14 LAB — CMP14+EGFR
ALT: 24 [IU]/L (ref 0–32)
AST: 33 [IU]/L (ref 0–40)
Albumin: 4.5 g/dL (ref 3.9–4.9)
Alkaline Phosphatase: 94 [IU]/L (ref 44–121)
BUN/Creatinine Ratio: 19 (ref 12–28)
BUN: 16 mg/dL (ref 8–27)
Bilirubin Total: 0.3 mg/dL (ref 0.0–1.2)
CO2: 25 mmol/L (ref 20–29)
Calcium: 9.1 mg/dL (ref 8.7–10.3)
Chloride: 99 mmol/L (ref 96–106)
Creatinine, Ser: 0.84 mg/dL (ref 0.57–1.00)
Globulin, Total: 2.3 g/dL (ref 1.5–4.5)
Glucose: 84 mg/dL (ref 70–99)
Potassium: 4.2 mmol/L (ref 3.5–5.2)
Sodium: 140 mmol/L (ref 134–144)
Total Protein: 6.8 g/dL (ref 6.0–8.5)
eGFR: 76 mL/min/{1.73_m2} (ref >59)

## 2023-02-20 ENCOUNTER — Encounter: Payer: Self-pay | Admitting: Nurse Practitioner

## 2023-02-24 ENCOUNTER — Other Ambulatory Visit (HOSPITAL_BASED_OUTPATIENT_CLINIC_OR_DEPARTMENT_OTHER): Payer: Self-pay

## 2023-04-07 ENCOUNTER — Other Ambulatory Visit: Payer: Self-pay | Admitting: Nurse Practitioner

## 2023-04-07 DIAGNOSIS — Z0000 Encounter for general adult medical examination without abnormal findings: Secondary | ICD-10-CM

## 2023-04-11 ENCOUNTER — Ambulatory Visit
Admission: RE | Admit: 2023-04-11 | Discharge: 2023-04-11 | Disposition: A | Discharge status: Home / Self Care | Payer: Medicare PPO | Source: Ambulatory Visit | Attending: Nurse Practitioner | Admitting: Nurse Practitioner

## 2023-04-11 DIAGNOSIS — Z0000 Encounter for general adult medical examination without abnormal findings: Secondary | ICD-10-CM

## 2023-04-11 DIAGNOSIS — Z1231 Encounter for screening mammogram for malignant neoplasm of breast: Secondary | ICD-10-CM | POA: Diagnosis not present

## 2023-04-24 ENCOUNTER — Encounter: Payer: Self-pay | Admitting: Nurse Practitioner

## 2023-07-22 ENCOUNTER — Ambulatory Visit: Payer: Medicare PPO | Admitting: Internal Medicine

## 2023-07-22 ENCOUNTER — Encounter: Payer: Self-pay | Admitting: Internal Medicine

## 2023-07-22 VITALS — BP 124/82 | HR 62 | Ht 64.5 in | Wt 202.6 lb

## 2023-07-22 DIAGNOSIS — Z85850 Personal history of malignant neoplasm of thyroid: Secondary | ICD-10-CM | POA: Diagnosis not present

## 2023-07-22 DIAGNOSIS — E890 Postprocedural hypothyroidism: Secondary | ICD-10-CM | POA: Diagnosis not present

## 2023-07-22 NOTE — Progress Notes (Signed)
 Name: [name]  MRN/ DOB: [id number], 06-06-1955    Age/ Sex: 68 y.o., female    PCP: Early, Adriane Albe, NP   Reason for Endocrinology Evaluation: MNG     Date of Initial Endocrinology Evaluation: 07/22/2023     HPI: [name] is a 68 y.o. female with a past medical history of Asthma, MNG. The patient presented for initial endocrinology clinic visit on 07/22/2023 for consultative assistance with her MNG.    During evaluation for chronic anemia she was seen by GI and was noted to have a hiatal hernia.  A CT scan was ordered for further evaluation of hiatal hernia and the patient had an incidental 2.2 cm left thyroid  nodule, which prompted a thyroid  ultrasound showing multinodular goiter.  Per patient she has been diagnosed with multinodular goiter in 2002 She was on LT-for replacement for many years before this was stopped by a physician    She is S/P FNA of the right mid 4.5 cm nodule with a cytology report consistent with suspicious for malignancy ( Bethesda Category V) . 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  09/2021: Tg 0.1 (TgAb <1) 01/2022: Tg 0.2 (TgAB) < 1) 01/2022: US  right resection bed hypoechoic 5 x 6 x 5 mm, left resection bed 12 x 5 x 5 mm 07/17/2022: Tg 0.1 (Ab negative) 07/24/2022: US : Right hypoechoic nodule decreased in size from 0.6 cm to 0.4 cm, left resection bed nodule no longer visualized 01/17/2023: Tg 0.1 (Ab neg) 01/17/2023: US : Stable 4 mm right thyroid  bed nodule  Switched to Levothyroxine  at night, as that has worked better for her    SUBJECTIVE:   Today (07/22/23):  [name] is here for a follow up on PTC.    She is S/P total thyroidectomy 07/05/2021  Pt has been noted with weight loss  Denies local neck swelling  Denies constipation or  diarrhea  Denies palpitations  Denies tremors  No biotin    Levothyroxine  200 mg ,  1 tablet Monday through Saturday , skips Sundays  Vitamin D  2000 international unit  Vitamin B Complex    HISTORY:  Past Medical History:  Past Medical History:  Diagnosis Date   Anemia    Anxiety    Arthritis    Asthma    Cancer (HCC) 2023   Thyroid    Depression    Dyspnea    GERD (gastroesophageal reflux disease)    Goiter    History of hiatal hernia    Hypertension    Hyperthyroidism    Pain in joint of left shoulder 12/11/2021   Past Surgical History:  Past Surgical History:  Procedure Laterality Date   CHOLECYSTECTOMY     FOOT SURGERY     HAMMER TOE SURGERY Bilateral    All toes on both feet   KNEE SURGERY Left    TKA x2    1974, 2014   SHOULDER SURGERY Bilateral    Left Hemiarthroplasty,  total arthroplasty on Right shoulder   THYROIDECTOMY N/A 07/05/2021   Procedure: TOTAL THYROIDECTOMY;  Surgeon: Derral Flick, MD;  Location: [facility];  Service: General;  Laterality: N/A;   TOTAL HIP ARTHROPLASTY Left    TOTAL KNEE ARTHROPLASTY Right 11/28/2020   Procedure: TOTAL KNEE ARTHROPLASTY;  Surgeon: Claiborne Crew, MD;  Location: [facility];  Service: Orthopedics;  Laterality: Right;   TOTAL SHOULDER ARTHROPLASTY Left 02/08/2022   Procedure: SHOULDER REVISION TO TOTAL SHOULDER ARTHROPLASTY;  Surgeon: Winston Hawking, MD;  Location: [facility];  Service: Orthopedics;  Laterality: Left;  120 MIN CHOICE WITH INTERSCALENE BLOCK   WISDOM TOOTH EXTRACTION     XI ROBOTIC ASSISTED HIATAL HERNIA REPAIR N/A 08/20/2021   Procedure: XI ROBOTIC ASSISTED HIATAL HERNIA REPAIR WITH PARTIAL FUNDOPLICATION AND EGD;  Surgeon: [name], Alphonso Aschoff, MD;  Location: [facility];  Service: General;  Laterality: N/A;    Social History:  reports that she quit smoking about 51 years ago. Her smoking use included cigarettes. She started smoking about 55 years ago. She has a 4 pack-year smoking history. She has never  used smokeless tobacco. She reports current alcohol use. She reports that she does not use drugs. Family History: family history includes Breast cancer in her maternal grandmother; Emphysema in her paternal grandmother; Heart disease in her mother.   HOME MEDICATIONS: Allergies as of 07/22/2023       Reactions   Mango Flavor [flavoring Agent (non-screening)] Anaphylaxis   Specifically mango peel , patient can eat the fruit itself   Oxycontin [oxycodone]    Dizziness        Medication List        Accurate as of Jul 22, 2023  8:02 AM. If you have any questions, ask your nurse or doctor.          albuterol  108 (90 Base) MCG/ACT inhaler Commonly known as: VENTOLIN  HFA Inhale 2 puffs into the lungs every 6 (six) hours as needed for wheezing or shortness of breath.   ALPRAZolam  0.25 MG tablet Commonly known as: XANAX  TAKE 1/2 TO 1 TABLET BY MOUTH UP TO TWO TIMES A DAY AS NEEDED FOR PANIC /ANXIETY   b complex vitamins capsule Take 1 capsule by mouth daily.   Breztri  Aerosphere 160-9-4.8 MCG/ACT Aero inhaler Generic drug: budesonide -glycopyrrolate -formoterol  Two puffs twice a day for maintenance.   busPIRone  7.5 MG tablet Commonly known as: BUSPAR  Take 1 tablet (7.5 mg total) by mouth 2 (two) times daily.   cetirizine  10 MG tablet Commonly known as: ZYRTEC  Take 1 tablet (10 mg total) by mouth 2 (two) times daily.   fluticasone  50 MCG/ACT nasal spray Commonly known as: FLONASE  Place 1 spray into both nostrils daily.   hydrochlorothiazide  25 MG tablet Commonly known as: HYDRODIURIL  Take 1 tablet (25 mg total) by mouth every morning.   levothyroxine  200 MCG tablet Commonly known as: SYNTHROID  Take 1 tablet (200 mcg total) by mouth as directed. What changed: additional instructions   magnesium  oxide 400 MG tablet Commonly known as: MAG-OX Take 400 mg by mouth daily.   sertraline  100 MG tablet Commonly known as: ZOLOFT  TAKE 1.5 TABLETS BY MOUTH EVERY MORNING    SOLARCAINE EX Apply 1 application. topically daily as needed (bug bites).   vitamin C  1000 MG tablet Take 1,000 mg by mouth daily.   Vitamin D  125 MCG (5000 UT) Caps Take 5,000 Units by mouth daily.          REVIEW OF SYSTEMS: A comprehensive ROS was conducted with the patient and is negative except as per HPI    OBJECTIVE:  VS:BP 124/82 (BP Location: Left Arm, Patient Position: Sitting, Cuff Size: Normal)   Pulse 62   Ht 5' 4.5" (1.638 m)   Wt 202 lb 9.6 oz (91.9 kg)   LMP 08/08/2010  SpO2 97%   BMI 34.24 kg/m    Wt Readings from Last 3 Encounters:  07/22/23 202 lb 9.6 oz (91.9 kg)  02/13/23 211 lb 3.2 oz (95.8 kg)  01/17/23 207 lb (93.9 kg)     EXAM: General: Pt appears well and is in NAD  Neck: General: Supple without adenopathy. Thyroid : no nodules appreciated   Lungs: Clear with good BS bilat   Heart: Auscultation: RRR.  Abdomen: soft, nontender  Extremities:  BL LE: No pretibial edema   Mental Status: Judgment, insight: Intact Orientation: Oriented to time, place, and person Mood and affect: No depression, anxiety, or agitation     DATA REVIEWED:   Latest Reference Range & Units 07/22/23 08:30  TSH 0.40 - 4.50 mIU/L 0.06 (L)      Latest Reference Range & Units 01/17/23 08:11  Thyroglobulin ng/mL 0.1 (L)  Thyroglobulin Ab < or = 1 IU/mL <1      SURGICAL PATHOLOGY 07/05/2021  A. THYROID , TOTAL THYROIDECTOMY:  Papillary carcinoma, oncocytic variant, of right lobe (see comment)  Tumor measures 3.8 x 3.1 x 2.1 cm  Margins free  Background of multinodular goiter with dominant nodule within the left  lobe   Comment:  Sections of the right thyroid  show a lobulated circumscribed thinly  encapsulated nodule composed of variably sized follicular groups showing  follicular cells with abundant granular cytoplasm all within a fibrotic  focally hyalinized fibrovascular stroma.  These cells show a variable  but moderate nuclear enlargement and  cytologic atypia.  Many of the  cells show enlarged nuclei with clearing and margination of the nuclear  chromatin and scattered small inconspicuous nucleoli.  Although no  overlap is seen, the cells show frequent nuclear grooves, and nuclear  pseudoinclusions are readily identified.  There is scant associated  colloid.  Scattered calcifications and psammoma bodies are seen.  These  findings are consistent with a papillary carcinoma with oncocytic  features.   ONCOLOGY TABLE   THYROID  GLAND, CARCINOMA: Resection   Procedure: Total thyroidectomy  Tumor Focality: Unifocal  Tumor Site: Right lobe  Tumor Size: 3.8 x 3.1 x 2.1 cm  Histologic Type: Papillary carcinoma, oncocytic variant  Angioinvasion: Not identified  Lymphatic Invasion: Not identified  Extrathyroidal Extension: Not identified  Margin Status: All margins negative for invasive carcinoma  Regional Lymph Node Status: Not applicable (no regional lymph nodes  submitted or found)  Distant Metastasis: Not applicable  Pathologic Stage Classification (pTNM, AJCC 8th Edition): pT2, pN n/a    Thyroid  bed ultrasound 01/17/2023 FINDINGS: Surgical changes of total thyroidectomy. In the right thyroid  resection bed there is again a small round nodular focus measuring 0.4 x 0.4 x 0.3 cm. This is completely unchanged compared to prior.   No new nodules, lymphadenopathy or other suspicious feature.   IMPRESSION: Stable appearance of small 4 mm nodule in the right thyroid  resection bed.   Surgical changes of total thyroidectomy. No other evidence of residual/recurrent disease.    ASSESSMENT/PLAN/RECOMMENDATIONS:   Papillary thyroid  carcinoma:   -Patient is S/P  total thyroidectomy on Jul 05, 2021, oncocytic variant, unifocal, 3.8 cm at max diameter, angioinvasion not identified, NO lymph nodes where submitted -Given oncocytic variant and in review of the literature there has been significant controversy about the biological  behavior and optimal management of oncocytic- PTC variant but overall they have similar prognosis to non- oncocytic PTC.  - RAI ablation use in the setting of oncocytic PTC is variable due to the suggestion that there is limited  uptake of RAI and therefore limited benefit -TSH goal 0.5-2 u IU/mL -We will proceed with TG, TG AB, historically these have been undetectable -Thyroid  bed ultrasound 07/2022 showed decrease in size of the right thyroid  bed nodule, with disappearance of the left thyroid  bed nodule -Will repeat thyroid  bed ultrasound  2.  Postoperative hypothyroidism  -Patient is clinically euthyroid -TSH is below goal, will change levothyroxine  as below and recheck in 2 months Medication  Stop levothyroxine  200 mcg, 1 tablet Monday through Saturday and none on Sundays Start levothyroxine  150 mcg daily   F/U in 6 months    Signed electronically by: Natale Bail, MD  [facility] End[facility]in[facility]  [facility] [address] [place] [place], [place] [address] Phone: [phone] FAX: [phone]   CC: Annella Kief, NP [address] [place] [address] Phone: [phone] Fax: [phone]   Return to Endocrinology clinic as below: Future Appointments  Date Time Provider Department Center  02/03/2024  8:10 AM [facility]-ANNUAL WELLNESS VISIT [facility]     And vitamin D  are normal

## 2023-07-23 ENCOUNTER — Telehealth: Payer: Self-pay | Admitting: Internal Medicine

## 2023-07-23 ENCOUNTER — Ambulatory Visit: Payer: Self-pay | Admitting: Internal Medicine

## 2023-07-23 LAB — THYROGLOBULIN LEVEL: Thyroglobulin: 0.1 ng/mL — ABNORMAL LOW

## 2023-07-23 LAB — TSH: TSH: 0.06 m[IU]/L — ABNORMAL LOW (ref 0.40–4.50)

## 2023-07-23 LAB — THYROGLOBULIN ANTIBODY: Thyroglobulin Ab: <1 [IU]/mL

## 2023-07-23 MED ORDER — LEVOTHYROXINE SODIUM 150 MCG PO TABS: 150.0000 ug | ORAL_TABLET | Freq: Every day | ORAL | 3 refills | Status: DC

## 2023-07-23 NOTE — Telephone Encounter (Signed)
 Please contact the patient and schedule her for a lab appointment in 2 months, a portal message was sent to her   Thanks

## 2023-07-24 ENCOUNTER — Ambulatory Visit
Admission: RE | Admit: 2023-07-24 | Discharge: 2023-07-24 | Disposition: A | Discharge status: Home / Self Care | Source: Ambulatory Visit | Attending: Internal Medicine | Admitting: Internal Medicine

## 2023-07-24 DIAGNOSIS — E041 Nontoxic single thyroid nodule: Secondary | ICD-10-CM | POA: Diagnosis not present

## 2023-07-24 DIAGNOSIS — Z85850 Personal history of malignant neoplasm of thyroid: Secondary | ICD-10-CM

## 2023-08-08 ENCOUNTER — Other Ambulatory Visit: Payer: Self-pay | Admitting: Nurse Practitioner

## 2023-08-08 DIAGNOSIS — F413 Other mixed anxiety disorders: Secondary | ICD-10-CM

## 2023-08-20 ENCOUNTER — Encounter: Payer: Self-pay | Admitting: Internal Medicine

## 2023-08-25 DIAGNOSIS — M1611 Unilateral primary osteoarthritis, right hip: Secondary | ICD-10-CM | POA: Diagnosis not present

## 2023-09-08 ENCOUNTER — Encounter: Payer: Self-pay | Admitting: Nurse Practitioner

## 2023-09-08 ENCOUNTER — Ambulatory Visit: Admitting: Nurse Practitioner

## 2023-09-08 VITALS — BP 122/78 | HR 54 | Wt 205.6 lb

## 2023-09-08 DIAGNOSIS — Z01818 Encounter for other preprocedural examination: Secondary | ICD-10-CM | POA: Diagnosis not present

## 2023-09-08 NOTE — Assessment & Plan Note (Signed)
 Exam normal today with no concerning findings present, aside from right hip pain and gait alteration associated with this. BP well controlled. Lungs clear. No concerns with recent asthma exacerbations. Review of records shows no significant cardiac concerns. Recommendations for aggressive pulmonary toileting and Deshannon Seide ambulation. No additional labs needed as these will be completed with surgery. Patient is considered low risk at this time. Information completed on form and a copy provided to the patient. Form to be faxed to Dr. Ernie today.

## 2023-09-08 NOTE — Progress Notes (Signed)
 Christine Doing, DNP, AGNP-c [facility]  [address] [place], [place] [address] [phone]  ESTABLISHED PATIENT- Chronic Health and/or Follow-Up Visit  Blood pressure 122/78, pulse (!) 54, weight 205 lb 9.6 oz (93.3 kg), last menstrual period 08/08/2010, SpO2 97%.    [name] is a 68 y.o. year old female presenting today for evaluation for surgical clearance for right hip replacement.   She is experiencing significant pain in her right hip, which is scheduled for replacement surgery 11/04/2023 with Dr. Ernie. This is the last joint to be replaced, as she has previously had both knees, both shoulders, and the left hip replaced. She is on a waitlist for an earlier date.  Her asthma is well-controlled with Breztri , which she takes twice a day, and albuterol  as needed. There have been no recent issues with asthma, including after her last surgery.   She had a cardiac work-up in 2023 with no concerning findings. She has no symptoms of shortness of breath, palpitations, dizziness, or LE edema (aside from right leg swelling r/t hip).   She is followed with endocrinology for her history of thyroid  removal. She has labs scheduled on July 23.   She has been on prednisone  for pain management, but it has not been effective. She also received a cortisone shot, which provided no relief. The pain has escalated rapidly over the past three weeks, going from manageable to severe.  She is retired and mentions that she used to participate in dog walking groups and personal training before the pain became debilitating. She is eager to feel better and return to her normal activities.     All ROS negative with exception of what is listed above.   PHYSICAL EXAM Physical Exam Vitals and nursing note reviewed.  Constitutional:      General: She is not in acute distress.    Appearance: Normal appearance. She is obese. She is not ill-appearing.  HENT:     Head: Normocephalic.   Eyes:     Conjunctiva/sclera: Conjunctivae normal.  Neck:     Vascular: No carotid bruit.  Cardiovascular:     Rate and Rhythm: Normal rate and regular rhythm.     Pulses: Normal pulses.     Heart sounds: Normal heart sounds.  Pulmonary:     Effort: Pulmonary effort is normal. No respiratory distress.     Breath sounds: Normal breath sounds. No wheezing or rhonchi.  Abdominal:     Palpations: Abdomen is soft.  Musculoskeletal:     Right lower leg: No edema.     Left lower leg: No edema.  Skin:    General: Skin is warm and dry.     Capillary Refill: Capillary refill takes less than 2 seconds.  Neurological:     General: No focal deficit present.     Mental Status: She is alert and oriented to person, place, and time.  Psychiatric:        Attention and Perception: Attention normal.        Mood and Affect: Affect normal.        Speech: Speech normal.        Behavior: Behavior is cooperative.        Thought Content: Thought content normal.     PLAN Problem List Items Addressed This Visit     Pre-op evaluation - Primary   Exam normal today with no concerning findings present, aside from right hip pain and gait alteration associated with this. BP well controlled. Lungs clear.  No concerns with recent asthma exacerbations. Review of records shows no significant cardiac concerns. Recommendations for aggressive pulmonary toileting and Abdiaziz Klahn ambulation. No additional labs needed as these will be completed with surgery. Patient is considered low risk at this time. Information completed on form and a copy provided to the patient. Form to be faxed to Dr. Ernie today.        Return if symptoms worsen or fail to improve.  Christine Doing, DNP, AGNP-c

## 2023-09-10 NOTE — Patient Instructions (Addendum)
 SURGICAL WAITING ROOM VISITATION  Patients having surgery or a procedure may have no more than 2 support people in the waiting area - these visitors may rotate.    Children under the age of 17 must have an adult with them who is not the patient.  Visitors with respiratory illnesses are discouraged from visiting and should remain at home.  If the patient needs to stay at the hospital during part of their recovery, the visitor guidelines for inpatient rooms apply. Pre-op nurse will coordinate an appropriate time for 1 support person to accompany patient in pre-op.  This support person may not rotate.    Please refer to the [facility] website for the visitor guidelines for Inpatients (after your surgery is over and you are in a regular room).       Your procedure is scheduled on: Thursday, September 18, 2023   Report to [facility] Main Entrance    Report to admitting at 6:15 AM   Call this number if you have problems the morning of surgery [phone]   Do not eat food or drink liquids:After Midnight.but may have sips of water  with meds.    Oral Hygiene is also important to reduce your risk of infection.                                    Remember - BRUSH YOUR TEETH THE MORNING OF SURGERY WITH YOUR REGULAR TOOTHPASTE   Stop all vitamins and herbal supplements 7 days before surgery.   Take these medicines the morning of surgery with A SIP OF WATER : Alprazolam  if needed Buspirone  Cetirizine  Levothyroxine  Sertraline  Use Asthma Inhaler per normal routine Use Flonase  per normal routine Bring rescue asthma inhaler    DO NOT TAKE ANY ORAL DIABETIC MEDICATIONS DAY OF YOUR SURGERY             You may not have any metal on your body including hair pins, jewelry, and body piercing             Do not wear make-up, lotions, powders, perfumes/cologne, or deodorant  Do not wear nail polish including gel and S&S, artificial/acrylic nails, or any other type of covering on natural  nails including finger and toenails. If you have artificial nails, gel coating, etc. that needs to be removed by a nail salon please have this removed prior to surgery or surgery may need to be canceled/ delayed if the surgeon/ anesthesia feels like they are unable to be safely monitored.   Do not shave  48 hours prior to surgery.   Do not bring valuables to the hospital. [facility] IS NOT             RESPONSIBLE   FOR VALUABLES.              Do not take hydrochlorothiazide  the morning of surgery.   Contacts, glasses, dentures or bridgework may not be worn into surgery.   Bring small overnight bag day of surgery.   DO NOT BRING YOUR HOME MEDICATIONS TO THE HOSPITAL. PHARMACY WILL DISPENSE MEDICATIONS LISTED ON YOUR MEDICATION LIST TO YOU DURING YOUR ADMISSION IN THE HOSPITAL!    Patients discharged on the day of surgery will not be allowed to drive home.  Someone NEEDS to stay with you for the first 24 hours after anesthesia.   Special Instructions: Bring a copy of your healthcare power of attorney and living  will documents the day of surgery if you haven't scanned them before.              Please read over the following fact sheets you were given: IF YOU HAVE QUESTIONS ABOUT YOUR PRE-OP INSTRUCTIONS PLEASE CALL [phone] [place]   Pre-operative 5 CHG Bath Instructions   You can play a key role in reducing the risk of infection after surgery. Your skin needs to be as free of germs as possible. You can reduce the number of germs on your skin by washing with CHG (chlorhexidine  gluconate) soap before surgery. CHG is an antiseptic soap that kills germs and continues to kill germs even after washing.   DO NOT use if you have an allergy to chlorhexidine /CHG or antibacterial soaps. If your skin becomes reddened or irritated, stop using the CHG and notify one of our RNs at [phone].   Please shower with the CHG soap starting 4 days before surgery using the following schedule:      Please keep in mind the following:  DO NOT shave, including legs and underarms, starting the day of your first shower.   You may shave your face at any point before/day of surgery.  Place clean sheets on your bed the day you start using CHG soap. Use a clean washcloth (not used since being washed) for each shower. DO NOT sleep with pets once you start using the CHG.   CHG Shower Instructions:  If you choose to wash your hair and private area, wash first with your normal shampoo/soap.  After you use shampoo/soap, rinse your hair and body thoroughly to remove shampoo/soap residue.  Turn the water  OFF and apply about 3 tablespoons (45 ml) of CHG soap to a CLEAN washcloth.  Apply CHG soap ONLY FROM YOUR NECK DOWN TO YOUR TOES (washing for 3-5 minutes)  DO NOT use CHG soap on face, private areas, open wounds, or sores.  Pay special attention to the area where your surgery is being performed.  If you are having back surgery, having someone wash your back for you may be helpful. Wait 2 minutes after CHG soap is applied, then you may rinse off the CHG soap.  Pat dry with a clean towel  Put on clean clothes/pajamas   If you choose to wear lotion, please use ONLY the CHG-compatible lotions on the back of this paper.     Additional instructions for the day of surgery: DO NOT APPLY any lotions, deodorants, cologne, or perfumes.   Put on clean/comfortable clothes.  Brush your teeth.  Ask your nurse before applying any prescription medications to the skin.      CHG Compatible Lotions   Aveeno Moisturizing lotion  Cetaphil Moisturizing Cream  Cetaphil Moisturizing Lotion  Clairol Herbal Essence Moisturizing Lotion, Dry Skin  Clairol Herbal Essence Moisturizing Lotion, Extra Dry Skin  Clairol Herbal Essence Moisturizing Lotion, Normal Skin  Curel Age Defying Therapeutic Moisturizing Lotion with Alpha Hydroxy  Curel Extreme Care Body Lotion  Curel Soothing Hands Moisturizing Hand  Lotion  Curel Therapeutic Moisturizing Cream, Fragrance-Free  Curel Therapeutic Moisturizing Lotion, Fragrance-Free  Curel Therapeutic Moisturizing Lotion, Original Formula  Eucerin Daily Replenishing Lotion  Eucerin Dry Skin Therapy Plus Alpha Hydroxy Crme  Eucerin Dry Skin Therapy Plus Alpha Hydroxy Lotion  Eucerin Original Crme  Eucerin Original Lotion  Eucerin Plus Crme Eucerin Plus Lotion  Eucerin TriLipid Replenishing Lotion  Keri Anti-Bacterial Hand Lotion  Keri Deep Conditioning Original Lotion Dry Skin Formula Softly Scented  Keri Deep  Conditioning Original Lotion, Fragrance Free Sensitive Skin Formula  Keri Lotion Fast Absorbing Fragrance Free Sensitive Skin Formula  Keri Lotion Fast Absorbing Softly Scented Dry Skin Formula  Keri Original Lotion  Keri Skin Renewal Lotion Keri Silky Smooth Lotion  Keri Silky Smooth Sensitive Skin Lotion  Nivea Body Creamy Conditioning Oil  Nivea Body Extra Enriched Teacher, adult education Moisturizing Lotion Nivea Crme  Nivea Skin Firming Lotion  NutraDerm 30 Skin Lotion  NutraDerm Skin Lotion  NutraDerm Therapeutic Skin Cream  NutraDerm Therapeutic Skin Lotion  ProShield Protective Hand Cream  Provon moisturizing lotion  ________________________________________________________________________ WHAT IS A BLOOD TRANSFUSION? Blood Transfusion Information  A transfusion is the replacement of blood or some of its parts. Blood is made up of multiple cells which provide different functions. Red blood cells carry oxygen and are used for blood loss replacement. White blood cells fight against infection. Platelets control bleeding. Plasma helps clot blood. Other blood products are available for specialized needs, such as hemophilia or other clotting disorders. BEFORE THE TRANSFUSION  Who gives blood for transfusions?  Healthy volunteers who are fully evaluated to make sure their blood is safe. This is blood  bank blood. Transfusion therapy is the safest it has ever been in the practice of medicine. Before blood is taken from a donor, a complete history is taken to make sure that person has no history of diseases nor engages in risky social behavior (examples are intravenous drug use or sexual activity with multiple partners). The donor's travel history is screened to minimize risk of transmitting infections, such as malaria. The donated blood is tested for signs of infectious diseases, such as HIV and hepatitis. The blood is then tested to be sure it is compatible with you in order to minimize the chance of a transfusion reaction. If you or a relative donates blood, this is often done in anticipation of surgery and is not appropriate for emergency situations. It takes many days to process the donated blood. RISKS AND COMPLICATIONS Although transfusion therapy is very safe and saves many lives, the main dangers of transfusion include:  Getting an infectious disease. Developing a transfusion reaction. This is an allergic reaction to something in the blood you were given. Every precaution is taken to prevent this. The decision to have a blood transfusion has been considered carefully by your caregiver before blood is given. Blood is not given unless the benefits outweigh the risks. AFTER THE TRANSFUSION Right after receiving a blood transfusion, you will usually feel much better and more energetic. This is especially true if your red blood cells have gotten low (anemic). The transfusion raises the level of the red blood cells which carry oxygen, and this usually causes an energy increase. The nurse administering the transfusion will monitor you carefully for complications. HOME CARE INSTRUCTIONS  No special instructions are needed after a transfusion. You may find your energy is better. Speak with your caregiver about any limitations on activity for underlying diseases you may have. SEEK MEDICAL CARE IF:  Your  condition is not improving after your transfusion. You develop redness or irritation at the intravenous (IV) site. SEEK IMMEDIATE MEDICAL CARE IF:  Any of the following symptoms occur over the next 12 hours: Shaking chills. You have a temperature by mouth above 102 F (38.9 C), not controlled by medicine. Chest, back, or muscle pain. People around you feel you are not acting correctly or are confused. Shortness of breath or difficulty breathing. Dizziness  and fainting. You get a rash or develop hives. You have a decrease in urine output. Your urine turns a dark color or changes to pink, red, or brown. Any of the following symptoms occur over the next 10 days: You have a temperature by mouth above 102 F (38.9 C), not controlled by medicine. Shortness of breath. Weakness after normal activity. The white part of the eye turns yellow (jaundice). You have a decrease in the amount of urine or are urinating less often. Your urine turns a dark color or changes to pink, red, or brown. Document Released: 02/16/2000 Document Revised: 05/13/2011 Document Reviewed: 10/05/2007 [facility] Patient Information 2014 [place], [place].  _______________________________________________________________________

## 2023-09-10 NOTE — Progress Notes (Signed)
 Sent message, via epic in basket, requesting orders in epic from Careers adviser.

## 2023-09-11 ENCOUNTER — Encounter (HOSPITAL_COMMUNITY): Payer: Self-pay

## 2023-09-11 ENCOUNTER — Other Ambulatory Visit: Payer: Self-pay

## 2023-09-11 ENCOUNTER — Encounter (HOSPITAL_COMMUNITY)
Admission: RE | Admit: 2023-09-11 | Discharge: 2023-09-11 | Disposition: A | Discharge status: Home / Self Care | Source: Ambulatory Visit | Attending: Orthopedic Surgery | Admitting: Orthopedic Surgery

## 2023-09-11 VITALS — BP 158/92 | HR 62 | Temp 98.1°F | Resp 18 | Ht 65.0 in | Wt 206.0 lb

## 2023-09-11 DIAGNOSIS — Z01818 Encounter for other preprocedural examination: Secondary | ICD-10-CM | POA: Diagnosis not present

## 2023-09-11 DIAGNOSIS — I10 Essential (primary) hypertension: Secondary | ICD-10-CM | POA: Diagnosis not present

## 2023-09-11 HISTORY — DX: Hypothyroidism, unspecified: E03.9

## 2023-09-11 LAB — BASIC METABOLIC PANEL WITH GFR
Anion gap: 9 (ref 5–15)
BUN: 27 mg/dL — ABNORMAL HIGH (ref 8–23)
CO2: 27 mmol/L (ref 22–32)
Calcium: 9.4 mg/dL (ref 8.9–10.3)
Chloride: 100 mmol/L (ref 98–111)
Creatinine, Ser: 0.76 mg/dL (ref 0.44–1.00)
GFR, Estimated: >60 mL/min (ref >60)
Glucose, Bld: 97 mg/dL (ref 70–99)
Potassium: 5 mmol/L (ref 3.5–5.1)
Sodium: 136 mmol/L (ref 135–145)

## 2023-09-11 LAB — CBC
HCT: 40.3 % (ref 36.0–46.0)
Hemoglobin: 13 g/dL (ref 12.0–15.0)
MCH: 29 pg (ref 26.0–34.0)
MCHC: 32.3 g/dL (ref 30.0–36.0)
MCV: 90 fL (ref 80.0–100.0)
Platelets: 245 K/uL (ref 150–400)
RBC: 4.48 MIL/uL (ref 3.87–5.11)
RDW: 14.6 % (ref 11.5–15.5)
WBC: 11.9 K/uL — ABNORMAL HIGH (ref 4.0–10.5)
nRBC: 0 % (ref 0.0–0.2)

## 2023-09-11 LAB — SURGICAL PCR SCREEN
MRSA, PCR: NEGATIVE
Staphylococcus aureus: NEGATIVE

## 2023-09-11 NOTE — Progress Notes (Signed)
 COVID Vaccine received:  []  No [x]  Yes Date of any COVID positive Test in last 90 days: no PCP - Camie Doing NP LOV 09/08/23 Cardiologist - Shelda Bruckner MD  Chest x-ray -  EKG -   Stress Test -  ECHO - 06/29/21  Cardiac Cath -   Bowel Prep - [x]  No  []   Yes ______  Pacemaker / ICD device [x]  No []  Yes   Spinal Cord Stimulator:[x]  No []  Yes       History of Sleep Apnea? [x]  No []  Yes   CPAP used?- [x]  No []  Yes    Does the patient monitor blood sugar?          [x]  No []  Yes  []  N/A  Patient has: []  NO Hx DM   []  Pre-DM                 []  DM1  [x]   DM2 Does patient have a [employer] or Dexacom? []  No []  Yes   Fasting Blood Sugar Ranges-  Checks Blood Sugar _____ times a day  GLP1 agonist / usual dose - no GLP1 instructions:  SGLT-2 inhibitors / usual dose - no SGLT-2 instructions:   Blood Thinner / Instructions:no Aspirin  Instructions:no  Comments:   Activity level: Patient is able to climb a flight of stairs without difficulty; [x]  No CP  [x]  No SOB,    Patient  can not perform ADLs without assistance.   Anesthesia review:   Patient denies shortness of breath, fever, cough and chest pain at PAT appointment.  Patient verbalized understanding and agreement to the Pre-Surgical Instructions that were given to them at this PAT appointment. Patient was also educated of the need to review these PAT instructions again prior to his/her surgery.I reviewed the appropriate phone numbers to call if they have any and questions or concerns.

## 2023-09-17 NOTE — Anesthesia Preprocedure Evaluation (Signed)
 Anesthesia Evaluation  Patient identified by MRN, date of birth, ID band Patient awake    Reviewed: Allergy & Precautions, NPO status , Patient's Chart, lab work & pertinent test results  History of Anesthesia Complications Negative for: history of anesthetic complications  Airway Mallampati: II  TM Distance: >3 FB Neck ROM: Full    Dental no notable dental hx.    Pulmonary asthma , former smoker   Pulmonary exam normal        Cardiovascular hypertension, Normal cardiovascular exam     Neuro/Psych   Anxiety Depression     negative psych ROS   GI/Hepatic Neg liver ROS, hiatal hernia,GERD  ,,  Endo/Other  Hypothyroidism    Renal/GU negative Renal ROS     Musculoskeletal  (+) Arthritis ,    Abdominal   Peds  Hematology negative hematology ROS (+)   Anesthesia Other Findings Day of surgery medications reviewed with patient.  Reproductive/Obstetrics                              Anesthesia Physical Anesthesia Plan  ASA: 2  Anesthesia Plan: Spinal   Post-op Pain Management: Tylenol  PO (pre-op)*   Induction:   PONV Risk Score and Plan: 3 and Treatment may vary due to age or medical condition, Ondansetron , Propofol  infusion and Dexamethasone   Airway Management Planned: Natural Airway and Simple Face Mask  Additional Equipment: None  Intra-op Plan:   Post-operative Plan:   Informed Consent: I have reviewed the patients History and Physical, chart, labs and discussed the procedure including the risks, benefits and alternatives for the proposed anesthesia with the patient or authorized representative who has indicated his/her understanding and acceptance.       Plan Discussed with: CRNA  Anesthesia Plan Comments:          Anesthesia Quick Evaluation

## 2023-09-18 ENCOUNTER — Ambulatory Visit (HOSPITAL_COMMUNITY)

## 2023-09-18 ENCOUNTER — Ambulatory Visit (HOSPITAL_COMMUNITY): Payer: Self-pay | Admitting: Anesthesiology

## 2023-09-18 ENCOUNTER — Observation Stay (HOSPITAL_COMMUNITY)
Admission: RE | Admit: 2023-09-18 | Discharge: 2023-09-19 | Disposition: A | Discharge status: Home / Self Care | Source: Ambulatory Visit | Attending: Orthopedic Surgery | Admitting: Orthopedic Surgery

## 2023-09-18 ENCOUNTER — Encounter (HOSPITAL_COMMUNITY)
Admission: RE | Disposition: A | Discharge status: Home / Self Care | Payer: Self-pay | Source: Ambulatory Visit | Attending: Orthopedic Surgery

## 2023-09-18 ENCOUNTER — Other Ambulatory Visit: Payer: Self-pay

## 2023-09-18 ENCOUNTER — Observation Stay (HOSPITAL_COMMUNITY)

## 2023-09-18 ENCOUNTER — Encounter (HOSPITAL_COMMUNITY): Payer: Self-pay | Admitting: Orthopedic Surgery

## 2023-09-18 DIAGNOSIS — Z87891 Personal history of nicotine dependence: Secondary | ICD-10-CM | POA: Diagnosis not present

## 2023-09-18 DIAGNOSIS — Z96612 Presence of left artificial shoulder joint: Secondary | ICD-10-CM | POA: Insufficient documentation

## 2023-09-18 DIAGNOSIS — E039 Hypothyroidism, unspecified: Secondary | ICD-10-CM | POA: Diagnosis not present

## 2023-09-18 DIAGNOSIS — J45909 Unspecified asthma, uncomplicated: Secondary | ICD-10-CM | POA: Insufficient documentation

## 2023-09-18 DIAGNOSIS — Z85850 Personal history of malignant neoplasm of thyroid: Secondary | ICD-10-CM | POA: Diagnosis not present

## 2023-09-18 DIAGNOSIS — I10 Essential (primary) hypertension: Secondary | ICD-10-CM | POA: Insufficient documentation

## 2023-09-18 DIAGNOSIS — Z471 Aftercare following joint replacement surgery: Secondary | ICD-10-CM | POA: Diagnosis not present

## 2023-09-18 DIAGNOSIS — Z96641 Presence of right artificial hip joint: Principal | ICD-10-CM

## 2023-09-18 DIAGNOSIS — Z96651 Presence of right artificial knee joint: Secondary | ICD-10-CM | POA: Diagnosis not present

## 2023-09-18 DIAGNOSIS — Z96642 Presence of left artificial hip joint: Secondary | ICD-10-CM | POA: Diagnosis not present

## 2023-09-18 DIAGNOSIS — Z96643 Presence of artificial hip joint, bilateral: Secondary | ICD-10-CM | POA: Diagnosis not present

## 2023-09-18 DIAGNOSIS — M1611 Unilateral primary osteoarthritis, right hip: Secondary | ICD-10-CM

## 2023-09-18 HISTORY — PX: TOTAL HIP ARTHROPLASTY: SHX124

## 2023-09-18 LAB — TYPE AND SCREEN
ABO/RH(D): O NEG
Antibody Screen: NEGATIVE

## 2023-09-18 SURGERY — ARTHROPLASTY, HIP, TOTAL, ANTERIOR APPROACH
Anesthesia: Spinal | Site: Hip | Laterality: Right

## 2023-09-18 MED ORDER — METOCLOPRAMIDE HCL 5 MG PO TABS: 5.0000 mg | ORAL_TABLET | Freq: Three times a day (TID) | ORAL | Status: DC | PRN

## 2023-09-18 MED ORDER — LACTATED RINGERS IV SOLN: INTRAVENOUS | Status: DC

## 2023-09-18 MED ORDER — CHLORHEXIDINE GLUCONATE 0.12 % MT SOLN
15.0000 mL | Freq: Once | OROMUCOSAL | Status: AC
  Administered 2023-09-18: 15 mL via OROMUCOSAL

## 2023-09-18 MED ORDER — METHOCARBAMOL 500 MG PO TABS
500.0000 mg | ORAL_TABLET | Freq: Four times a day (QID) | ORAL | Status: DC | PRN
  Administered 2023-09-18: 500 mg via ORAL
  Filled 2023-09-18: qty 1

## 2023-09-18 MED ORDER — MIDAZOLAM HCL 2 MG/2ML IJ SOLN
INTRAMUSCULAR | Status: AC
  Filled 2023-09-18: qty 2

## 2023-09-18 MED ORDER — LORATADINE 10 MG PO TABS
10.0000 mg | ORAL_TABLET | Freq: Every day | ORAL | Status: DC
  Administered 2023-09-19: 10 mg via ORAL
  Filled 2023-09-18: qty 1

## 2023-09-18 MED ORDER — DROPERIDOL 2.5 MG/ML IJ SOLN
0.6250 mg | Freq: Once | INTRAMUSCULAR | Status: AC | PRN
  Administered 2023-09-18: 0.625 mg via INTRAVENOUS

## 2023-09-18 MED ORDER — MENTHOL 3 MG MT LOZG: 1.0000 | LOZENGE | OROMUCOSAL | Status: DC | PRN

## 2023-09-18 MED ORDER — BISACODYL 10 MG RE SUPP: 10.0000 mg | Freq: Every day | RECTAL | Status: DC | PRN

## 2023-09-18 MED ORDER — SODIUM CHLORIDE (PF) 0.9 % IJ SOLN
INTRAMUSCULAR | Status: DC | PRN
  Administered 2023-09-18: 61 mL

## 2023-09-18 MED ORDER — ONDANSETRON HCL 4 MG/2ML IJ SOLN
INTRAMUSCULAR | Status: DC | PRN
  Administered 2023-09-18: 4 mg via INTRAVENOUS

## 2023-09-18 MED ORDER — DEXAMETHASONE SODIUM PHOSPHATE 10 MG/ML IJ SOLN
10.0000 mg | Freq: Once | INTRAMUSCULAR | Status: AC
  Administered 2023-09-19: 10 mg via INTRAVENOUS
  Filled 2023-09-18: qty 1

## 2023-09-18 MED ORDER — DIPHENHYDRAMINE HCL 12.5 MG/5ML PO ELIX: 12.5000 mg | ORAL_SOLUTION | ORAL | Status: DC | PRN

## 2023-09-18 MED ORDER — BUDESON-GLYCOPYRROL-FORMOTEROL 160-9-4.8 MCG/ACT IN AERO
2.0000 | INHALATION_SPRAY | Freq: Two times a day (BID) | RESPIRATORY_TRACT | Status: DC
  Administered 2023-09-18 – 2023-09-19 (×2): 2 via RESPIRATORY_TRACT
  Filled 2023-09-18: qty 5.9

## 2023-09-18 MED ORDER — TRANEXAMIC ACID-NACL 1000-0.7 MG/100ML-% IV SOLN
1000.0000 mg | INTRAVENOUS | Status: AC
  Administered 2023-09-18: 1000 mg via INTRAVENOUS
  Filled 2023-09-18: qty 100

## 2023-09-18 MED ORDER — SERTRALINE HCL 50 MG PO TABS
150.0000 mg | ORAL_TABLET | Freq: Every day | ORAL | Status: DC
  Administered 2023-09-19: 150 mg via ORAL
  Filled 2023-09-18: qty 1

## 2023-09-18 MED ORDER — LEVOTHYROXINE SODIUM 75 MCG PO TABS
150.0000 ug | ORAL_TABLET | Freq: Every day | ORAL | Status: DC
  Administered 2023-09-19: 150 ug via ORAL
  Filled 2023-09-18: qty 2

## 2023-09-18 MED ORDER — SODIUM CHLORIDE (PF) 0.9 % IJ SOLN
INTRAMUSCULAR | Status: AC
  Filled 2023-09-18: qty 50

## 2023-09-18 MED ORDER — METHOCARBAMOL 1000 MG/10ML IJ SOLN: 500.0000 mg | Freq: Four times a day (QID) | INTRAMUSCULAR | Status: DC | PRN

## 2023-09-18 MED ORDER — MIDAZOLAM HCL 5 MG/5ML IJ SOLN
INTRAMUSCULAR | Status: DC | PRN
Start: 2023-09-18 — End: 2023-09-18
  Administered 2023-09-18 (×2): 1 mg via INTRAVENOUS

## 2023-09-18 MED ORDER — FENTANYL CITRATE (PF) 100 MCG/2ML IJ SOLN
INTRAMUSCULAR | Status: AC
  Filled 2023-09-18: qty 2

## 2023-09-18 MED ORDER — HYDROMORPHONE HCL 1 MG/ML IJ SOLN: 0.5000 mg | INTRAMUSCULAR | Status: DC | PRN

## 2023-09-18 MED ORDER — ONDANSETRON HCL 4 MG/2ML IJ SOLN
INTRAMUSCULAR | Status: AC
Start: 2023-09-18 — End: 2023-09-18
  Filled 2023-09-18: qty 2

## 2023-09-18 MED ORDER — KETOROLAC TROMETHAMINE 15 MG/ML IJ SOLN
7.5000 mg | Freq: Four times a day (QID) | INTRAMUSCULAR | Status: AC
  Administered 2023-09-18 – 2023-09-19 (×4): 7.5 mg via INTRAVENOUS
  Filled 2023-09-18 (×3): qty 1

## 2023-09-18 MED ORDER — FENTANYL CITRATE (PF) 100 MCG/2ML IJ SOLN
INTRAMUSCULAR | Status: DC | PRN
  Administered 2023-09-18 (×2): 50 ug via INTRAVENOUS

## 2023-09-18 MED ORDER — BUPIVACAINE IN DEXTROSE 0.75-8.25 % IT SOLN
INTRATHECAL | Status: DC | PRN
  Administered 2023-09-18: 1.6 mL via INTRATHECAL

## 2023-09-18 MED ORDER — 0.9 % SODIUM CHLORIDE (POUR BTL) OPTIME
TOPICAL | Status: DC | PRN
  Administered 2023-09-18: 1000 mL

## 2023-09-18 MED ORDER — PROPOFOL 1000 MG/100ML IV EMUL
INTRAVENOUS | Status: AC
  Filled 2023-09-18: qty 100

## 2023-09-18 MED ORDER — BUPIVACAINE-EPINEPHRINE (PF) 0.25% -1:200000 IJ SOLN
INTRAMUSCULAR | Status: AC
Start: 2023-09-18 — End: 2023-09-18
  Filled 2023-09-18: qty 30

## 2023-09-18 MED ORDER — FLUTICASONE PROPIONATE 50 MCG/ACT NA SUSP
1.0000 | Freq: Every day | NASAL | Status: DC
  Administered 2023-09-19: 1 via NASAL
  Filled 2023-09-18: qty 16

## 2023-09-18 MED ORDER — ALPRAZOLAM 0.25 MG PO TABS: 0.2500 mg | ORAL_TABLET | Freq: Two times a day (BID) | ORAL | Status: DC | PRN

## 2023-09-18 MED ORDER — METOCLOPRAMIDE HCL 5 MG/ML IJ SOLN: 5.0000 mg | Freq: Three times a day (TID) | INTRAMUSCULAR | Status: DC | PRN

## 2023-09-18 MED ORDER — KETOROLAC TROMETHAMINE 30 MG/ML IJ SOLN
INTRAMUSCULAR | Status: AC
Start: 2023-09-18 — End: 2023-09-18
  Filled 2023-09-18: qty 1

## 2023-09-18 MED ORDER — ALUM & MAG HYDROXIDE-SIMETH 200-200-20 MG/5ML PO SUSP: 30.0000 mL | ORAL | Status: DC | PRN

## 2023-09-18 MED ORDER — POLYETHYLENE GLYCOL 3350 17 G PO PACK
17.0000 g | PACK | Freq: Two times a day (BID) | ORAL | Status: DC
  Administered 2023-09-18 – 2023-09-19 (×2): 17 g via ORAL
  Filled 2023-09-18 (×2): qty 1

## 2023-09-18 MED ORDER — CEFAZOLIN SODIUM-DEXTROSE 2-4 GM/100ML-% IV SOLN
2.0000 g | Freq: Four times a day (QID) | INTRAVENOUS | Status: AC
  Administered 2023-09-18 (×2): 2 g via INTRAVENOUS
  Filled 2023-09-18 (×2): qty 100

## 2023-09-18 MED ORDER — PHENOL 1.4 % MT LIQD: 1.0000 | OROMUCOSAL | Status: DC | PRN

## 2023-09-18 MED ORDER — HYDROMORPHONE HCL 1 MG/ML IJ SOLN
INTRAMUSCULAR | Status: AC
Start: 2023-09-18 — End: 2023-09-18
  Filled 2023-09-18: qty 1

## 2023-09-18 MED ORDER — SODIUM CHLORIDE 0.9 % IV SOLN: INTRAVENOUS | Status: DC

## 2023-09-18 MED ORDER — DEXAMETHASONE SODIUM PHOSPHATE 10 MG/ML IJ SOLN
INTRAMUSCULAR | Status: AC
Start: 2023-09-18 — End: 2023-09-18
  Filled 2023-09-18: qty 1

## 2023-09-18 MED ORDER — METHOCARBAMOL 500 MG PO TABS
ORAL_TABLET | ORAL | Status: AC
Start: 2023-09-18 — End: 2023-09-18
  Filled 2023-09-18: qty 1

## 2023-09-18 MED ORDER — CEFAZOLIN SODIUM-DEXTROSE 2-4 GM/100ML-% IV SOLN
2.0000 g | INTRAVENOUS | Status: AC
  Administered 2023-09-18: 2 g via INTRAVENOUS
  Filled 2023-09-18: qty 100

## 2023-09-18 MED ORDER — ONDANSETRON HCL 4 MG/2ML IJ SOLN: 4.0000 mg | Freq: Four times a day (QID) | INTRAMUSCULAR | Status: DC | PRN

## 2023-09-18 MED ORDER — POVIDONE-IODINE 10 % EX SWAB: 2.0000 | Freq: Once | CUTANEOUS | Status: DC

## 2023-09-18 MED ORDER — BUSPIRONE HCL 5 MG PO TABS
7.5000 mg | ORAL_TABLET | Freq: Two times a day (BID) | ORAL | Status: DC
  Administered 2023-09-18 – 2023-09-19 (×3): 7.5 mg via ORAL
  Filled 2023-09-18 (×3): qty 2

## 2023-09-18 MED ORDER — ACETAMINOPHEN 500 MG PO TABS
1000.0000 mg | ORAL_TABLET | Freq: Four times a day (QID) | ORAL | Status: DC
  Administered 2023-09-18 – 2023-09-19 (×4): 1000 mg via ORAL
  Filled 2023-09-18 (×3): qty 2

## 2023-09-18 MED ORDER — HYDROCHLOROTHIAZIDE 25 MG PO TABS
25.0000 mg | ORAL_TABLET | Freq: Every morning | ORAL | Status: DC
  Administered 2023-09-19: 25 mg via ORAL
  Filled 2023-09-18: qty 1

## 2023-09-18 MED ORDER — PROPOFOL 500 MG/50ML IV EMUL
INTRAVENOUS | Status: DC | PRN
  Administered 2023-09-18: 100 ug/kg/min via INTRAVENOUS

## 2023-09-18 MED ORDER — ALBUTEROL SULFATE HFA 108 (90 BASE) MCG/ACT IN AERS: 2.0000 | INHALATION_SPRAY | Freq: Four times a day (QID) | RESPIRATORY_TRACT | Status: DC | PRN

## 2023-09-18 MED ORDER — ASPIRIN 81 MG PO CHEW
81.0000 mg | CHEWABLE_TABLET | Freq: Two times a day (BID) | ORAL | Status: DC
  Administered 2023-09-18 – 2023-09-19 (×2): 81 mg via ORAL
  Filled 2023-09-18 (×2): qty 1

## 2023-09-18 MED ORDER — PROPOFOL 10 MG/ML IV BOLUS
INTRAVENOUS | Status: DC | PRN
  Administered 2023-09-18: 20 mg via INTRAVENOUS

## 2023-09-18 MED ORDER — TRANEXAMIC ACID-NACL 1000-0.7 MG/100ML-% IV SOLN
1000.0000 mg | Freq: Once | INTRAVENOUS | Status: AC
  Administered 2023-09-18: 1000 mg via INTRAVENOUS
  Filled 2023-09-18: qty 100

## 2023-09-18 MED ORDER — ONDANSETRON HCL 4 MG PO TABS: 4.0000 mg | ORAL_TABLET | Freq: Four times a day (QID) | ORAL | Status: DC | PRN

## 2023-09-18 MED ORDER — DROPERIDOL 2.5 MG/ML IJ SOLN
INTRAMUSCULAR | Status: AC
Start: 2023-09-18 — End: 2023-09-18
  Filled 2023-09-18: qty 2

## 2023-09-18 MED ORDER — PHENYLEPHRINE HCL-NACL 20-0.9 MG/250ML-% IV SOLN
INTRAVENOUS | Status: DC | PRN
Start: 2023-09-18 — End: 2023-09-18
  Administered 2023-09-18: 25 ug/min via INTRAVENOUS

## 2023-09-18 MED ORDER — DEXAMETHASONE SODIUM PHOSPHATE 10 MG/ML IJ SOLN
8.0000 mg | Freq: Once | INTRAMUSCULAR | Status: AC
  Administered 2023-09-18: 8 mg via INTRAVENOUS

## 2023-09-18 MED ORDER — SENNA 8.6 MG PO TABS
2.0000 | ORAL_TABLET | Freq: Every day | ORAL | Status: DC
  Administered 2023-09-18: 17.2 mg via ORAL
  Filled 2023-09-18: qty 2

## 2023-09-18 MED ORDER — HYDROMORPHONE HCL 1 MG/ML IJ SOLN
0.2500 mg | INTRAMUSCULAR | Status: DC | PRN
  Administered 2023-09-18: 0.25 mg via INTRAVENOUS
  Administered 2023-09-18: 0.5 mg via INTRAVENOUS
  Administered 2023-09-18: 0.25 mg via INTRAVENOUS

## 2023-09-18 MED ORDER — HYDROMORPHONE HCL 2 MG PO TABS
2.0000 mg | ORAL_TABLET | ORAL | Status: DC | PRN
  Filled 2023-09-18 (×2): qty 1

## 2023-09-18 MED ORDER — MAGNESIUM OXIDE -MG SUPPLEMENT 400 (240 MG) MG PO TABS
400.0000 mg | ORAL_TABLET | Freq: Every day | ORAL | Status: DC
  Administered 2023-09-19: 400 mg via ORAL
  Filled 2023-09-18: qty 1

## 2023-09-18 MED ORDER — HYDROMORPHONE HCL 2 MG PO TABS
1.0000 mg | ORAL_TABLET | ORAL | Status: DC | PRN
  Administered 2023-09-18 – 2023-09-19 (×3): 2 mg via ORAL
  Filled 2023-09-18 (×2): qty 1

## 2023-09-18 SURGICAL SUPPLY — 38 items
BAG COUNTER SPONGE SURGICOUNT (BAG) IMPLANT
BAG ZIPLOCK 12X15 (MISCELLANEOUS) ×1 IMPLANT
BLADE SAG 18X100X1.27 (BLADE) ×1 IMPLANT
COVER PERINEAL POST (MISCELLANEOUS) ×1 IMPLANT
COVER SURGICAL LIGHT HANDLE (MISCELLANEOUS) ×1 IMPLANT
CUP ACETBLR 52 OD PINNACLE (Hips) IMPLANT
DERMABOND ADVANCED .7 DNX12 (GAUZE/BANDAGES/DRESSINGS) ×1 IMPLANT
DRAPE FOOT SWITCH (DRAPES) ×1 IMPLANT
DRAPE STERI IOBAN 125X83 (DRAPES) ×1 IMPLANT
DRAPE U-SHAPE 47X51 STRL (DRAPES) ×2 IMPLANT
DRESSING AQUACEL AG SP 3.5X10 (GAUZE/BANDAGES/DRESSINGS) ×1 IMPLANT
DURAPREP 26ML APPLICATOR (WOUND CARE) ×1 IMPLANT
ELECT REM PT RETURN 15FT ADLT (MISCELLANEOUS) ×1 IMPLANT
FEMORAL STEM 12/14 TPR SZ4 HIP (Orthopedic Implant) IMPLANT
GLOVE BIO SURGEON STRL SZ 6 (GLOVE) ×1 IMPLANT
GLOVE BIOGEL PI IND STRL 6.5 (GLOVE) ×1 IMPLANT
GLOVE BIOGEL PI IND STRL 7.5 (GLOVE) ×1 IMPLANT
GLOVE ORTHO TXT STRL SZ7.5 (GLOVE) ×2 IMPLANT
GOWN STRL REUS W/ TWL LRG LVL3 (GOWN DISPOSABLE) ×2 IMPLANT
HEAD CERAMIC 36 PLUS5 (Hips) IMPLANT
HOLDER FOLEY CATH W/STRAP (MISCELLANEOUS) ×1 IMPLANT
KIT TURNOVER KIT A (KITS) ×1 IMPLANT
LINER NEUTRAL 52X36MM PLUS 4 (Liner) IMPLANT
MANIFOLD NEPTUNE II (INSTRUMENTS) ×1 IMPLANT
NDL SAFETY ECLIPSE 18X1.5 (NEEDLE) ×1 IMPLANT
PACK ANTERIOR HIP CUSTOM (KITS) ×1 IMPLANT
PENCIL SMOKE EVACUATOR (MISCELLANEOUS) ×1 IMPLANT
SCREW 6.5MMX30MM (Screw) IMPLANT
SUT MNCRL AB 4-0 PS2 18 (SUTURE) ×1 IMPLANT
SUT VIC AB 1 CT1 36 (SUTURE) ×3 IMPLANT
SUT VIC AB 2-0 CT1 TAPERPNT 27 (SUTURE) ×2 IMPLANT
SUTURE STRATFX 0 PDS 27 VIOLET (SUTURE) ×1 IMPLANT
SYR 3ML LL SCALE MARK (SYRINGE) ×1 IMPLANT
TOWEL GREEN STERILE FF (TOWEL DISPOSABLE) ×1 IMPLANT
TRAY FOLEY MTR SLVR 14FR STAT (SET/KITS/TRAYS/PACK) IMPLANT
TRAY FOLEY MTR SLVR 16FR STAT (SET/KITS/TRAYS/PACK) ×1 IMPLANT
TUBE SUCTION HIGH CAP CLEAR NV (SUCTIONS) ×1 IMPLANT
WATER STERILE IRR 1000ML POUR (IV SOLUTION) ×1 IMPLANT

## 2023-09-18 NOTE — Op Note (Signed)
 NAME:  [name]                ACCOUNT NO.: [id number]      MEDICAL RECORD NO.: [id number]      FACILITY:  [facility]      PHYSICIAN:  Donnice JONETTA Car  DATE OF BIRTH:  [birth year]     DATE OF PROCEDURE:  09/18/2023                                 OPERATIVE REPORT         PREOPERATIVE DIAGNOSIS: Right  hip osteoarthritis.      POSTOPERATIVE DIAGNOSIS:  Right hip osteoarthritis.      PROCEDURE:  Right total hip replacement through an anterior approach   utilizing DePuy THR system, component size 52 mm pinnacle cup, a size 36+4 neutral   Altrex liner, a size 4Hi Actis stem with a 36+5 delta ceramic   ball.      SURGEON:  Donnice JONETTA. Car, M.D.      ASSISTANT:  Rosina Calin, PA-C     ANESTHESIA:  Spinal.      SPECIMENS:  None.      COMPLICATIONS:  None.      BLOOD LOSS:  800 cc     DRAINS:  None.      INDICATION OF THE PROCEDURE:  [name] is a 68 y.o. female who had   presented to office for evaluation of right hip pain.  Radiographs revealed   progressive degenerative changes with bone-on-bone   articulation of the  hip joint, including subchondral cystic changes and osteophytes.  The patient had painful limited range of   motion significantly affecting their overall quality of life and function.  The patient was failing to    respond to conservative measures including medications and/or injections and activity modification and at this point was ready   to proceed with more definitive measures.  Consent was obtained for   benefit of pain relief.  Specific risks of infection, DVT, component   failure, dislocation, neurovascular injury, and need for revision surgery were reviewed in the office.     PROCEDURE IN DETAIL:  The patient was brought to operative theater.   Once adequate anesthesia, preoperative antibiotics, 2 gm of Ancef , 1 gm of Tranexamic Acid , and 10 mg of Decadron  were administered, the patient was positioned supine on the [employer] table.  Once the patient was safely positioned with adequate padding of boney prominences we predraped out the hip, and used fluoroscopy to confirm orientation of the pelvis.      The right hip was then prepped and draped from proximal iliac crest to   mid thigh with a shower curtain technique.      Time-out was performed identifying the patient, planned procedure, and the appropriate extremity.     An incision was then made 2 cm lateral to the   anterior superior iliac spine extending over the orientation of the   tensor fascia lata muscle and sharp dissection was carried down to the   fascia of the muscle.      The fascia was then incised.  The muscle belly was identified and swept   laterally and retractor placed along the superior neck.  Following   cauterization of the circumflex vessels and removing some pericapsular   fat, a second cobra retractor was placed on the inferior neck.  A T-capsulotomy was made along the line of the   superior neck to the trochanteric fossa, then extended proximally and   distally.  Tag sutures were placed and the retractors were then placed   intracapsular.  We then identified the trochanteric fossa and   orientation of my neck cut and then made a neck osteotomy with the femur on traction.  The femoral   head was removed without difficulty or complication.  Traction was let   off and retractors were placed posterior and anterior around the   acetabulum.      The labrum and foveal tissue were debrided.  I began reaming with a 47 mm   reamer and reamed up to 51 mm reamer with good bony bed preparation and a 52 mm  cup was chosen.  The final 52 mm Pinnacle cup was then impacted under fluoroscopy to confirm the depth of penetration and orientation with respect to   Abduction and forward flexion.  A screw was placed into the ilium followed by the hole eliminator.  The final   36+4 neutral Altrex liner was impacted with good visualized rim fit.  The cup  was positioned anatomically within the acetabular portion of the pelvis.      At this point, the femur was rolled to 100 degrees.  Further capsule was   released off the inferior aspect of the femoral neck.  I then   released the superior capsule proximally.  With the leg in a neutral position the hook was placed laterally   along the femur under the vastus lateralis origin and elevated manually and then held in position using the hook attachment on the bed.  The leg was then extended and adducted with the leg rolled to 100   degrees of external rotation.  Retractors were placed along the medial calcar and posteriorly over the greater trochanter.  Once the proximal femur was fully   exposed, I used a box osteotome to set orientation.  I then began   broaching with the starting chili pepper broach and passed this by hand and then broached up to 4.  With the 4 broach in place I chose a offset neck and did several trial reductions.  The offset was appropriate, leg lengths   appeared to be equal best matched with the +5 head ball trial confirmed radiographically.   Given these findings, I went ahead and dislocated the hip, repositioned all   retractors and positioned the right hip in the extended and abducted position.  The final 4 Hi Actis stem was   chosen and it was impacted down to the level of neck cut.  Based on this   and the trial reductions, a final 36+5 delta ceramic ball was chosen and   impacted onto a clean and dry trunnion, and the hip was reduced.  The   hip had been irrigated throughout the case again at this point.  I did   reapproximate the superior capsular leaflet to the anterior leaflet   using #1 Vicryl.  The fascia of the   tensor fascia lata muscle was then reapproximated using #1 Vicryl and #0 Stratafix sutures.  The   remaining wound was closed with 2-0 Vicryl and running 4-0 Monocryl.   The hip was cleaned, dried, and dressed sterilely using Dermabond and   Aquacel  dressing.  The patient was then brought   to recovery room in stable condition tolerating the procedure well.    Rosina Calin, PA-C was  present for the entirety of the case involved from   preoperative positioning, perioperative retractor management, general   facilitation of the case, as well as primary wound closure as assistant.            Donnice CORDOBA Ernie, M.D.        09/18/2023 8:43 AM

## 2023-09-18 NOTE — Anesthesia Postprocedure Evaluation (Signed)
 Anesthesia Post Note  Patient: [name]  Procedure(s) Performed: ARTHROPLASTY, HIP, TOTAL, ANTERIOR APPROACH (Right: Hip)     Patient location during evaluation: PACU Anesthesia Type: Spinal Level of consciousness: awake and alert Pain management: pain level controlled Vital Signs Assessment: post-procedure vital signs reviewed and stable Respiratory status: spontaneous breathing, nonlabored ventilation and respiratory function stable Cardiovascular status: blood pressure returned to baseline Postop Assessment: no apparent nausea or vomiting, spinal receding, no headache and no backache Anesthetic complications: no   No notable events documented.  Last Vitals:  Vitals:   09/18/23 1130 09/18/23 1145  BP: (!) 114/58 (!) 105/56  Pulse: (!) 55 (!) 57  Resp: (!) 8 16  Temp:  36.7 C  SpO2: 97% 96%    Last Pain:  Vitals:   09/18/23 1145  TempSrc:   PainSc: Asleep                 Vertell Row

## 2023-09-18 NOTE — H&P (Signed)
 TOTAL HIP ADMISSION H&P  Patient is admitted for right total hip arthroplasty.  Therapy Plans: HEP Disposition: Home with husband Planned DVT Prophylaxis: aspirin  81mg  BID DME needed: none PCP: Camie Doing - clearance received TXA: IV Allergies: oxycodone - dizziness, mango skin Anesthesia Concerns: none BMI: 36.6 Last HgbA1c: Not diabetic   Other: - Hx of left THA in 2022 - did well - hx of thyroid  cancer - s/p thyroidectomy - hydromorphone , tylenol , celebrex , robaxin  - staying overnight  Subjective:  Chief Complaint: right hip pain  HPI: [name], 68 y.o. female, has a history of pain and functional disability in the right hip(s) due to arthritis and patient has failed non-surgical conservative treatments for greater than 12 weeks to include NSAID's and/or analgesics and activity modification.  Onset of symptoms was abrupt starting 1 years ago with rapidlly worsening course since that time.The patient noted no past surgery on the right hip(s).  Patient currently rates pain in the right hip at 8 out of 10 with activity. Patient has worsening of pain with activity and weight bearing, pain that interfers with activities of daily living, and pain with passive range of motion. Patient has evidence of joint space narrowing by imaging studies. This condition presents safety issues increasing the risk of falls. There is no current active infection.  Patient Active Problem List   Diagnosis Date Noted   Pre-op evaluation 09/08/2023   Primary hypertension 04/07/2022   H/O total shoulder replacement, left 02/08/2022   Pain in joint of right hip 01/17/2022   Asthma due to environmental allergies 01/15/2022   Other mixed anxiety disorders 11/29/2021   Iron deficiency anemia secondary to inadequate dietary iron intake 01/23/2021   Other fatigue 01/23/2021   Annual physical exam 01/23/2021   S/P total knee arthroplasty, right 11/28/2020   Hammer toe 10/14/2017   Past Medical  History:  Diagnosis Date   Allergy    Anemia    Anxiety    Arthritis    Asthma    Cancer (HCC) 2023   Thyroid    Depression    Dyspnea    GERD (gastroesophageal reflux disease)    Goiter    History of hiatal hernia    Hypertension    Hypothyroidism    Pain in joint of left shoulder 12/11/2021    Past Surgical History:  Procedure Laterality Date   CHOLECYSTECTOMY     FOOT SURGERY     HAMMER TOE SURGERY Bilateral    All toes on both feet   JOINT REPLACEMENT     KNEE SURGERY Left    TKA x2    1974, 2014   SHOULDER SURGERY Bilateral    Left Hemiarthroplasty,  total arthroplasty on Right shoulder   THYROIDECTOMY N/A 07/05/2021   Procedure: TOTAL THYROIDECTOMY;  Surgeon: Stevie Herlene Righter, MD;  Location: [facility];  Service: General;  Laterality: N/A;   TOTAL HIP ARTHROPLASTY Left    TOTAL KNEE ARTHROPLASTY Right 11/28/2020   Procedure: TOTAL KNEE ARTHROPLASTY;  Surgeon: Ernie Cough, MD;  Location: [facility];  Service: Orthopedics;  Laterality: Right;   TOTAL SHOULDER ARTHROPLASTY Left 02/08/2022   Procedure: SHOULDER REVISION TO TOTAL SHOULDER ARTHROPLASTY;  Surgeon: Kay Kemps, MD;  Location: [facility];  Service: Orthopedics;  Laterality: Left;  120 MIN CHOICE WITH INTERSCALENE BLOCK   WISDOM TOOTH EXTRACTION     XI ROBOTIC ASSISTED HIATAL HERNIA REPAIR N/A 08/20/2021   Procedure: XI ROBOTIC ASSISTED HIATAL HERNIA REPAIR WITH PARTIAL FUNDOPLICATION AND EGD;  Surgeon: Stevie Herlene  Beverley, MD;  Location: [facility];  Service: General;  Laterality: N/A;    Current Facility-Administered Medications  Medication Dose Route Frequency Provider Last Rate Last Admin   ceFAZolin  (ANCEF ) IVPB 2g/100 mL premix  2 g Intravenous On Call to OR Patti Rosina SAUNDERS, PA-C       dexamethasone  (DECADRON ) injection 8 mg  8 mg Intravenous Once Patti Rosina SAUNDERS, PA-C       povidone-iodine  10 % swab 2 Application  2 Application Topical Once Patti Rosina SAUNDERS, PA-C       tranexamic acid  (CYKLOKAPRON )  IVPB 1,000 mg  1,000 mg Intravenous To OR Patti Rosina SAUNDERS, PA-C       Allergies  Allergen Reactions   Mango Flavor [Flavoring Agent (Non-Screening)] Anaphylaxis    Specifically mango peel , patient can eat the fruit itself   Oxycontin [Oxycodone] Other (See Comments)    Dizziness    Social History   Tobacco Use   Smoking status: Former    Current packs/day: 0.00    Average packs/day: 1 pack/day for 4.0 years (4.0 ttl pk-yrs)    Types: Cigarettes    Start date: 19    Quit date: 22    Years since quitting: 51.5   Smokeless tobacco: Never  Substance Use Topics   Alcohol use: Yes    Comment: Wine occasional    Family History  Problem Relation Age of Onset   Heart disease Mother    Diabetes Mother    Hypertension Mother    Vision loss Mother    Varicose Veins Mother    Breast cancer Maternal Grandmother    Emphysema Paternal Grandmother        never smoked but chewed tobacco   Arthritis Paternal Grandmother    Alcohol abuse Father    Obesity Father    Alcohol abuse Sister    Drug abuse Sister      Review of Systems  Constitutional:  Negative for chills and fever.  Respiratory:  Negative for cough and shortness of breath.   Cardiovascular:  Negative for chest pain.  Gastrointestinal:  Negative for nausea and vomiting.  Musculoskeletal:  Positive for arthralgias.     Objective:  Physical Exam Well nourished and well developed. General: Alert and oriented x3, cooperative and pleasant, no acute distress.   Musculoskeletal: Right hip exam: Painful and limited hip flexion internal rotation to 5 degrees with pelvic tilting, external rotation to 20 degrees Mild external rotation contracture with active hip flexion Maintenance of full strength   Left hip exam: Fluid range of motion without reproducible pain 5/5 strength with hip flexion and abduction  Vital signs in last 24 hours: Temp:  [98.6 F (37 C)] 98.6 F (37 C) (07/17 9367) Pulse Rate:  [57] 57  (07/17 0632) Resp:  [18] 18 (07/17 0632) BP: (139)/(71) 139/71 (07/17 0632) SpO2:  [96 %] 96 % (07/17 9367)  Labs:   Estimated body mass index is 34.28 kg/m as calculated from the following:   Height as of 09/11/23: 5' 5 (1.651 m).   Weight as of 09/11/23: 93.4 kg.   Imaging Review Plain radiographs demonstrate severe degenerative joint disease of the right hip(s). The bone quality appears to be adequate for age and reported activity level.      Assessment/Plan:  End stage arthritis, right hip(s)  The patient history, physical examination, clinical judgement of the provider and imaging studies are consistent with end stage degenerative joint disease of the right hip(s) and total hip arthroplasty is  deemed medically necessary. The treatment options including medical management, injection therapy, arthroscopy and arthroplasty were discussed at length. The risks and benefits of total hip arthroplasty were presented and reviewed. The risks due to aseptic loosening, infection, stiffness, dislocation/subluxation,  thromboembolic complications and other imponderables were discussed.  The patient acknowledged the explanation, agreed to proceed with the plan and consent was signed. Patient is being admitted for inpatient treatment for surgery, pain control, PT, OT, prophylactic antibiotics, VTE prophylaxis, progressive ambulation and ADL's and discharge planning.The patient is planning to be discharged home.    Patient's anticipated LOS is less than 2 midnights, meeting these requirements: - Younger than 68 - Lives within 1 hour of care - Has a competent adult at home to recover with post-op recover - NO history of  - Chronic pain requiring opiods  - Diabetes  - Coronary Artery Disease  - Heart failure  - Heart attack  - Stroke  - DVT/VTE  - Cardiac arrhythmia  - Respiratory Failure/COPD  - Renal failure  - Anemia  - Advanced Liver disease  Rosina Calin, PA-C Orthopedic  Surgery [facility] [place] [phone]

## 2023-09-18 NOTE — Transfer of Care (Signed)
 Immediate Anesthesia Transfer of Care Note  Patient: [name]  Procedure(s) Performed: ARTHROPLASTY, HIP, TOTAL, ANTERIOR APPROACH (Right: Hip)  Patient Location: PACU  Anesthesia Type:MAC and Spinal  Level of Consciousness: awake, alert , and oriented  Airway & Oxygen Therapy: Patient Spontanous Breathing and Patient connected to face mask oxygen  Post-op Assessment: Report given to RN and Post -op Vital signs reviewed and stable  Post vital signs: Reviewed and stable  Last Vitals:  Vitals Value Taken Time  BP    Temp    Pulse 56 09/18/23 10:08  Resp 15 09/18/23 10:08  SpO2 99 % 09/18/23 10:08  Vitals shown include unfiled device data.  Last Pain:  Vitals:   09/18/23 0656  TempSrc:   PainSc: 0-No pain         Complications: No notable events documented.

## 2023-09-18 NOTE — Anesthesia Procedure Notes (Signed)
 Spinal  Patient location during procedure: OR Start time: 09/18/2023 8:37 AM End time: 09/18/2023 8:40 AM Reason for block: surgical anesthesia Staffing Performed: anesthesiologist  Anesthesiologist: Paul Lamarr BRAVO, MD Performed by: Paul Lamarr BRAVO, MD Authorized by: Paul Lamarr BRAVO, MD   Preanesthetic Checklist Completed: patient identified, IV checked, risks and benefits discussed, surgical consent, monitors and equipment checked, pre-op evaluation and timeout performed Spinal Block Patient position: sitting Prep: DuraPrep and site prepped and draped Patient monitoring: continuous pulse ox, blood pressure and heart rate Approach: midline Location: L3-4 Injection technique: single-shot Needle Needle type: Pencan  Needle gauge: 24 G Needle length: 9 cm Assessment Events: CSF return Additional Notes Risks, benefits, and alternative discussed. Patient gave consent to procedure. Prepped and draped in sitting position. Patient sedated but responsive to voice. Clear CSF obtained after one needle redirection. Positive terminal aspiration. No pain or paraesthesias with injection. Patient tolerated procedure well. Vital signs stable. LANEY Paul, MD

## 2023-09-18 NOTE — Evaluation (Signed)
 Physical Therapy Evaluation Patient Details Name: [name] MRN: [id number] DOB: 1955-12-29 Today's Date: 09/18/2023  History of Present Illness  Pt s/p R THR and with hx of bil TSR, bil TKR, and L THR  Clinical Impression  Pt s/p R THR and presents with decreased R LE strength/ROM and post op pain limiting functional mobility.  Pt should progress to dc home with family assist.      If plan is discharge home, recommend the following: A little help with walking and/or transfers;A little help with bathing/dressing/bathroom;Assistance with cooking/housework;Assist for transportation;Help with stairs or ramp for entrance   Can travel by private vehicle        Equipment Recommendations Rolling walker (2 wheels)  Recommendations for Other Services       Functional Status Assessment Patient has had a recent decline in their functional status and demonstrates the ability to make significant improvements in function in a reasonable and predictable amount of time.     Precautions / Restrictions Precautions Precautions: Fall Recall of Precautions/Restrictions: Intact Restrictions Weight Bearing Restrictions Per Provider Order: No Other Position/Activity Restrictions: WBAT      Mobility  Bed Mobility Overal bed mobility: Needs Assistance Bed Mobility: Supine to Sit     Supine to sit: Contact guard     General bed mobility comments: cues for sequence    Transfers Overall transfer level: Needs assistance Equipment used: Rolling walker (2 wheels) Transfers: Sit to/from Stand Sit to Stand: Contact guard assist           General transfer comment: Steady assist with cues for LE management and use of UEs to self assist    Ambulation/Gait Ambulation/Gait assistance: Min assist, Contact guard assist Gait Distance (Feet): 90 Feet Assistive device: Rolling walker (2 wheels) Gait Pattern/deviations: Step-to pattern, Step-through pattern, Decreased step length - right,  Decreased step length - left, Shuffle, Trunk flexed       General Gait Details: cues for sequence, posture and position from [employer]            Wheelchair Mobility     Tilt Bed    Modified Rankin (Stroke Patients Only)       Balance Overall balance assessment: Needs assistance Sitting-balance support: No upper extremity supported, Feet supported Sitting balance-Leahy Scale: Good     Standing balance support: Bilateral upper extremity supported Standing balance-Leahy Scale: Poor                               Pertinent Vitals/Pain Pain Assessment Pain Assessment: 0-10 Pain Score: 3  Pain Location: R hip Pain Descriptors / Indicators: Aching, Sore Pain Intervention(s): Limited activity within patient's tolerance, Monitored during session, Premedicated before session, Ice applied    Home Living Family/patient expects to be discharged to:: Private residence Living Arrangements: Spouse/significant other Available Help at Discharge: Family;Available 24 hours/day Type of Home: House Home Access: Stairs to enter Entrance Stairs-Rails: Right;Left;Can reach both Entrance Stairs-Number of Steps: 4 Alternate Level Stairs-Number of Steps: 20 Home Layout: Multi-level;Bed/bath upstairs Home Equipment: Agricultural consultant (2 wheels);Cane - single point;BSC/3in1 Additional Comments: Plans to initially stay on 2nd floor    Prior Function Prior Level of Function : Independent/Modified Independent                     Extremity/Trunk Assessment   Upper Extremity Assessment Upper Extremity Assessment: Overall WFL for tasks assessed    Lower Extremity Assessment Lower  Extremity Assessment: RLE deficits/detail    Cervical / Trunk Assessment Cervical / Trunk Assessment: Normal  Communication   Communication Communication: No apparent difficulties    Cognition Arousal: Alert Behavior During Therapy: WFL for tasks assessed/performed   PT - Cognitive  impairments: No apparent impairments                         Following commands: Intact       Cueing Cueing Techniques: Verbal cues     General Comments      Exercises Total Joint Exercises Ankle Circles/Pumps: AROM, Both, 15 reps, Supine   Assessment/Plan    PT Assessment Patient needs continued PT services  PT Problem List Decreased strength;Decreased range of motion;Decreased activity tolerance;Decreased balance;Decreased mobility;Decreased knowledge of use of DME;Pain       PT Treatment Interventions DME instruction;Gait training;Stair training;Functional mobility training;Therapeutic activities;Therapeutic exercise;Patient/family education    PT Goals (Current goals can be found in the Care Plan section)  Acute Rehab PT Goals Patient Stated Goal: Regain IND PT Goal Formulation: With patient Time For Goal Achievement: 09/25/23 Potential to Achieve Goals: Good    Frequency 7X/week     Co-evaluation               AM-PAC PT 6 Clicks Mobility  Outcome Measure Help needed turning from your back to your side while in a flat bed without using bedrails?: A Little Help needed moving from lying on your back to sitting on the side of a flat bed without using bedrails?: A Little Help needed moving to and from a bed to a chair (including a wheelchair)?: A Little Help needed standing up from a chair using your arms (e.g., wheelchair or bedside chair)?: A Little Help needed to walk in hospital room?: A Little Help needed climbing 3-5 steps with a railing? : A Lot 6 Click Score: 17    End of Session Equipment Utilized During Treatment: Gait belt Activity Tolerance: Patient tolerated treatment well Patient left: in chair;with call bell/phone within reach;with chair alarm set Nurse Communication: Mobility status PT Visit Diagnosis: Difficulty in walking, not elsewhere classified (R26.2)    Time: 8494-8468 PT Time Calculation (min) (ACUTE ONLY): 26  min   Charges:   PT Evaluation $PT Eval Low Complexity: 1 Low PT Treatments $Gait Training: 8-22 mins PT General Charges $$ ACUTE PT VISIT: 1 Visit         [facility] PT Acute Rehabilitation Services Office [phone]   Kenshin Splawn 09/18/2023, 3:42 PM

## 2023-09-18 NOTE — Discharge Instructions (Signed)

## 2023-09-18 NOTE — Anesthesia Procedure Notes (Signed)
 Procedure Name: MAC Date/Time: 09/18/2023 8:34 AM  Performed by: Zulema Leita PARAS, CRNAPre-anesthesia Checklist: Patient identified, Emergency Drugs available, Suction available and Patient being monitored Oxygen Delivery Method: Simple face mask

## 2023-09-19 ENCOUNTER — Other Ambulatory Visit (HOSPITAL_COMMUNITY): Payer: Self-pay

## 2023-09-19 ENCOUNTER — Encounter (HOSPITAL_COMMUNITY): Payer: Self-pay | Admitting: Orthopedic Surgery

## 2023-09-19 DIAGNOSIS — J45909 Unspecified asthma, uncomplicated: Secondary | ICD-10-CM | POA: Diagnosis not present

## 2023-09-19 DIAGNOSIS — Z87891 Personal history of nicotine dependence: Secondary | ICD-10-CM | POA: Diagnosis not present

## 2023-09-19 DIAGNOSIS — M1611 Unilateral primary osteoarthritis, right hip: Secondary | ICD-10-CM | POA: Diagnosis not present

## 2023-09-19 DIAGNOSIS — I10 Essential (primary) hypertension: Secondary | ICD-10-CM | POA: Diagnosis not present

## 2023-09-19 DIAGNOSIS — Z96612 Presence of left artificial shoulder joint: Secondary | ICD-10-CM | POA: Diagnosis not present

## 2023-09-19 DIAGNOSIS — Z96642 Presence of left artificial hip joint: Secondary | ICD-10-CM | POA: Diagnosis not present

## 2023-09-19 DIAGNOSIS — E039 Hypothyroidism, unspecified: Secondary | ICD-10-CM | POA: Diagnosis not present

## 2023-09-19 DIAGNOSIS — Z96651 Presence of right artificial knee joint: Secondary | ICD-10-CM | POA: Diagnosis not present

## 2023-09-19 DIAGNOSIS — Z85850 Personal history of malignant neoplasm of thyroid: Secondary | ICD-10-CM | POA: Diagnosis not present

## 2023-09-19 LAB — BASIC METABOLIC PANEL WITH GFR
Anion gap: 9 (ref 5–15)
BUN: 22 mg/dL (ref 8–23)
CO2: 25 mmol/L (ref 22–32)
Calcium: 8.1 mg/dL — ABNORMAL LOW (ref 8.9–10.3)
Chloride: 105 mmol/L (ref 98–111)
Creatinine, Ser: 0.35 mg/dL — ABNORMAL LOW (ref 0.44–1.00)
GFR, Estimated: >60 mL/min (ref >60)
Glucose, Bld: 129 mg/dL — ABNORMAL HIGH (ref 70–99)
Potassium: 4.4 mmol/L (ref 3.5–5.1)
Sodium: 139 mmol/L (ref 135–145)

## 2023-09-19 LAB — CBC
HCT: 28.6 % — ABNORMAL LOW (ref 36.0–46.0)
Hemoglobin: 9 g/dL — ABNORMAL LOW (ref 12.0–15.0)
MCH: 28.9 pg (ref 26.0–34.0)
MCHC: 31.5 g/dL (ref 30.0–36.0)
MCV: 92 fL (ref 80.0–100.0)
Platelets: 172 K/uL (ref 150–400)
RBC: 3.11 MIL/uL — ABNORMAL LOW (ref 3.87–5.11)
RDW: 15 % (ref 11.5–15.5)
WBC: 11.7 K/uL — ABNORMAL HIGH (ref 4.0–10.5)
nRBC: 0 % (ref 0.0–0.2)

## 2023-09-19 MED ORDER — METHOCARBAMOL 500 MG PO TABS
500.0000 mg | ORAL_TABLET | Freq: Four times a day (QID) | ORAL | 2 refills | Status: DC | PRN
  Filled 2023-09-19: qty 40, 10d supply, fill #0

## 2023-09-19 MED ORDER — ASPIRIN 81 MG PO CHEW
81.0000 mg | CHEWABLE_TABLET | Freq: Two times a day (BID) | ORAL | 0 refills | Status: AC
  Filled 2023-09-19: qty 56, 28d supply, fill #0

## 2023-09-19 MED ORDER — CELECOXIB 200 MG PO CAPS
200.0000 mg | ORAL_CAPSULE | Freq: Two times a day (BID) | ORAL | 0 refills | Status: AC
  Filled 2023-09-19: qty 60, 30d supply, fill #0

## 2023-09-19 MED ORDER — POLYETHYLENE GLYCOL 3350 17 GM/SCOOP PO POWD
17.0000 g | Freq: Two times a day (BID) | ORAL | 0 refills | Status: DC
  Filled 2023-09-19: qty 238, 7d supply, fill #0

## 2023-09-19 MED ORDER — SENNA 8.6 MG PO TABS
2.0000 | ORAL_TABLET | Freq: Every day | ORAL | 0 refills | Status: AC
  Filled 2023-09-19: qty 28, 14d supply, fill #0

## 2023-09-19 MED ORDER — HYDROMORPHONE HCL 2 MG PO TABS
2.0000 mg | ORAL_TABLET | ORAL | 0 refills | Status: AC | PRN
  Filled 2023-09-19: qty 42, 7d supply, fill #0

## 2023-09-19 NOTE — Care Management Obs Status (Signed)
 MEDICARE OBSERVATION STATUS NOTIFICATION   Patient Details  Name: [name] MRN: [id number] Date of Birth: [birth year]   Medicare Observation Status Notification Given:  Yes    Sonda Manuella Quill, RN 09/19/2023, 9:43 AM

## 2023-09-19 NOTE — Progress Notes (Signed)
 Physical Therapy Treatment Patient Details Name: [name] MRN: [id number] DOB: [birth year] Today's Date: 09/19/2023   History of Present Illness Pt s/p R THR and with hx of bil TSR, bil TKR, and L THR    PT Comments  Pt continues very motivated and progressing well with mobility.  Pt reports full understanding of written HEP, up to ambulate in hall, negotiated stairs in multiple configurations, reviewed car transfers and reviewed dressing techniques.  Pt eager for dc home this date.    If plan is discharge home, recommend the following: A little help with walking and/or transfers;A little help with bathing/dressing/bathroom;Assistance with cooking/housework;Assist for transportation;Help with stairs or ramp for entrance   Can travel by private vehicle        Equipment Recommendations  Rolling walker (2 wheels)    Recommendations for Other Services       Precautions / Restrictions Precautions Precautions: Fall Recall of Precautions/Restrictions: Intact Restrictions Weight Bearing Restrictions Per Provider Order: No RLE Weight Bearing Per Provider Order: Weight bearing as tolerated Other Position/Activity Restrictions: WBAT     Mobility  Bed Mobility               General bed mobility comments: Pt up in chair and reports min difficulty exiting bed this am.    Transfers Overall transfer level: Needs assistance Equipment used: Rolling walker (2 wheels) Transfers: Sit to/from Stand Sit to Stand: Supervision           General transfer comment: min cues for LE management and use of UEs to self assist    Ambulation/Gait Ambulation/Gait assistance: Contact guard assist, Supervision Gait Distance (Feet): 100 Feet Assistive device: Rolling walker (2 wheels) Gait Pattern/deviations: Step-to pattern, Step-through pattern, Decreased step length - right, Decreased step length - left, Shuffle, Trunk flexed       General Gait Details: min cues for sequence, posture  and position from RW   Stairs Stairs: Yes Stairs assistance: Contact guard assist Stair Management: No rails, One rail Right, Two rails, Step to pattern, Backwards, With walker, With cane, Forwards Number of Stairs: 17 General stair comments: single step twice bkwd for stool into high bed.  5 stairs with bil rails and 10 stairs with rail and cane.  Min cues for sequence   Wheelchair Mobility     Tilt Bed    Modified Rankin (Stroke Patients Only)       Balance Overall balance assessment: Needs assistance Sitting-balance support: No upper extremity supported, Feet supported Sitting balance-Leahy Scale: Good     Standing balance support: No upper extremity supported Standing balance-Leahy Scale: Fair                              Hotel manager: No apparent difficulties  Cognition Arousal: Alert Behavior During Therapy: WFL for tasks assessed/performed   PT - Cognitive impairments: No apparent impairments                         Following commands: Intact      Cueing Cueing Techniques: Verbal cues  Exercises Total Joint Exercises Ankle Circles/Pumps: AROM, Both, 15 reps, Supine Quad Sets: AROM, Both, 10 reps, Supine Heel Slides: AAROM, Right, 20 reps, Supine Hip ABduction/ADduction: AAROM, Right, 15 reps, Supine Long Arc Quad: AROM, Right, 10 reps, Seated    General Comments        Pertinent Vitals/Pain Pain Assessment Pain Assessment: 0-10 Pain  Score: 4  Pain Location: R hip Pain Descriptors / Indicators: Aching, Sore Pain Intervention(s): Limited activity within patient's tolerance, Monitored during session, Premedicated before session, Ice applied    Home Living Family/patient expects to be discharged to:: Private residence Living Arrangements: Spouse/significant other                      Prior Function            PT Goals (current goals can now be found in the care plan section) Acute  Rehab PT Goals Patient Stated Goal: Regain IND PT Goal Formulation: With patient Time For Goal Achievement: 09/25/23 Potential to Achieve Goals: Good Progress towards PT goals: Progressing toward goals    Frequency    7X/week      PT Plan      Co-evaluation              AM-PAC PT 6 Clicks Mobility   Outcome Measure  Help needed turning from your back to your side while in a flat bed without using bedrails?: A Little Help needed moving from lying on your back to sitting on the side of a flat bed without using bedrails?: A Little Help needed moving to and from a bed to a chair (including a wheelchair)?: A Little Help needed standing up from a chair using your arms (e.g., wheelchair or bedside chair)?: A Little Help needed to walk in hospital room?: A Little Help needed climbing 3-5 steps with a railing? : A Little 6 Click Score: 18    End of Session Equipment Utilized During Treatment: Gait belt Activity Tolerance: Patient tolerated treatment well Patient left: in chair;with call bell/phone within reach;with chair alarm set Nurse Communication: Mobility status PT Visit Diagnosis: Difficulty in walking, not elsewhere classified (R26.2)     Time: 8997-8968 PT Time Calculation (min) (ACUTE ONLY): 29 min  Charges:    $Gait Training: 8-22 mins $Therapeutic Exercise: 8-22 mins $Therapeutic Activity: 8-22 mins PT General Charges $$ ACUTE PT VISIT: 1 Visit                     [facility] PT Acute Rehabilitation Services Office [phone]    Cynara Tatham 09/19/2023, 12:17 PM

## 2023-09-19 NOTE — Progress Notes (Signed)
 Physical Therapy Treatment Patient Details Name: [name] MRN: [id number] DOB: Dec 13, 1955 Today's Date: 09/19/2023   History of Present Illness Pt s/p R THR and with hx of bil TSR, bil TKR, and L THR    PT Comments  Pt very motivated and progressing well with mobility.  Pt up to ambulate increased distance in hall and performed HEP with written program provided to review.  Will follow up with stair training prior to dc home this date.    If plan is discharge home, recommend the following: A little help with walking and/or transfers;A little help with bathing/dressing/bathroom;Assistance with cooking/housework;Assist for transportation;Help with stairs or ramp for entrance   Can travel by private vehicle        Equipment Recommendations  Rolling walker (2 wheels)    Recommendations for Other Services       Precautions / Restrictions Precautions Precautions: Fall Recall of Precautions/Restrictions: Intact Restrictions Weight Bearing Restrictions Per Provider Order: No RLE Weight Bearing Per Provider Order: Weight bearing as tolerated Other Position/Activity Restrictions: WBAT     Mobility  Bed Mobility               General bed mobility comments: Pt up in chair and reports min difficulty exiting bed this am.    Transfers Overall transfer level: Needs assistance Equipment used: Rolling walker (2 wheels) Transfers: Sit to/from Stand Sit to Stand: Contact guard assist, Supervision           General transfer comment: min cues for LE management and use of UEs to self assist    Ambulation/Gait Ambulation/Gait assistance: Contact guard assist, Supervision Gait Distance (Feet): 240 Feet Assistive device: Rolling walker (2 wheels) Gait Pattern/deviations: Step-to pattern, Step-through pattern, Decreased step length - right, Decreased step length - left, Shuffle, Trunk flexed       General Gait Details: min cues for sequence, posture and position from  [employer]             Wheelchair Mobility     Tilt Bed    Modified Rankin (Stroke Patients Only)       Balance Overall balance assessment: Needs assistance Sitting-balance support: No upper extremity supported, Feet supported Sitting balance-Leahy Scale: Good     Standing balance support: No upper extremity supported Standing balance-Leahy Scale: Fair                              Hotel manager: No apparent difficulties  Cognition Arousal: Alert Behavior During Therapy: WFL for tasks assessed/performed   PT - Cognitive impairments: No apparent impairments                         Following commands: Intact      Cueing Cueing Techniques: Verbal cues  Exercises Total Joint Exercises Ankle Circles/Pumps: AROM, Both, 15 reps, Supine Quad Sets: AROM, Both, 10 reps, Supine Heel Slides: AAROM, Right, 20 reps, Supine Hip ABduction/ADduction: AAROM, Right, 15 reps, Supine Long Arc Quad: AROM, Right, 10 reps, Seated    General Comments        Pertinent Vitals/Pain Pain Assessment Pain Assessment: 0-10 Pain Score: 4  Pain Location: R hip Pain Descriptors / Indicators: Aching, Sore Pain Intervention(s): Limited activity within patient's tolerance, Monitored during session, Premedicated before session, Ice applied    Home Living Family/patient expects to be discharged to:: Private residence Living Arrangements: Spouse/significant other  Prior Function            PT Goals (current goals can now be found in the care plan section) Acute Rehab PT Goals Patient Stated Goal: Regain IND PT Goal Formulation: With patient Time For Goal Achievement: 09/25/23 Potential to Achieve Goals: Good Progress towards PT goals: Progressing toward goals    Frequency    7X/week      PT Plan      Co-evaluation              AM-PAC PT 6 Clicks Mobility   Outcome Measure  Help  needed turning from your back to your side while in a flat bed without using bedrails?: A Little Help needed moving from lying on your back to sitting on the side of a flat bed without using bedrails?: A Little Help needed moving to and from a bed to a chair (including a wheelchair)?: A Little Help needed standing up from a chair using your arms (e.g., wheelchair or bedside chair)?: A Little Help needed to walk in hospital room?: A Little Help needed climbing 3-5 steps with a railing? : A Lot 6 Click Score: 17    End of Session Equipment Utilized During Treatment: Gait belt Activity Tolerance: Patient tolerated treatment well Patient left: in chair;with call bell/phone within reach;with chair alarm set Nurse Communication: Mobility status PT Visit Diagnosis: Difficulty in walking, not elsewhere classified (R26.2)     Time: 9095-9068 PT Time Calculation (min) (ACUTE ONLY): 27 min  Charges:    $Gait Training: 8-22 mins $Therapeutic Exercise: 8-22 mins PT General Charges $$ ACUTE PT VISIT: 1 Visit                     [facility] PT Acute Rehabilitation Services Office [phone]    [name] 09/19/2023, 12:08 PM

## 2023-09-19 NOTE — Progress Notes (Signed)
   Subjective: 1 Day Post-Op Procedure(s) (LRB): ARTHROPLASTY, HIP, TOTAL, ANTERIOR APPROACH (Right) Patient reports pain as mild.   Patient seen in rounds with Dr. Ernie. Patient is resting in bed on exam this morning. No acute events overnight. Foley catheter removed. Patient ambulated 90 feet with PT yesterday. We will start therapy today.   Objective: Vital signs in last 24 hours: Temp:  [97.3 F (36.3 C)-98 F (36.7 C)] 97.9 F (36.6 C) (07/18 0535) Pulse Rate:  [51-71] 60 (07/18 0535) Resp:  [8-17] 16 (07/18 0535) BP: (105-134)/(51-65) 134/65 (07/18 0535) SpO2:  [94 %-100 %] 100 % (07/18 0535)  Intake/Output from previous day:  Intake/Output Summary (Last 24 hours) at 09/19/2023 0732 Last data filed at 09/19/2023 0600 Gross per 24 hour  Intake 3545.32 ml  Output 2450 ml  Net 1095.32 ml     Intake/Output this shift: No intake/output data recorded.  Labs: Recent Labs    09/19/23 0319  HGB 9.0*   Recent Labs    09/19/23 0319  WBC 11.7*  RBC 3.11*  HCT 28.6*  PLT 172   Recent Labs    09/19/23 0319  NA 139  K 4.4  CL 105  CO2 25  BUN 22  CREATININE 0.35*  GLUCOSE 129*  CALCIUM  8.1*   No results for input(s): LABPT, INR in the last 72 hours.  Exam: General - Patient is Alert and Oriented Extremity - Neurologically intact Sensation intact distally Intact pulses distally Dorsiflexion/Plantar flexion intact Dressing - dressing C/D/I Motor Function - intact, moving foot and toes well on exam.   Past Medical History:  Diagnosis Date   Allergy    Anemia    Anxiety    Arthritis    Asthma    Cancer (HCC) 2023   Thyroid    Depression    Dyspnea    GERD (gastroesophageal reflux disease)    Goiter    History of hiatal hernia    Hypertension    Hypothyroidism    Pain in joint of left shoulder 12/11/2021    Assessment/Plan: 1 Day Post-Op Procedure(s) (LRB): ARTHROPLASTY, HIP, TOTAL, ANTERIOR APPROACH (Right) Principal Problem:   S/P  total right hip arthroplasty  Estimated body mass index is 34.28 kg/m as calculated from the following:   Height as of this encounter: 5' 5 (1.651 m).   Weight as of this encounter: 93.4 kg. Advance diet Up with therapy D/C IV fluids  DVT Prophylaxis - Aspirin  Weight bearing as tolerated.  Hgb stable at 9 this AM, down from 13 We discussed PO iron, but she notes constipation from this previously and wishes to avoid  Will have [facility] deliver meds as dilaudid  is not available at many places  Plan is to go Home after hospital stay. Plan for discharge today after meeting goals with therapy. Follow up in the office in 2 weeks.   Rosina Calin, PA-C Orthopedic Surgery [phone] 09/19/2023, 7:32 AM

## 2023-09-19 NOTE — TOC Initial Note (Signed)
 Transition of Care [facility]) - Initial/Assessment Note    Patient Details  Name: [name] MRN: [id number] Date of Birth: 06/26/1955  [facility] CM/SW Contact:    Sonda Manuella Quill, RN Phone Number: 09/19/2023, 10:10 AM  Clinical Narrative:                 Spoke w/ pt in room; pt says she lives at home w/ her spouse Desiderio Bailiff [phone]); she plans to return at d/c; her husband will provide transportation; pt verified insurance/PCP; she denied SDOH risks; pt has BSC; she does not have HH services, or home oxygen; no TOC needs; TOC is signing off; please place consult if needed.  Expected Discharge Plan: Home/Self Care Barriers to Discharge: No Barriers Identified   Patient Goals and CMS Choice Patient states their goals for this hospitalization and ongoing recovery are:: home CMS Medicare.gov Compare Post Acute Care list provided to:: Patient        Expected Discharge Plan and Services   Discharge Planning Services: CM Consult   Living arrangements for the past 2 months: Single Family Home Expected Discharge Date: 09/19/23                                    Prior Living Arrangements/Services Living arrangements for the past 2 months: Single Family Home Lives with:: Self Patient language and need for interpreter reviewed:: Yes Do you feel safe going back to the place where you live?: Yes      Need for Family Participation in Patient Care: Yes (Comment) Care giver support system in place?: Yes (comment) Current home services: DME (BSC) Criminal Activity/Legal Involvement Pertinent to Current Situation/Hospitalization: No - Comment as needed  Activities of Daily Living   ADL Screening (condition at time of admission) Independently performs ADLs?: Yes (appropriate for developmental age) Is the patient deaf or have difficulty hearing?: No Does the patient have difficulty seeing, even when wearing glasses/contacts?: No Does the patient have  difficulty concentrating, remembering, or making decisions?: No  Permission Sought/Granted Permission sought to share information with : Case Manager Permission granted to share information with : Yes, Verbal Permission Granted  Share Information with NAME: Case Manager     Permission granted to share info w Relationship: Desiderio Bailiff (spouse) [phone]     Emotional Assessment Appearance:: Appears stated age Attitude/Demeanor/Rapport: Gracious Affect (typically observed): Accepting Orientation: : Oriented to Self, Oriented to Place, Oriented to  Time, Oriented to Situation Alcohol / Substance Use: Not Applicable Psych Involvement: No (comment)  Admission diagnosis:  Primary osteoarthritis of right hip [M16.11] S/P total right hip arthroplasty [Z96.641] Patient Active Problem List   Diagnosis Date Noted   S/P total right hip arthroplasty 09/18/2023   Pre-op evaluation 09/08/2023   Primary hypertension 04/07/2022   H/O total shoulder replacement, left 02/08/2022   Pain in joint of right hip 01/17/2022   Asthma due to environmental allergies 01/15/2022   Other mixed anxiety disorders 11/29/2021   Iron deficiency anemia secondary to inadequate dietary iron intake 01/23/2021   Other fatigue 01/23/2021   Annual physical exam 01/23/2021   S/P total knee arthroplasty, right 11/28/2020   Hammer toe 10/14/2017   PCP:  Oris Camie BRAVO, NP Pharmacy:   [facility] [address] [place] [address] Phone: [phone] Fax: [phone]  [facility] [id number] [place], [place] - [address] [id number]  [place] [address] Phone: [phone] Fax: [phone]     Social Drivers of Health (SDOH) Social History: SDOH Screenings   Food Insecurity: No Food Insecurity (09/19/2023)  Housing: Low Risk  (09/19/2023)  Transportation Needs: No Transportation Needs (09/19/2023)  Utilities: Not At Risk (09/19/2023)  Alcohol  Screen: Low Risk  (09/07/2023)  Depression (PHQ2-9): Low Risk  (02/13/2023)  Financial Resource Strain: Low Risk  (09/07/2023)  Physical Activity: Inactive (09/07/2023)  Social Connections: Socially Integrated (09/18/2023)  Stress: Stress Concern Present (09/07/2023)  Tobacco Use: Medium Risk (09/18/2023)  Health Literacy: Adequate Health Literacy (01/28/2023)   SDOH Interventions: Food Insecurity Interventions: Intervention Not Indicated, Inpatient TOC Housing Interventions: Intervention Not Indicated, Inpatient TOC Transportation Interventions: Intervention Not Indicated, Inpatient TOC Utilities Interventions: Intervention Not Indicated, Inpatient TOC   Readmission Risk Interventions     No data to display

## 2023-09-19 NOTE — Plan of Care (Signed)
 Patient discharged home with family via private vehicle following education by discharge nurse. Jon LULLA Reins, RN 09/19/23 11:48 AM

## 2023-09-19 NOTE — Plan of Care (Signed)
  Problem: Clinical Measurements: Goal: Ability to maintain clinical measurements within normal limits will improve Outcome: Progressing   Problem: Activity: Goal: Risk for activity intolerance will decrease Outcome: Progressing   Problem: Pain Managment: Goal: General experience of comfort will improve and/or be controlled Outcome: Progressing   Problem: Skin Integrity: Goal: Risk for impaired skin integrity will decrease Outcome: Progressing   Problem: Pain Management: Goal: Pain level will decrease with appropriate interventions Outcome: Progressing

## 2023-09-19 NOTE — Progress Notes (Signed)
 AVS reviewed w/ pt who verbalized an understanding. PIV removed as noted. Pt dressed for d/c to home. TOC meds in a secure bag delivered to pt tin room by this RN. Ride enroute

## 2023-09-20 ENCOUNTER — Other Ambulatory Visit (HOSPITAL_COMMUNITY): Payer: Self-pay

## 2023-09-24 ENCOUNTER — Other Ambulatory Visit

## 2023-09-30 ENCOUNTER — Other Ambulatory Visit

## 2023-09-30 DIAGNOSIS — E890 Postprocedural hypothyroidism: Secondary | ICD-10-CM | POA: Diagnosis not present

## 2023-09-30 LAB — TSH: TSH: 3.88 m[IU]/L (ref 0.40–4.50)

## 2023-09-30 NOTE — Discharge Summary (Signed)
 Patient ID: [name] MRN: [id number] DOB/AGE: [birth year] 68 y.o.  Admit date: 09/18/2023 Discharge date: 09/19/2023  Admission Diagnoses:  Right hip osteoarthritis  Discharge Diagnoses:  Principal Problem:   S/P total right hip arthroplasty   Past Medical History:  Diagnosis Date   Allergy    Anemia    Anxiety    Arthritis    Asthma    Cancer (HCC) 2023   Thyroid    Depression    Dyspnea    GERD (gastroesophageal reflux disease)    Goiter    History of hiatal hernia    Hypertension    Hypothyroidism    Pain in joint of left shoulder 12/11/2021    Surgeries: Procedure(s): ARTHROPLASTY, HIP, TOTAL, ANTERIOR APPROACH on 09/18/2023   Consultants:   Discharged Condition: Improved  Hospital Course: [name] is an 68 y.o. female who was admitted 09/18/2023 for operative treatment ofS/P total right hip arthroplasty. Patient has severe unremitting pain that affects sleep, daily activities, and work/hobbies. After pre-op clearance the patient was taken to the operating room on 09/18/2023 and underwent  Procedure(s): ARTHROPLASTY, HIP, TOTAL, ANTERIOR APPROACH.    Patient was given perioperative antibiotics:  Anti-infectives (From admission, onward)    Start     Dose/Rate Route Frequency Ordered Stop   09/18/23 1400  ceFAZolin  (ANCEF ) IVPB 2g/100 mL premix        2 g 200 mL/hr over 30 Minutes Intravenous Every 6 hours 09/18/23 1228 09/19/23 0726   09/18/23 0645  ceFAZolin  (ANCEF ) IVPB 2g/100 mL premix        2 g 200 mL/hr over 30 Minutes Intravenous On call to O.R. 09/18/23 0630 09/18/23 0841        Patient was given sequential compression devices, early ambulation, and chemoprophylaxis to prevent DVT. Patient worked with PT and was meeting their goals regarding safe ambulation and transfers.  Patient benefited maximally from hospital stay and there were no complications.    Recent vital signs: No data found.   Recent laboratory studies: No results for  input(s): WBC, HGB, HCT, PLT, NA, K, CL, CO2, BUN, CREATININE, GLUCOSE, INR, CALCIUM  in the last 72 hours.  Invalid input(s): PT, 2   Discharge Medications:   Allergies as of 09/19/2023       Reactions   Mango Flavor [flavoring Agent (non-screening)] Anaphylaxis   Specifically mango peel , patient can eat the fruit itself   Oxycontin [oxycodone] Other (See Comments)   Dizziness        Medication List     STOP taking these medications    diclofenac 75 MG EC tablet Commonly known as: VOLTAREN   magnesium  oxide 400 MG tablet Commonly known as: MAG-OX       TAKE these medications    acetaminophen  650 MG CR tablet Commonly known as: TYLENOL  Take 650 mg by mouth every 8 (eight) hours as needed for pain.   albuterol  108 (90 Base) MCG/ACT inhaler Commonly known as: VENTOLIN  HFA Inhale 2 puffs into the lungs every 6 (six) hours as needed for wheezing or shortness of breath.   ALPRAZolam  0.25 MG tablet Commonly known as: XANAX  TAKE 1/2 TO 1 TABLET BY MOUTH UP TO 2 TIMES A DAY AS NEEDED FOR PANIC / ANXIETY What changed:  how much to take how to take this when to take this reasons to take this additional instructions   Aspirin  Low Dose 81 MG chewable tablet Generic drug: aspirin  Chew 1 tablet (81 mg total) by mouth 2 (two) times  daily for 28 days.   b complex vitamins capsule Take 1 capsule by mouth daily.   Breztri  Aerosphere 160-9-4.8 MCG/ACT Aero inhaler Generic drug: budesonide -glycopyrrolate -formoterol  Two puffs twice a day for maintenance. What changed:  how much to take how to take this when to take this additional instructions   busPIRone  7.5 MG tablet Commonly known as: BUSPAR  Take 1 tablet (7.5 mg total) by mouth 2 (two) times daily.   celecoxib  200 MG capsule Commonly known as: CeleBREX  Take 1 capsule (200 mg total) by mouth 2 (two) times daily.   cetirizine  10 MG tablet Commonly known as: ZYRTEC  Take 1 tablet  (10 mg total) by mouth 2 (two) times daily. What changed: when to take this   fluticasone  50 MCG/ACT nasal spray Commonly known as: FLONASE  Place 1 spray into both nostrils daily.   hydrochlorothiazide  25 MG tablet Commonly known as: HYDRODIURIL  Take 1 tablet (25 mg total) by mouth every morning.   HYDROmorphone  2 MG tablet Commonly known as: DILAUDID  Take 1 tablet (2 mg total) by mouth every 4 (four) hours as needed for severe pain (pain score 7-10).   levothyroxine  150 MCG tablet Commonly known as: SYNTHROID  Take 1 tablet (150 mcg total) by mouth daily.   methocarbamol  500 MG tablet Commonly known as: ROBAXIN  Take 1 tablet (500 mg total) by mouth every 6 (six) hours as needed for muscle spasms.   polyethylene glycol powder 17 GM/SCOOP powder Commonly known as: GLYCOLAX /MIRALAX  Take 17 g dissolved in liquid by mouth 2 (two) times daily.   senna 8.6 MG Tabs tablet Commonly known as: SENOKOT Take 2 tablets (17.2 mg total) by mouth at bedtime for 14 days.   sertraline  100 MG tablet Commonly known as: ZOLOFT  TAKE 1.5 TABLETS BY MOUTH EVERY MORNING What changed:  how much to take how to take this when to take this additional instructions   vitamin C  1000 MG tablet Take 1,000 mg by mouth daily.   Vitamin D  125 MCG (5000 UT) Caps Take 5,000 Units by mouth daily.               Discharge Care Instructions  (From admission, onward)           Start     Ordered   09/19/23 0000  Change dressing       Comments: Maintain surgical dressing until follow up in the clinic. If the edges start to pull up, may reinforce with tape. If the dressing is no longer working, may remove and cover with gauze and tape, but must keep the area dry and clean.  Call with any questions or concerns.   09/19/23 0735            Diagnostic Studies: DG Pelvis Portable Result Date: 09/18/2023 CLINICAL DATA:  Status post hip arthroplasty. EXAM: PORTABLE PELVIS 1-2 VIEWS COMPARISON:   None Available. FINDINGS: Right hip arthroplasty in expected alignment. No periprosthetic lucency or fracture. Recent postsurgical change includes air and edema in the soft tissues. Previous left hip arthroplasty. IMPRESSION: Right hip arthroplasty without immediate postoperative complication. Electronically Signed   By: Andrea Gasman M.D.   On: 09/18/2023 11:10   DG HIP UNILAT WITH PELVIS 1V RIGHT Result Date: 09/18/2023 CLINICAL DATA:  Elective surgery. EXAM: DG HIP (WITH OR WITHOUT PELVIS) 1V RIGHT COMPARISON:  None Available. FINDINGS: Two fluoroscopic spot views of the pelvis and right hip obtained in the operating room. Images during hip arthroplasty. Fluoroscopy time 11 seconds. Dose 1.9 mGy. Previous left hip arthroplasty is  partially visualized. IMPRESSION: Intraoperative fluoroscopy during right hip arthroplasty. Electronically Signed   By: Andrea Gasman M.D.   On: 09/18/2023 11:09   DG C-Arm 1-60 Min-No Report Result Date: 09/18/2023 Fluoroscopy was utilized by the requesting physician.  No radiographic interpretation.    Disposition: Discharge disposition: 01-Home or Self Care       Discharge Instructions     Call MD / Call 911   Complete by: As directed    If you experience chest pain or shortness of breath, CALL 911 and be transported to the hospital emergency room.  If you develope a fever above 101 F, pus (white drainage) or increased drainage or redness at the wound, or calf pain, call your surgeon's office.   Change dressing   Complete by: As directed    Maintain surgical dressing until follow up in the clinic. If the edges start to pull up, may reinforce with tape. If the dressing is no longer working, may remove and cover with gauze and tape, but must keep the area dry and clean.  Call with any questions or concerns.   Constipation Prevention   Complete by: As directed    Drink plenty of fluids.  Prune juice may be helpful.  You may use a stool softener, such as  Colace (over the counter) 100 mg twice a day.  Use MiraLax  (over the counter) for constipation as needed.   Diet - low sodium heart healthy   Complete by: As directed    Increase activity slowly as tolerated   Complete by: As directed    Weight bearing as tolerated with assist device (walker, cane, etc) as directed, use it as long as suggested by your surgeon or therapist, typically at least 4-6 weeks.   Post-operative opioid taper instructions:   Complete by: As directed    POST-OPERATIVE OPIOID TAPER INSTRUCTIONS: It is important to wean off of your opioid medication as soon as possible. If you do not need pain medication after your surgery it is ok to stop day one. Opioids include: Codeine, Hydrocodone (Norco, Vicodin), Oxycodone(Percocet, oxycontin) and hydromorphone  amongst others.  Long term and even short term use of opiods can cause: Increased pain response Dependence Constipation Depression Respiratory depression And more.  Withdrawal symptoms can include Flu like symptoms Nausea, vomiting And more Techniques to manage these symptoms Hydrate well Eat regular healthy meals Stay active Use relaxation techniques(deep breathing, meditating, yoga) Do Not substitute Alcohol to help with tapering If you have been on opioids for less than two weeks and do not have pain than it is ok to stop all together.  Plan to wean off of opioids This plan should start within one week post op of your joint replacement. Maintain the same interval or time between taking each dose and first decrease the dose.  Cut the total daily intake of opioids by one tablet each day Next start to increase the time between doses. The last dose that should be eliminated is the evening dose.      TED hose   Complete by: As directed    Use stockings (TED hose) for 2 weeks on both leg(s).  You may remove them at night for sleeping.        Follow-up Information     Ernie Cough, MD. Schedule an  appointment as soon as possible for a visit in 2 week(s).   Specialty: Orthopedic Surgery Contact information: [address] [place] [address] [phone]  Signed: Rosina JONELLE Calin 09/30/2023, 1:21 PM

## 2023-10-18 ENCOUNTER — Other Ambulatory Visit (HOSPITAL_COMMUNITY): Payer: Self-pay

## 2023-10-29 DIAGNOSIS — Z471 Aftercare following joint replacement surgery: Secondary | ICD-10-CM | POA: Diagnosis not present

## 2023-10-29 DIAGNOSIS — Z96641 Presence of right artificial hip joint: Secondary | ICD-10-CM | POA: Diagnosis not present

## 2023-10-29 DIAGNOSIS — Z96642 Presence of left artificial hip joint: Secondary | ICD-10-CM | POA: Diagnosis not present

## 2023-11-26 ENCOUNTER — Telehealth: Admitting: Family Medicine

## 2023-11-26 DIAGNOSIS — J029 Acute pharyngitis, unspecified: Secondary | ICD-10-CM | POA: Diagnosis not present

## 2023-11-26 MED ORDER — LIDOCAINE VISCOUS HCL 2 % MT SOLN: 15.0000 mL | OROMUCOSAL | 0 refills | Status: DC | PRN

## 2023-11-26 MED ORDER — BENZONATATE 100 MG PO CAPS: 100.0000 mg | ORAL_CAPSULE | Freq: Three times a day (TID) | ORAL | 0 refills | Status: DC | PRN

## 2023-11-26 MED ORDER — IPRATROPIUM BROMIDE 0.03 % NA SOLN: 2.0000 | Freq: Two times a day (BID) | NASAL | 0 refills | Status: DC

## 2023-11-26 MED ORDER — PROMETHAZINE-DM 6.25-15 MG/5ML PO SYRP: 2.5000 mL | ORAL_SOLUTION | Freq: Four times a day (QID) | ORAL | 0 refills | Status: DC | PRN

## 2023-11-26 NOTE — Patient Instructions (Signed)
 Darice LITTIE Christmas, thank you for joining Chiquita [name], NP for today's virtual visit.  While this provider is not your primary care provider (PCP), if your PCP is located in our provider database this encounter information will be shared with them immediately following your visit.   A [facility] MyChart account gives you access to today's visit and all your visits, tests, and labs performed at [facility]  click here if you don't have a [facility] MyChart account or go to [email or web address]  Consent: (Patient) [name] provided verbal consent for this virtual visit at the beginning of the encounter.  Current Medications:  Current Outpatient Medications:    benzonatate  (TESSALON ) 100 MG capsule, Take 1 capsule (100 mg total) by mouth 3 (three) times daily as needed for cough., Disp: 30 capsule, Rfl: 0   ipratropium (ATROVENT ) 0.03 % nasal spray, Place 2 sprays into both nostrils every 12 (twelve) hours., Disp: 30 mL, Rfl: 0   lidocaine  (XYLOCAINE ) 2 % solution, Use as directed 15 mLs in the mouth or throat as needed for mouth pain., Disp: 100 mL, Rfl: 0   promethazine -dextromethorphan (PROMETHAZINE -DM) 6.25-15 MG/5ML syrup, Take 2.5 mLs by mouth 4 (four) times daily as needed for cough., Disp: 118 mL, Rfl: 0   acetaminophen  (TYLENOL ) 650 MG CR tablet, Take 650 mg by mouth every 8 (eight) hours as needed for pain., Disp: , Rfl:    albuterol  (VENTOLIN  HFA) 108 (90 Base) MCG/ACT inhaler, Inhale 2 puffs into the lungs every 6 (six) hours as needed for wheezing or shortness of breath., Disp: 1 each, Rfl: 3   ALPRAZolam  (XANAX ) 0.25 MG tablet, TAKE 1/2 TO 1 TABLET BY MOUTH UP TO 2 TIMES A DAY AS NEEDED FOR PANIC / ANXIETY (Patient taking differently: Take 0.25 mg by mouth 2 (two) times daily as needed for anxiety or sleep.), Disp: 20 tablet, Rfl: 2   Ascorbic Acid  (VITAMIN C ) 1000 MG tablet, Take 1,000 mg by mouth daily., Disp: , Rfl:    b complex vitamins capsule, Take 1  capsule by mouth daily., Disp: , Rfl:    Budeson-Glycopyrrol-Formoterol  (BREZTRI  AEROSPHERE) 160-9-4.8 MCG/ACT AERO, Two puffs twice a day for maintenance. (Patient taking differently: Inhale 2 puffs into the lungs in the morning and at bedtime.), Disp: 10.7 g, Rfl: 11   busPIRone  (BUSPAR ) 7.5 MG tablet, Take 1 tablet (7.5 mg total) by mouth 2 (two) times daily., Disp: 180 tablet, Rfl: 0   cetirizine  (ZYRTEC ) 10 MG tablet, Take 1 tablet (10 mg total) by mouth 2 (two) times daily. (Patient taking differently: Take 10 mg by mouth daily.), Disp: 180 tablet, Rfl: 3   Cholecalciferol  (VITAMIN D ) 125 MCG (5000 UT) CAPS, Take 5,000 Units by mouth daily., Disp: , Rfl:    fluticasone  (FLONASE ) 50 MCG/ACT nasal spray, Place 1 spray into both nostrils daily., Disp: 16 g, Rfl: 6   hydrochlorothiazide  (HYDRODIURIL ) 25 MG tablet, Take 1 tablet (25 mg total) by mouth every morning., Disp: 90 tablet, Rfl: 3   HYDROmorphone  (DILAUDID ) 2 MG tablet, Take 1 tablet (2 mg total) by mouth every 4 (four) hours as needed for severe pain (pain score 7-10)., Disp: 42 tablet, Rfl: 0   levothyroxine  (SYNTHROID ) 150 MCG tablet, Take 1 tablet (150 mcg total) by mouth daily., Disp: 90 tablet, Rfl: 3   methocarbamol  (ROBAXIN ) 500 MG tablet, Take 1 tablet (500 mg total) by mouth every 6 (six) hours as needed for muscle spasms., Disp: 40 tablet, Rfl: 2   polyethylene glycol  powder (GLYCOLAX /MIRALAX ) 17 GM/SCOOP powder, Take 17 g dissolved in liquid by mouth 2 (two) times daily., Disp: 238 g, Rfl: 0   sertraline  (ZOLOFT ) 100 MG tablet, TAKE 1.5 TABLETS BY MOUTH EVERY MORNING (Patient taking differently: Take 150 mg by mouth daily.), Disp: 135 tablet, Rfl: 3   Medications ordered in this encounter:  Meds ordered this encounter  Medications   lidocaine  (XYLOCAINE ) 2 % solution    Sig: Use as directed 15 mLs in the mouth or throat as needed for mouth pain.    Dispense:  100 mL    Refill:  0    Supervising Provider:   LAMPTEY, PHILIP O  [[id number]]   ipratropium (ATROVENT ) 0.03 % nasal spray    Sig: Place 2 sprays into both nostrils every 12 (twelve) hours.    Dispense:  30 mL    Refill:  0    Supervising Provider:   LAMPTEY, PHILIP O [[id number]]   benzonatate  (TESSALON ) 100 MG capsule    Sig: Take 1 capsule (100 mg total) by mouth 3 (three) times daily as needed for cough.    Dispense:  30 capsule    Refill:  0    Supervising Provider:   LAMPTEY, PHILIP O [[id number]]   promethazine -dextromethorphan (PROMETHAZINE -DM) 6.25-15 MG/5ML syrup    Sig: Take 2.5 mLs by mouth 4 (four) times daily as needed for cough.    Dispense:  118 mL    Refill:  0    Supervising Provider:   BLAISE ALEENE KIDD [[id number]]     *If you need refills on other medications prior to your next appointment, please contact your pharmacy*  Follow-Up: Call back or seek an in-person evaluation if the symptoms worsen or if the condition fails to improve as anticipated.  [facility] [phone]  Other Instructions    -Suspect viral pharyngitis - Discussed OTC management - Will prescribe Viscous lidocaine  for symptomatic relief - May use tylenol  and ibuprofen  alternating every 3-4 hours as needed for pain, fevers, body aches - Salt water  gargles - Hydration and voice rest - Seek in person evaluation if worsening or fails to improve    If you have been instructed to have an in-person evaluation today at a local Urgent Care facility, please use the link below. It will take you to a list of all of our available [facility], including address, phone number and hours of operation. Please do not delay care.  [facility]  If you or a family member do not have a primary care provider, use the link below to schedule a visit and establish care. When you choose a [facility] primary care physician or advanced practice provider, you gain a long-term partner in health. Find a Primary Care Provider  Learn more about [facility]'s in-office and virtual care options: [place] - Get Care Now

## 2023-11-26 NOTE — Progress Notes (Signed)
 Virtual Visit Consent   [name], you are scheduled for a virtual visit with a [facility] provider today. Just as with appointments in the office, your consent must be obtained to participate. Your consent will be active for this visit and any virtual visit you may have with one of our providers in the next 365 days. If you have a MyChart account, a copy of this consent can be sent to you electronically.  As this is a virtual visit, video technology does not allow for your provider to perform a traditional examination. This may limit your provider's ability to fully assess your condition. If your provider identifies any concerns that need to be evaluated in person or the need to arrange testing (such as labs, EKG, etc.), we will make arrangements to do so. Although advances in technology are sophisticated, we cannot ensure that it will always work on either your end or our end. If the connection with a video visit is poor, the visit may have to be switched to a telephone visit. With either a video or telephone visit, we are not always able to ensure that we have a secure connection.  By engaging in this virtual visit, you consent to the provision of healthcare and authorize for your insurance to be billed (if applicable) for the services provided during this visit. Depending on your insurance coverage, you may receive a charge related to this service.  I need to obtain your verbal consent now. Are you willing to proceed with your visit today? [name] has provided verbal consent on 11/26/2023 for a virtual visit (video or telephone). Chiquita [name], NP  Date: 11/26/2023 11:16 AM   Virtual Visit via Video Note   I, Chiquita [name], connected with  [name]  ([id number], 1955-07-02) on 11/26/23 at 11:15 AM EDT by a video-enabled telemedicine application and verified that I am speaking with the correct person using two identifiers.  Location: Patient: Virtual Visit Location Patient:  Home Provider: Virtual Visit Location Provider: Home Office   I discussed the limitations of evaluation and management by telemedicine and the availability of in person appointments. The patient expressed understanding and agreed to proceed.    History of Present Illness: [name] is a 68 y.o. who identifies as a female who was assigned female at birth, and is being seen today for   On Sunday started getting a sore throat. Thought it was post nasal, allergies (uses zytrec twice daily). Took some ibuprofen  and it didn't help much, is getting much worse. Salt water  gargles not working.  Right nare is draining some, but not a lot.  Right side of throat is worse.   COVID/FLU test neg Reports it feels like strep throat she had when she was younger.  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   Pain in joint of right hip 01/17/2022   Asthma due to environmental allergies 01/15/2022   Other mixed anxiety disorders 11/29/2021   Iron deficiency anemia secondary to inadequate dietary iron intake 01/23/2021   Other fatigue 01/23/2021   Annual physical exam 01/23/2021   S/P total knee arthroplasty, right 11/28/2020   Hammer toe 10/14/2017    Allergies:  Allergies  Allergen Reactions   Mango Flavor [Flavoring Agent (Non-Screening)] Anaphylaxis    Specifically mango peel ,  patient can eat the fruit itself   Oxycontin [Oxycodone] Other (See Comments)    Dizziness   Medications:  Current Outpatient Medications:    acetaminophen  (TYLENOL ) 650 MG CR tablet, Take 650 mg by mouth every 8 (eight) hours as needed for pain., Disp: , Rfl:    albuterol  (VENTOLIN  HFA) 108 (90 Base) MCG/ACT inhaler, Inhale 2 puffs into the lungs every 6 (six) hours as needed for wheezing or shortness of breath., Disp: 1 each, Rfl: 3    ALPRAZolam  (XANAX ) 0.25 MG tablet, TAKE 1/2 TO 1 TABLET BY MOUTH UP TO 2 TIMES A DAY AS NEEDED FOR PANIC / ANXIETY (Patient taking differently: Take 0.25 mg by mouth 2 (two) times daily as needed for anxiety or sleep.), Disp: 20 tablet, Rfl: 2   Ascorbic Acid  (VITAMIN C ) 1000 MG tablet, Take 1,000 mg by mouth daily., Disp: , Rfl:    b complex vitamins capsule, Take 1 capsule by mouth daily., Disp: , Rfl:    Budeson-Glycopyrrol-Formoterol  (BREZTRI  AEROSPHERE) 160-9-4.8 MCG/ACT AERO, Two puffs twice a day for maintenance. (Patient taking differently: Inhale 2 puffs into the lungs in the morning and at bedtime.), Disp: 10.7 g, Rfl: 11   busPIRone  (BUSPAR ) 7.5 MG tablet, Take 1 tablet (7.5 mg total) by mouth 2 (two) times daily., Disp: 180 tablet, Rfl: 0   cetirizine  (ZYRTEC ) 10 MG tablet, Take 1 tablet (10 mg total) by mouth 2 (two) times daily. (Patient taking differently: Take 10 mg by mouth daily.), Disp: 180 tablet, Rfl: 3   Cholecalciferol  (VITAMIN D ) 125 MCG (5000 UT) CAPS, Take 5,000 Units by mouth daily., Disp: , Rfl:    fluticasone  (FLONASE ) 50 MCG/ACT nasal spray, Place 1 spray into both nostrils daily., Disp: 16 g, Rfl: 6   hydrochlorothiazide  (HYDRODIURIL ) 25 MG tablet, Take 1 tablet (25 mg total) by mouth every morning., Disp: 90 tablet, Rfl: 3   HYDROmorphone  (DILAUDID ) 2 MG tablet, Take 1 tablet (2 mg total) by mouth every 4 (four) hours as needed for severe pain (pain score 7-10)., Disp: 42 tablet, Rfl: 0   levothyroxine  (SYNTHROID ) 150 MCG tablet, Take 1 tablet (150 mcg total) by mouth daily., Disp: 90 tablet, Rfl: 3   methocarbamol  (ROBAXIN ) 500 MG tablet, Take 1 tablet (500 mg total) by mouth every 6 (six) hours as needed for muscle spasms., Disp: 40 tablet, Rfl: 2   polyethylene glycol powder (GLYCOLAX /MIRALAX ) 17 GM/SCOOP powder, Take 17 g dissolved in liquid by mouth 2 (two) times daily., Disp: 238 g, Rfl: 0   sertraline  (ZOLOFT ) 100 MG tablet, TAKE 1.5 TABLETS BY MOUTH EVERY MORNING  (Patient taking differently: Take 150 mg by mouth daily.), Disp: 135 tablet, Rfl: 3  Observations/Objective: Patient is well-developed, well-nourished in no acute distress.  Resting comfortably  at home.  Head is normocephalic, atraumatic.  No labored breathing.  Speech is clear and coherent with logical content.  Patient is alert and oriented at baseline.    Assessment and Plan:   1. Acute viral pharyngitis (Primary)  - lidocaine  (XYLOCAINE ) 2 % solution; Use as directed 15 mLs in the mouth or throat as needed for mouth pain.  Dispense: 100 mL; Refill: 0 - benzonatate  (TESSALON ) 100 MG capsule; Take 1 capsule (100 mg total) by mouth 3 (three) times daily as needed for cough.  Dispense: 30 capsule; Refill: 0 - promethazine -dextromethorphan (PROMETHAZINE -DM) 6.25-15 MG/5ML syrup; Take 2.5 mLs by mouth 4 (four) times daily as needed for cough.  Dispense: 118 mL; Refill: 0   -Suspect viral pharyngitis -  Discussed OTC management - Will prescribe Viscous lidocaine  for symptomatic relief - May use tylenol  and ibuprofen  alternating every 3-4 hours as needed for pain, fevers, body aches - Salt water  gargles - Hydration and voice rest - Seek in person evaluation if worsening or fails to improve    Reviewed side effects, risks and benefits of medication.    Patient acknowledged agreement and understanding of the plan.   Past Medical, Surgical, Social History, Allergies, and Medications have been Reviewed.    Follow Up Instructions: I discussed the assessment and treatment plan with the patient. The patient was provided an opportunity to ask questions and all were answered. The patient agreed with the plan and demonstrated an understanding of the instructions.  A copy of instructions were sent to the patient via MyChart unless otherwise noted below.    The patient was advised to call back or seek an in-person evaluation if the symptoms worsen or if the condition fails to improve as  anticipated.    Chiquita [name], NP

## 2024-01-22 ENCOUNTER — Other Ambulatory Visit

## 2024-01-22 ENCOUNTER — Encounter: Payer: Self-pay | Admitting: Internal Medicine

## 2024-01-22 ENCOUNTER — Ambulatory Visit: Admitting: Internal Medicine

## 2024-01-22 VITALS — BP 122/76 | HR 60 | Resp 16 | Ht 65.0 in | Wt 213.0 lb

## 2024-01-22 DIAGNOSIS — Z85850 Personal history of malignant neoplasm of thyroid: Secondary | ICD-10-CM | POA: Diagnosis not present

## 2024-01-22 DIAGNOSIS — E890 Postprocedural hypothyroidism: Secondary | ICD-10-CM

## 2024-01-22 NOTE — Progress Notes (Signed)
 Name: [name]  MRN/ DOB: [id number], [birth year]    Age/ Sex: 68 y.o., female    PCP: Oris Camie BRAVO, NP   Reason for Endocrinology Evaluation: MNG     Date of Initial Endocrinology Evaluation: 06/07/2021    HPI: [name] is a 68 y.o. female with a past medical history of Asthma, MNG. The patient presented for initial endocrinology clinic visit on 06/07/2021 for consultative assistance with her MNG.    During evaluation for chronic anemia she was seen by GI and was noted to have a hiatal hernia.  A CT scan was ordered for further evaluation of hiatal hernia and the patient had an incidental 2.2 cm left thyroid  nodule, which prompted a thyroid  ultrasound showing multinodular goiter.  Per patient she has been diagnosed with multinodular goiter in 2002 She was on LT-for replacement for many years before this was stopped by a physician    She is S/P FNA of the right mid 4.5 cm nodule with a cytology report consistent with suspicious for malignancy ( Bethesda Category V) . 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  09/2021: Tg 0.1 (TgAb <1) 01/2022: Tg 0.2 (TgAB) < 1) 01/2022: US  right resection bed hypoechoic 5 x 6 x 5 mm, left resection bed 12 x 5 x 5 mm 07/17/2022: Tg 0.1 (Ab negative) 07/24/2022: US : Right hypoechoic nodule decreased in size from 0.6 cm to 0.4 cm, left resection bed nodule no longer visualized 01/17/2023: Tg 0.1 (Ab neg) 01/17/2023: US : Stable 4 mm right thyroid  bed nodule  Switched to Levothyroxine  at night, as that has worked better for her    SUBJECTIVE:   Today (01/22/24):  [name] is here for a follow up on PTC.    She is S/P total thyroidectomy 07/05/2021  Since her last visit here, she had right hip arthroplasty in July, 2025  Weight continues to  increase No local neck swelling  No palpitations  No tremors  No constipation or diarrhea   Levothyroxine  150 mcg daily  Vitamin D  2000 international unit  Vitamin B Complex    HISTORY:  Past Medical History:  Past Medical History:  Diagnosis Date   Allergy    Anemia    Anxiety    Arthritis    Asthma    Cancer (HCC) 2023   Thyroid    Depression    Dyspnea    GERD (gastroesophageal reflux disease)    Goiter    History of hiatal hernia    Hypertension    Hypothyroidism    Pain in joint of left shoulder 12/11/2021   Past Surgical History:  Past Surgical History:  Procedure Laterality Date   CHOLECYSTECTOMY     FOOT SURGERY     HAMMER TOE SURGERY Bilateral    All toes on both feet   JOINT REPLACEMENT     KNEE SURGERY Left    TKA x2    1974, 2014   SHOULDER SURGERY Bilateral    Left Hemiarthroplasty,  total arthroplasty on Right shoulder   THYROIDECTOMY N/A 07/05/2021   Procedure: TOTAL THYROIDECTOMY;  Surgeon: Stevie Herlene Righter, MD;  Location: [facility];  Service: General;  Laterality: N/A;   TOTAL HIP ARTHROPLASTY Left    TOTAL HIP ARTHROPLASTY Right 09/18/2023  Procedure: ARTHROPLASTY, HIP, TOTAL, ANTERIOR APPROACH;  Surgeon: Ernie Cough, MD;  Location: [facility];  Service: Orthopedics;  Laterality: Right;   TOTAL KNEE ARTHROPLASTY Right 11/28/2020   Procedure: TOTAL KNEE ARTHROPLASTY;  Surgeon: Ernie Cough, MD;  Location: [facility];  Service: Orthopedics;  Laterality: Right;   TOTAL SHOULDER ARTHROPLASTY Left 02/08/2022   Procedure: SHOULDER REVISION TO TOTAL SHOULDER ARTHROPLASTY;  Surgeon: Kay Kemps, MD;  Location: [facility];  Service: Orthopedics;  Laterality: Left;  120 MIN CHOICE WITH INTERSCALENE BLOCK   WISDOM TOOTH EXTRACTION     XI ROBOTIC ASSISTED HIATAL HERNIA REPAIR N/A 08/20/2021   Procedure: XI ROBOTIC ASSISTED HIATAL HERNIA REPAIR WITH PARTIAL FUNDOPLICATION AND EGD;  Surgeon: [name], Herlene Righter, MD;  Location: [facility];  Service: General;   Laterality: N/A;    Social History:  reports that she quit smoking about 51 years ago. Her smoking use included cigarettes. She started smoking about 55 years ago. She has a 4 pack-year smoking history. She has never used smokeless tobacco. She reports current alcohol use. She reports that she does not use drugs. Family History: family history includes Alcohol abuse in her father and sister; Arthritis in her paternal grandmother; Breast cancer in her maternal grandmother; Diabetes in her mother; Drug abuse in her sister; Emphysema in her paternal grandmother; Heart disease in her mother; Hypertension in her mother; Obesity in her father; Varicose Veins in her mother; Vision loss in her mother.   HOME MEDICATIONS: Allergies as of 01/22/2024       Reactions   Mango Flavor [flavoring Agent (non-screening)] Anaphylaxis   Specifically mango peel , patient can eat the fruit itself   Oxycontin [oxycodone] Other (See Comments)   Dizziness        Medication List        Accurate as of January 22, 2024  8:39 AM. If you have any questions, ask your nurse or doctor.          STOP taking these medications    acetaminophen  650 MG CR tablet Commonly known as: TYLENOL  Stopped by: Kimbley Sprague J Rafi Kenneth   benzonatate  100 MG capsule Commonly known as: TESSALON  Stopped by: Donell PARAS Lemario Chaikin   ipratropium 0.03 % nasal spray Commonly known as: ATROVENT  Stopped by: Donell PARAS Kristion Holifield   lidocaine  2 % solution Commonly known as: XYLOCAINE  Stopped by: Donell PARAS Bethaney Oshana   methocarbamol  500 MG tablet Commonly known as: ROBAXIN  Stopped by: Donell PARAS Gladys Deckard   polyethylene glycol powder 17 GM/SCOOP powder Commonly known as: GLYCOLAX /MIRALAX  Stopped by: Donell PARAS Kees Idrovo   promethazine -dextromethorphan 6.25-15 MG/5ML syrup Commonly known as: PROMETHAZINE -DM Stopped by: Kunal Levario J Navika Hoopes       TAKE these medications    albuterol  108 (90 Base) MCG/ACT inhaler Commonly  known as: VENTOLIN  HFA Inhale 2 puffs into the lungs every 6 (six) hours as needed for wheezing or shortness of breath.   ALPRAZolam  0.25 MG tablet Commonly known as: XANAX  TAKE 1/2 TO 1 TABLET BY MOUTH UP TO 2 TIMES A DAY AS NEEDED FOR PANIC / ANXIETY What changed:  how much to take how to take this when to take this reasons to take this additional instructions   b complex vitamins capsule Take 1 capsule by mouth daily.   Breztri  Aerosphere 160-9-4.8 MCG/ACT Aero inhaler Generic drug: budesonide -glycopyrrolate -formoterol  Two puffs twice a day for maintenance. What changed:  how much to take how to take this when to take this additional instructions   busPIRone  7.5 MG tablet Commonly known as: BUSPAR   Take 1 tablet (7.5 mg total) by mouth 2 (two) times daily.   cetirizine  10 MG tablet Commonly known as: ZYRTEC  Take 1 tablet (10 mg total) by mouth 2 (two) times daily. What changed: when to take this   fluticasone  50 MCG/ACT nasal spray Commonly known as: FLONASE  Place 1 spray into both nostrils daily.   hydrochlorothiazide  25 MG tablet Commonly known as: HYDRODIURIL  Take 1 tablet (25 mg total) by mouth every morning.   HYDROmorphone  2 MG tablet Commonly known as: DILAUDID  Take 1 tablet (2 mg total) by mouth every 4 (four) hours as needed for severe pain (pain score 7-10).   levothyroxine  150 MCG tablet Commonly known as: SYNTHROID  Take 1 tablet (150 mcg total) by mouth daily.   sertraline  100 MG tablet Commonly known as: ZOLOFT  TAKE 1.5 TABLETS BY MOUTH EVERY MORNING What changed:  how much to take how to take this when to take this additional instructions   vitamin C  1000 MG tablet Take 1,000 mg by mouth daily.   Vitamin D  125 MCG (5000 UT) Caps Take 5,000 Units by mouth daily.          REVIEW OF SYSTEMS: A comprehensive ROS was conducted with the patient and is negative except as per HPI    OBJECTIVE:  VS:BP 122/76   Pulse 60   Resp 16    Ht 5' 5 (1.651 m)   Wt 213 lb (96.6 kg)   LMP 08/08/2010   SpO2 98%   BMI 35.45 kg/m    Wt Readings from Last 3 Encounters:  01/22/24 213 lb (96.6 kg)  09/18/23 206 lb (93.4 kg)  09/11/23 206 lb (93.4 kg)     EXAM: General: Pt appears well and is in NAD  Neck: General: Supple without adenopathy. Thyroid : no nodules appreciated   Lungs: Clear with good BS bilat   Heart: Auscultation: RRR.  Abdomen: soft, nontender  Extremities:  BL LE: No pretibial edema   Mental Status: Judgment, insight: Intact Orientation: Oriented to time, place, and person Mood and affect: No depression, anxiety, or agitation     DATA REVIEWED:   Latest Reference Range & Units 01/22/24 09:29  TSH 0.40 - 4.50 mIU/L 0.49     Latest Reference Range & Units 01/17/23 08:11  Thyroglobulin ng/mL 0.1 (L)  Thyroglobulin Ab < or = 1 IU/mL <1      SURGICAL PATHOLOGY 07/05/2021  A. THYROID , TOTAL THYROIDECTOMY:  Papillary carcinoma, oncocytic variant, of right lobe (see comment)  Tumor measures 3.8 x 3.1 x 2.1 cm  Margins free  Background of multinodular goiter with dominant nodule within the left  lobe   Comment:  Sections of the right thyroid  show a lobulated circumscribed thinly  encapsulated nodule composed of variably sized follicular groups showing  follicular cells with abundant granular cytoplasm all within a fibrotic  focally hyalinized fibrovascular stroma.  These cells show a variable  but moderate nuclear enlargement and cytologic atypia.  Many of the  cells show enlarged nuclei with clearing and margination of the nuclear  chromatin and scattered small inconspicuous nucleoli.  Although no  overlap is seen, the cells show frequent nuclear grooves, and nuclear  pseudoinclusions are readily identified.  There is scant associated  colloid.  Scattered calcifications and psammoma bodies are seen.  These  findings are consistent with a papillary carcinoma with oncocytic  features.    ONCOLOGY TABLE   THYROID  GLAND, CARCINOMA: Resection   Procedure: Total thyroidectomy  Tumor Focality: Unifocal  Tumor Site:  Right lobe  Tumor Size: 3.8 x 3.1 x 2.1 cm  Histologic Type: Papillary carcinoma, oncocytic variant  Angioinvasion: Not identified  Lymphatic Invasion: Not identified  Extrathyroidal Extension: Not identified  Margin Status: All margins negative for invasive carcinoma  Regional Lymph Node Status: Not applicable (no regional lymph nodes  submitted or found)  Distant Metastasis: Not applicable  Pathologic Stage Classification (pTNM, AJCC 8th Edition): pT2, pN n/a    Thyroid  bed ultrasound 07/24/2023 The thyroid  gland is surgically absent. No evidence of abnormality in the left thyroid  resection bed. A tiny focus of soft tissue is again noted in the right resection bed and remains stable at 0.4 x 0.4 x 0.2 cm. On today's examination, there is an appearance of a fatty hilum in the central aspect of this focus of soft tissue suggesting that it represents a small lymph node.   No other abnormality identified.   IMPRESSION: 1. Stable surgical changes of total thyroidectomy. 2. Unchanged small soft tissue nodule in the right thyroid  resection bed. On today's examination, the sonographic appearance suggests that this is a small lymph node.    ASSESSMENT/PLAN/RECOMMENDATIONS:   Papillary thyroid  carcinoma:   -Patient is S/P  total thyroidectomy on Jul 05, 2021, oncocytic variant, unifocal, 3.8 cm at max diameter, angioinvasion not identified, NO lymph nodes where submitted -Given oncocytic variant and in review of the literature there has been significant controversy about the biological behavior and optimal management of oncocytic- PTC variant but overall they have similar prognosis to non- oncocytic PTC.  - RAI ablation use in the setting of oncocytic PTC is variable due to the suggestion that there is limited uptake of RAI and therefore limited  benefit -TSH goal 0.5-2 u IU/mL -We will proceed with TG, TG AB, historically these have been undetectable -Thyroid  bed ultrasound 07/2022 showed decrease in size of the right thyroid  bed nodule, with disappearance of the left thyroid  bed nodule.  Repeat thyroid  bed ultrasound in May, 2025 continues to show stability of soft tissue nodule in the right thyroid  resection bed, which was revealed to be a lymph node. -Will repeat thyroid  bed ultrasound  2.  Postoperative hypothyroidism  -Patient is clinically euthyroid - TSH at goal - No changes   Medication Levothyroxine  150 mcg daily   F/U in 6 months    Signed electronically by: Stefano Redgie Butts, MD  [facility]  [facility] [address] [place], [place] [address] Phone: [phone] FAX: [phone]   CC: Oris Camie BRAVO, NP [address] [place] [address] Phone: [phone] Fax: [phone]   Return to Endocrinology clinic as below: Future Appointments  Date Time Provider Department Center  02/03/2024  8:10 AM [facility]-ANNUAL WELLNESS VISIT [facility] [id number]     And vitamin D  are normal

## 2024-01-23 ENCOUNTER — Ambulatory Visit
Admission: RE | Admit: 2024-01-23 | Discharge: 2024-01-23 | Disposition: A | Discharge status: Home / Self Care | Source: Ambulatory Visit | Attending: Internal Medicine | Admitting: Internal Medicine

## 2024-01-23 ENCOUNTER — Ambulatory Visit: Payer: Self-pay | Admitting: Internal Medicine

## 2024-01-23 DIAGNOSIS — Z85850 Personal history of malignant neoplasm of thyroid: Secondary | ICD-10-CM

## 2024-01-23 MED ORDER — LEVOTHYROXINE SODIUM 150 MCG PO TABS: 150.0000 ug | ORAL_TABLET | Freq: Every day | ORAL | 3 refills | Status: AC

## 2024-01-26 LAB — THYROGLOBULIN LEVEL: Thyroglobulin: 0.1 ng/mL — ABNORMAL LOW

## 2024-01-26 LAB — THYROGLOBULIN ANTIBODY: Thyroglobulin Ab: <1 [IU]/mL (ref <=1)

## 2024-01-26 LAB — TSH: TSH: 0.49 m[IU]/L (ref 0.40–4.50)

## 2024-02-03 ENCOUNTER — Ambulatory Visit: Payer: Medicare PPO

## 2024-02-03 VITALS — Ht 64.5 in | Wt 208.0 lb

## 2024-02-03 DIAGNOSIS — Z0000 Encounter for general adult medical examination without abnormal findings: Secondary | ICD-10-CM

## 2024-02-03 NOTE — Patient Instructions (Signed)
 [name],  Thank you for taking the time for your Medicare Wellness Visit. I appreciate your continued commitment to your health goals. Please review the care plan we discussed, and feel free to reach out if I can assist you further.  Please note that Annual Wellness Visits do not include a physical exam. Some assessments may be limited, especially if the visit was conducted virtually. If needed, we may recommend an in-person follow-up with your provider.  Ongoing Care Seeing your primary care provider every 3 to 6 months helps us  monitor your health and provide consistent, personalized care.   Referrals If a referral was made during today's visit and you haven't received any updates within two weeks, please contact the referred provider directly to check on the status.  Recommended Screenings:  Health Maintenance  Topic Date Due   Zoster (Shingles) Vaccine (1 of 2) Never done   COVID-19 Vaccine (5 - 2025-26 season) 11/03/2023   Medicare Annual Wellness Visit  01/28/2024   DTaP/Tdap/Td vaccine (1 - Tdap) 02/13/2024*   Pneumococcal Vaccine for age over 4 (1 of 2 - PCV) 02/13/2024*   Breast Cancer Screening  04/10/2025   Colon Cancer Screening  03/23/2031   Flu Shot  Completed   Osteoporosis screening with Bone Density Scan  Completed   Meningitis B Vaccine  Aged Out   Hepatitis C Screening  Discontinued  *Topic was postponed. The date shown is not the original due date.       01/30/2024   10:51 AM  Advanced Directives  Does Patient Have a Medical Advance Directive? Yes  Type of Estate Agent of [place];Living will  Does patient want to make changes to medical advance directive? No - Patient declined  Copy of Healthcare Power of Attorney in Chart? No - copy requested    Vision: Annual vision screenings are recommended for early detection of glaucoma, cataracts, and diabetic retinopathy. These exams can also reveal signs of chronic conditions such as  diabetes and high blood pressure.  Dental: Annual dental screenings help detect early signs of oral cancer, gum disease, and other conditions linked to overall health, including heart disease and diabetes.  Please see the attached documents for additional preventive care recommendations.

## 2024-02-03 NOTE — Progress Notes (Signed)
 Chief Complaint  Patient presents with   Medicare Wellness     Subjective:   [name] is a 68 y.o. female who presents for a Medicare Annual Wellness Visit.  Visit info / Clinical Intake: Medicare Wellness Visit Type:: Subsequent Annual Wellness Visit Persons participating in visit and providing information:: patient Medicare Wellness Visit Mode:: Video Since this visit was completed virtually, some vitals may be partially provided or unavailable. Missing vitals are due to the limitations of the virtual format.: Documented vitals are patient reported If Telephone or Video please confirm:: I connected with patient using audio/video enable telemedicine. I verified patient identity with two identifiers, discussed telehealth limitations, and patient agreed to proceed. Patient Location:: home Provider Location:: office Interpreter Needed?: No Pre-visit prep was completed: yes AWV questionnaire completed by patient prior to visit?: yes Date:: 01/30/24 Living arrangements:: lives with spouse/significant other Patient's Overall Health Status Rating: good Typical amount of pain: none Does pain affect daily life?: no Are you currently prescribed opioids?: no  Dietary Habits and Nutritional Risks How many meals a day?: 2 Eats fruit and vegetables daily?: yes Most meals are obtained by: preparing own meals In the last 2 weeks, have you had any of the following?: none Diabetic:: no  Functional Status Activities of Daily Living (to include ambulation/medication): (Patient-Rptd) Independent Ambulation: (Patient-Rptd) Independent Medication Administration: Independent Home Management (perform basic housework or laundry): (Patient-Rptd) Independent Manage your own finances?: yes Primary transportation is: driving Concerns about vision?: no *vision screening is required for WTM* Concerns about hearing?: no  Fall Screening Falls in the past year?: 1 (running through hospital,  missed a step up) Number of falls in past year: (Patient-Rptd) 1 Was there an injury with Fall?: 1 (black eyes) Fall Risk Category Calculator: (Patient-Rptd) 3 Patient Fall Risk Level: (Patient-Rptd) High Fall Risk  Fall Risk Patient at Risk for Falls Due to: History of fall(s); Medication side effect Fall risk Follow up: Falls evaluation completed; Falls prevention discussed  Home and Transportation Safety: All rugs have non-skid backing?: yes All stairs or steps have railings?: yes Grab bars in the bathtub or shower?: (!) no Have non-skid surface in bathtub or shower?: yes Good home lighting?: yes Regular seat belt use?: yes Hospital stays in the last year:: (!) yes How many hospital stays:: 1 Reason: hip surgery  Cognitive Assessment Difficulty concentrating, remembering, or making decisions? : no Will 6CIT or Mini Cog be Completed: yes What year is it?: 0 points What month is it?: 0 points Give patient an address phrase to remember (5 components): [address] About what time is it?: 0 points Count backwards from 20 to 1: 0 points Say the months of the year in reverse: 0 points Repeat the address phrase from earlier: 2 points 6 CIT Score: 2 points  Advance Directives (For Healthcare) Does Patient Have a Medical Advance Directive?: Yes Does patient want to make changes to medical advance directive?: No - Patient declined Type of Advance Directive: Healthcare Power of Amaya; Living will Copy of Healthcare Power of Attorney in Chart?: No - copy requested Copy of Living Will in Chart?: No - copy requested  Reviewed/Updated  Reviewed/Updated: Reviewed All (Medical, Surgical, Family, Medications, Allergies, Care Teams, Patient Goals)    Allergies (verified) Mango flavor [flavoring agent (non-screening)] and Oxycontin [oxycodone]   Current Medications (verified) Outpatient Encounter Medications as of 02/03/2024  Medication Sig   albuterol  (VENTOLIN  HFA) 108  (90 Base) MCG/ACT inhaler Inhale 2 puffs into the lungs  every 6 (six) hours as needed for wheezing or shortness of breath.   ALPRAZolam  (XANAX ) 0.25 MG tablet TAKE 1/2 TO 1 TABLET BY MOUTH UP TO 2 TIMES A DAY AS NEEDED FOR PANIC / ANXIETY (Patient taking differently: Take 0.25 mg by mouth 2 (two) times daily as needed for anxiety or sleep.)   Ascorbic Acid  (VITAMIN C ) 1000 MG tablet Take 1,000 mg by mouth daily.   b complex vitamins capsule Take 1 capsule by mouth daily.   Budeson-Glycopyrrol-Formoterol  (BREZTRI  AEROSPHERE) 160-9-4.8 MCG/ACT AERO Two puffs twice a day for maintenance. (Patient taking differently: Inhale 2 puffs into the lungs in the morning and at bedtime.)   busPIRone  (BUSPAR ) 7.5 MG tablet Take 1 tablet (7.5 mg total) by mouth 2 (two) times daily.   cetirizine  (ZYRTEC ) 10 MG tablet Take 1 tablet (10 mg total) by mouth 2 (two) times daily.   Cholecalciferol  (VITAMIN D ) 125 MCG (5000 UT) CAPS Take 5,000 Units by mouth daily.   fluticasone  (FLONASE ) 50 MCG/ACT nasal spray Place 1 spray into both nostrils daily.   hydrochlorothiazide  (HYDRODIURIL ) 25 MG tablet Take 1 tablet (25 mg total) by mouth every morning.   levothyroxine  (SYNTHROID ) 150 MCG tablet Take 1 tablet (150 mcg total) by mouth daily.   sertraline  (ZOLOFT ) 100 MG tablet TAKE 1.5 TABLETS BY MOUTH EVERY MORNING (Patient taking differently: Take 150 mg by mouth daily.)   HYDROmorphone  (DILAUDID ) 2 MG tablet Take 1 tablet (2 mg total) by mouth every 4 (four) hours as needed for severe pain (pain score 7-10).   No facility-administered encounter medications on file as of 02/03/2024.    History: Past Medical History:  Diagnosis Date   Allergy    Anemia    Anxiety    Arthritis    Asthma    Cancer (HCC) 2023   Thyroid    Depression    Dyspnea    GERD (gastroesophageal reflux disease)    Goiter    History of hiatal hernia    Hypertension    Hypothyroidism    Pain in joint of left shoulder 12/11/2021   Past  Surgical History:  Procedure Laterality Date   CHOLECYSTECTOMY     FOOT SURGERY     HAMMER TOE SURGERY Bilateral    All toes on both feet   JOINT REPLACEMENT     KNEE SURGERY Left    TKA x2    1974, 2014   SHOULDER SURGERY Bilateral    Left Hemiarthroplasty,  total arthroplasty on Right shoulder   THYROIDECTOMY N/A 07/05/2021   Procedure: TOTAL THYROIDECTOMY;  Surgeon: Stevie Herlene Righter, MD;  Location: [facility];  Service: General;  Laterality: N/A;   TOTAL HIP ARTHROPLASTY Left    TOTAL HIP ARTHROPLASTY Right 09/18/2023   Procedure: ARTHROPLASTY, HIP, TOTAL, ANTERIOR APPROACH;  Surgeon: Ernie Cough, MD;  Location: [facility];  Service: Orthopedics;  Laterality: Right;   TOTAL KNEE ARTHROPLASTY Right 11/28/2020   Procedure: TOTAL KNEE ARTHROPLASTY;  Surgeon: Ernie Cough, MD;  Location: [facility];  Service: Orthopedics;  Laterality: Right;   TOTAL SHOULDER ARTHROPLASTY Left 02/08/2022   Procedure: SHOULDER REVISION TO TOTAL SHOULDER ARTHROPLASTY;  Surgeon: Kay Kemps, MD;  Location: [facility];  Service: Orthopedics;  Laterality: Left;  120 MIN CHOICE WITH INTERSCALENE BLOCK   WISDOM TOOTH EXTRACTION     XI ROBOTIC ASSISTED HIATAL HERNIA REPAIR N/A 08/20/2021   Procedure: XI ROBOTIC ASSISTED HIATAL HERNIA REPAIR WITH PARTIAL FUNDOPLICATION AND EGD;  Surgeon: Stevie Herlene Righter, MD;  Location: [facility];  Service: General;  Laterality: N/A;   Family History  Problem Relation Age of Onset   Heart disease Mother    Diabetes Mother    Hypertension Mother    Vision loss Mother    Varicose Veins Mother    Breast cancer Maternal Grandmother    Emphysema Paternal Grandmother        never smoked but chewed tobacco   Arthritis Paternal Grandmother    Alcohol abuse Father    Obesity Father    Alcohol abuse Sister    Drug abuse Sister    Social History   Occupational History   Not on file  Tobacco Use   Smoking status: Former    Current packs/day: 0.00    Average packs/day: 1 pack/day  for 4.0 years (4.0 ttl pk-yrs)    Types: Cigarettes    Start date: 64    Quit date: 77    Years since quitting: 51.9   Smokeless tobacco: Never  Vaping Use   Vaping status: Never Used  Substance and Sexual Activity   Alcohol use: Yes    Comment: Wine occasional   Drug use: No   Sexual activity: Not Currently    Birth control/protection: Post-menopausal   Tobacco Counseling Counseling given: Not Answered  SDOH Screenings   Food Insecurity: No Food Insecurity (01/30/2024)  Housing: Low Risk  (01/30/2024)  Transportation Needs: No Transportation Needs (01/30/2024)  Utilities: Not At Risk (02/03/2024)  Alcohol Screen: Low Risk  (01/30/2024)  Depression (PHQ2-9): Low Risk  (02/03/2024)  Financial Resource Strain: Low Risk  (01/30/2024)  Physical Activity: Insufficiently Active (01/30/2024)  Social Connections: Moderately Integrated (01/30/2024)  Stress: No Stress Concern Present (01/30/2024)  Tobacco Use: Medium Risk (02/03/2024)  Health Literacy: Adequate Health Literacy (02/03/2024)   See flowsheets for full screening details  Depression Screen PHQ 2 & 9 Depression Scale- Over the past 2 weeks, how often have you been bothered by any of the following problems? Little interest or pleasure in doing things: 0 Feeling down, depressed, or hopeless (PHQ Adolescent also includes...irritable): 0 PHQ-2 Total Score: 0 Trouble falling or staying asleep, or sleeping too much: 0 Feeling tired or having little energy: 1 Poor appetite or overeating (PHQ Adolescent also includes...weight loss): 0 Feeling bad about yourself - or that you are a failure or have let yourself or your family down: 0 Trouble concentrating on things, such as reading the newspaper or watching television (PHQ Adolescent also includes...like school work): 0 Moving or speaking so slowly that other people could have noticed. Or the opposite - being so fidgety or restless that you have been moving around a lot more than  usual: 0 Thoughts that you would be better off dead, or of hurting yourself in some way: 0 PHQ-9 Total Score: 1 If you checked off any problems, how difficult have these problems made it for you to do your work, take care of things at home, or get along with other people?: Not difficult at all  Depression Treatment Depression Interventions/Treatment : EYV7-0 Score <4 Follow-up Not Indicated     Goals Addressed             This Visit's Progress    Patient Stated       02/03/2024, get more fit and improve balance             Objective:    Today's Vitals   02/03/24 0801  Weight: 208 lb (94.3 kg)  Height: 5' 4.5 (1.638 m)   Body mass index  is 35.15 kg/m.  Hearing/Vision screen Hearing Screening - Comments:: Denies hearing issues Vision Screening - Comments:: Regular eye exams, Immunizations and Health Maintenance Health Maintenance  Topic Date Due   Zoster Vaccines- Shingrix (1 of 2) Never done   COVID-19 Vaccine (5 - 2025-26 season) 11/03/2023   DTaP/Tdap/Td (1 - Tdap) 02/13/2024 (Originally 06/09/1974)   Pneumococcal Vaccine: 50+ Years (1 of 2 - PCV) 02/13/2024 (Originally 06/09/1974)   Medicare Annual Wellness (AWV)  02/02/2025   Mammogram  04/10/2025   Colonoscopy  03/23/2031   Influenza Vaccine  Completed   Bone Density Scan  Completed   Meningococcal B Vaccine  Aged Out   Hepatitis C Screening  Discontinued        Assessment/Plan:  This is a routine wellness examination for Leathia.  Patient Care Team: Early, Camie BRAVO, NP as PCP - General (Nurse Practitioner) Sam, Donell Cardinal, MD as Attending Physician (Endocrinology)  I have personally reviewed and noted the following in the patient's chart:   Medical and social history Use of alcohol, tobacco or illicit drugs  Current medications and supplements including opioid prescriptions. Functional ability and status Nutritional status Physical activity Advanced directives List of other  physicians Hospitalizations, surgeries, and ER visits in previous 12 months Vitals Screenings to include cognitive, depression, and falls Referrals and appointments  No orders of the defined types were placed in this encounter.  In addition, I have reviewed and discussed with patient certain preventive protocols, quality metrics, and best practice recommendations. A written personalized care plan for preventive services as well as general preventive health recommendations were provided to patient.   Ardella BRAVO Dawn, LPN   [id number]   Return in 1 year (on 02/02/2025).  After Visit Summary: (MyChart) Due to this being a telephonic visit, the after visit summary with patients personalized plan was offered to patient via MyChart   Nurse Notes: none

## 2024-02-14 ENCOUNTER — Other Ambulatory Visit: Payer: Self-pay | Admitting: Nurse Practitioner

## 2024-02-14 DIAGNOSIS — J4540 Moderate persistent asthma, uncomplicated: Secondary | ICD-10-CM

## 2024-02-14 DIAGNOSIS — F413 Other mixed anxiety disorders: Secondary | ICD-10-CM

## 2024-02-16 NOTE — Telephone Encounter (Signed)
 Last appt 09/08/23

## 2024-03-18 ENCOUNTER — Other Ambulatory Visit: Payer: Self-pay | Admitting: Nurse Practitioner

## 2024-03-18 DIAGNOSIS — Z1231 Encounter for screening mammogram for malignant neoplasm of breast: Secondary | ICD-10-CM

## 2024-03-21 ENCOUNTER — Other Ambulatory Visit: Payer: Self-pay | Admitting: Nurse Practitioner

## 2024-03-21 DIAGNOSIS — F413 Other mixed anxiety disorders: Secondary | ICD-10-CM

## 2024-04-12 ENCOUNTER — Ambulatory Visit

## 2024-07-19 ENCOUNTER — Ambulatory Visit: Admitting: Internal Medicine

## 2024-08-06 ENCOUNTER — Encounter: Admitting: Nurse Practitioner
